# Patient Record
Sex: Female | Born: 1990 | Race: White | Hispanic: No | Smoking: Never smoker
Health system: Southern US, Community
[De-identification: ages and names within clinical notes are randomized; demographics above are authoritative.]

## PROBLEM LIST (undated history)

## (undated) ENCOUNTER — Ambulatory Visit (HOSPITAL_COMMUNITY): Admission: EM | Payer: No Typology Code available for payment source | Source: Home / Self Care

## (undated) DIAGNOSIS — N390 Urinary tract infection, site not specified: Secondary | ICD-10-CM

## (undated) DIAGNOSIS — E119 Type 2 diabetes mellitus without complications: Secondary | ICD-10-CM

## (undated) DIAGNOSIS — F419 Anxiety disorder, unspecified: Secondary | ICD-10-CM

## (undated) DIAGNOSIS — R519 Headache, unspecified: Secondary | ICD-10-CM

## (undated) DIAGNOSIS — F32A Depression, unspecified: Secondary | ICD-10-CM

## (undated) HISTORY — PX: LAPAROSCOPY: SHX197

---

## 2011-08-07 DIAGNOSIS — J302 Other seasonal allergic rhinitis: Secondary | ICD-10-CM

## 2011-08-07 HISTORY — DX: Other seasonal allergic rhinitis: J30.2

## 2020-04-13 DIAGNOSIS — S42409A Unspecified fracture of lower end of unspecified humerus, initial encounter for closed fracture: Secondary | ICD-10-CM

## 2020-04-13 HISTORY — DX: Unspecified fracture of lower end of unspecified humerus, initial encounter for closed fracture: S42.409A

## 2020-04-20 ENCOUNTER — Inpatient Hospital Stay (HOSPITAL_COMMUNITY)
Admission: EM | Admit: 2020-04-20 | Discharge: 2020-04-22 | DRG: 637 | Disposition: A | Discharge status: Home / Self Care | Payer: No Typology Code available for payment source | Attending: Family Medicine | Admitting: Family Medicine

## 2020-04-20 ENCOUNTER — Encounter (HOSPITAL_COMMUNITY): Payer: Self-pay | Admitting: *Deleted

## 2020-04-20 ENCOUNTER — Emergency Department (HOSPITAL_COMMUNITY): Payer: No Typology Code available for payment source

## 2020-04-20 ENCOUNTER — Other Ambulatory Visit: Payer: Self-pay

## 2020-04-20 DIAGNOSIS — Z91018 Allergy to other foods: Secondary | ICD-10-CM

## 2020-04-20 DIAGNOSIS — N390 Urinary tract infection, site not specified: Secondary | ICD-10-CM | POA: Diagnosis present

## 2020-04-20 DIAGNOSIS — Z794 Long term (current) use of insulin: Secondary | ICD-10-CM

## 2020-04-20 DIAGNOSIS — Z91030 Bee allergy status: Secondary | ICD-10-CM

## 2020-04-20 DIAGNOSIS — Z7984 Long term (current) use of oral hypoglycemic drugs: Secondary | ICD-10-CM

## 2020-04-20 DIAGNOSIS — Z888 Allergy status to other drugs, medicaments and biological substances status: Secondary | ICD-10-CM

## 2020-04-20 DIAGNOSIS — Z791 Long term (current) use of non-steroidal anti-inflammatories (NSAID): Secondary | ICD-10-CM

## 2020-04-20 DIAGNOSIS — U071 COVID-19: Secondary | ICD-10-CM | POA: Diagnosis present

## 2020-04-20 DIAGNOSIS — E1142 Type 2 diabetes mellitus with diabetic polyneuropathy: Secondary | ICD-10-CM | POA: Diagnosis present

## 2020-04-20 DIAGNOSIS — Z882 Allergy status to sulfonamides status: Secondary | ICD-10-CM

## 2020-04-20 DIAGNOSIS — E1110 Type 2 diabetes mellitus with ketoacidosis without coma: Secondary | ICD-10-CM | POA: Diagnosis present

## 2020-04-20 DIAGNOSIS — Z880 Allergy status to penicillin: Secondary | ICD-10-CM

## 2020-04-20 DIAGNOSIS — Z9114 Patient's other noncompliance with medication regimen: Secondary | ICD-10-CM

## 2020-04-20 DIAGNOSIS — Z79899 Other long term (current) drug therapy: Secondary | ICD-10-CM

## 2020-04-20 HISTORY — DX: Type 2 diabetes mellitus without complications: E11.9

## 2020-04-20 LAB — CBC
HCT: 44.3 % (ref 36.0–46.0)
HCT: 40.6 % (ref 36.0–46.0)
Hemoglobin: 15 g/dL (ref 12.0–15.0)
Hemoglobin: 14.2 g/dL (ref 12.0–15.0)
MCH: 28.7 pg (ref 26.0–34.0)
MCH: 29.5 pg (ref 26.0–34.0)
MCHC: 33.9 g/dL (ref 30.0–36.0)
MCHC: 35 g/dL (ref 30.0–36.0)
MCV: 84.9 fL (ref 80.0–100.0)
MCV: 84.2 fL (ref 80.0–100.0)
Platelets: 375 10*3/uL (ref 150–400)
Platelets: 345 10*3/uL (ref 150–400)
RBC: 5.22 MIL/uL — ABNORMAL HIGH (ref 3.87–5.11)
RBC: 4.82 MIL/uL (ref 3.87–5.11)
RDW: 12.7 % (ref 11.5–15.5)
RDW: 12.8 % (ref 11.5–15.5)
WBC: 22.7 10*3/uL — ABNORMAL HIGH (ref 4.0–10.5)
WBC: 21.3 10*3/uL — ABNORMAL HIGH (ref 4.0–10.5)
nRBC: 0 % (ref 0.0–0.2)
nRBC: 0 % (ref 0.0–0.2)

## 2020-04-20 LAB — BASIC METABOLIC PANEL
Anion gap: 13 (ref 5–15)
Anion gap: 13 (ref 5–15)
BUN: 8 mg/dL (ref 6–20)
BUN: 6 mg/dL (ref 6–20)
CO2: 24 mmol/L (ref 22–32)
CO2: 23 mmol/L (ref 22–32)
Calcium: 8.3 mg/dL — ABNORMAL LOW (ref 8.9–10.3)
Calcium: 8.3 mg/dL — ABNORMAL LOW (ref 8.9–10.3)
Chloride: 96 mmol/L — ABNORMAL LOW (ref 98–111)
Chloride: 100 mmol/L (ref 98–111)
Creatinine, Ser: 0.6 mg/dL (ref 0.44–1.00)
Creatinine, Ser: 0.57 mg/dL (ref 0.44–1.00)
GFR, Estimated: >60 mL/min (ref >60)
GFR, Estimated: >60 mL/min (ref >60)
Glucose, Bld: 227 mg/dL — ABNORMAL HIGH (ref 70–99)
Glucose, Bld: 198 mg/dL — ABNORMAL HIGH (ref 70–99)
Potassium: 3.4 mmol/L — ABNORMAL LOW (ref 3.5–5.1)
Potassium: 3.9 mmol/L (ref 3.5–5.1)
Sodium: 133 mmol/L — ABNORMAL LOW (ref 135–145)
Sodium: 136 mmol/L (ref 135–145)

## 2020-04-20 LAB — CBG MONITORING, ED
Glucose-Capillary: 350 mg/dL — ABNORMAL HIGH (ref 70–99)
Glucose-Capillary: 277 mg/dL — ABNORMAL HIGH (ref 70–99)
Glucose-Capillary: 218 mg/dL — ABNORMAL HIGH (ref 70–99)
Glucose-Capillary: 203 mg/dL — ABNORMAL HIGH (ref 70–99)
Glucose-Capillary: 179 mg/dL — ABNORMAL HIGH (ref 70–99)
Glucose-Capillary: 218 mg/dL — ABNORMAL HIGH (ref 70–99)
Glucose-Capillary: 214 mg/dL — ABNORMAL HIGH (ref 70–99)

## 2020-04-20 LAB — COMPREHENSIVE METABOLIC PANEL
ALT: 27 U/L (ref 0–44)
AST: 16 U/L (ref 15–41)
Albumin: 4 g/dL (ref 3.5–5.0)
Alkaline Phosphatase: 86 U/L (ref 38–126)
Anion gap: 18 — ABNORMAL HIGH (ref 5–15)
BUN: 10 mg/dL (ref 6–20)
CO2: 20 mmol/L — ABNORMAL LOW (ref 22–32)
Calcium: 8.7 mg/dL — ABNORMAL LOW (ref 8.9–10.3)
Chloride: 93 mmol/L — ABNORMAL LOW (ref 98–111)
Creatinine, Ser: 0.71 mg/dL (ref 0.44–1.00)
GFR, Estimated: >60 mL/min (ref >60)
Glucose, Bld: 407 mg/dL — ABNORMAL HIGH (ref 70–99)
Potassium: 4 mmol/L (ref 3.5–5.1)
Sodium: 131 mmol/L — ABNORMAL LOW (ref 135–145)
Total Bilirubin: 1.2 mg/dL (ref 0.3–1.2)
Total Protein: 8 g/dL (ref 6.5–8.1)

## 2020-04-20 LAB — URINALYSIS, ROUTINE W REFLEX MICROSCOPIC
Bilirubin Urine: NEGATIVE
Glucose, UA: >=500 mg/dL — AB
Ketones, ur: 80 mg/dL — AB
Leukocytes,Ua: NEGATIVE
Nitrite: NEGATIVE
Protein, ur: 100 mg/dL — AB
Specific Gravity, Urine: 1.024 (ref 1.005–1.030)
pH: 6 (ref 5.0–8.0)

## 2020-04-20 LAB — LIPASE, BLOOD: Lipase: 14 U/L (ref 11–51)

## 2020-04-20 LAB — BLOOD GAS, VENOUS
Acid-Base Excess: 0.3 mmol/L (ref 0.0–2.0)
Bicarbonate: 25.2 mmol/L (ref 20.0–28.0)
O2 Saturation: 57.7 %
Patient temperature: 98.6
pCO2, Ven: 43.7 mmHg — ABNORMAL LOW (ref 44.0–60.0)
pH, Ven: 7.379 (ref 7.250–7.430)
pO2, Ven: 32.5 mmHg (ref 32.0–45.0)

## 2020-04-20 LAB — POC SARS CORONAVIRUS 2 AG -  ED: SARS Coronavirus 2 Ag: POSITIVE — AB

## 2020-04-20 LAB — HEMOGLOBIN A1C
Hgb A1c MFr Bld: 11.7 % — ABNORMAL HIGH (ref 4.8–5.6)
Mean Plasma Glucose: 289.09 mg/dL

## 2020-04-20 LAB — I-STAT BETA HCG BLOOD, ED (MC, WL, AP ONLY): I-stat hCG, quantitative: <5 m[IU]/mL (ref <5)

## 2020-04-20 LAB — LACTIC ACID, PLASMA: Lactic Acid, Venous: 1.2 mmol/L (ref 0.5–1.9)

## 2020-04-20 LAB — BETA-HYDROXYBUTYRIC ACID
Beta-Hydroxybutyric Acid: 2.6 mmol/L — ABNORMAL HIGH (ref 0.05–0.27)
Beta-Hydroxybutyric Acid: 0.55 mmol/L — ABNORMAL HIGH (ref 0.05–0.27)

## 2020-04-20 LAB — HIV ANTIBODY (ROUTINE TESTING W REFLEX): HIV Screen 4th Generation wRfx: NONREACTIVE

## 2020-04-20 MED ORDER — POTASSIUM CHLORIDE 10 MEQ/100ML IV SOLN
10.0000 meq | INTRAVENOUS | Status: DC
  Administered 2020-04-20: 10 meq via INTRAVENOUS
  Filled 2020-04-20: qty 100

## 2020-04-20 MED ORDER — CEFTRIAXONE SODIUM 1 G IJ SOLR
1.0000 g | INTRAMUSCULAR | Status: AC
  Administered 2020-04-20 – 2020-04-22 (×3): 1 g via INTRAVENOUS
  Filled 2020-04-20 (×3): qty 10

## 2020-04-20 MED ORDER — SODIUM CHLORIDE 0.9 % IV SOLN: INTRAVENOUS | Status: DC

## 2020-04-20 MED ORDER — LACTATED RINGERS IV BOLUS: 20.0000 mL/kg | Freq: Once | INTRAVENOUS | Status: DC

## 2020-04-20 MED ORDER — INSULIN REGULAR(HUMAN) IN NACL 100-0.9 UT/100ML-% IV SOLN: INTRAVENOUS | Status: DC

## 2020-04-20 MED ORDER — SODIUM CHLORIDE 0.9 % IV SOLN
200.0000 mg | Freq: Once | INTRAVENOUS | Status: AC
  Administered 2020-04-20: 200 mg via INTRAVENOUS
  Filled 2020-04-20: qty 200

## 2020-04-20 MED ORDER — DEXTROSE 50 % IV SOLN: 0.0000 mL | INTRAVENOUS | Status: DC | PRN

## 2020-04-20 MED ORDER — LACTATED RINGERS IV SOLN: INTRAVENOUS | Status: DC

## 2020-04-20 MED ORDER — POTASSIUM CHLORIDE 10 MEQ/100ML IV SOLN: 10.0000 meq | INTRAVENOUS | Status: DC

## 2020-04-20 MED ORDER — DEXTROSE IN LACTATED RINGERS 5 % IV SOLN: INTRAVENOUS | Status: DC

## 2020-04-20 MED ORDER — SODIUM CHLORIDE 0.9 % IV SOLN
100.0000 mg | Freq: Every day | INTRAVENOUS | Status: DC
  Administered 2020-04-21 – 2020-04-22 (×2): 100 mg via INTRAVENOUS
  Filled 2020-04-20 (×3): qty 20

## 2020-04-20 MED ORDER — LACTATED RINGERS IV BOLUS
20.0000 mL/kg | Freq: Once | INTRAVENOUS | Status: AC
  Administered 2020-04-20: 1706 mL via INTRAVENOUS

## 2020-04-20 MED ORDER — INSULIN REGULAR(HUMAN) IN NACL 100-0.9 UT/100ML-% IV SOLN
INTRAVENOUS | Status: DC
  Administered 2020-04-20: 13 [IU]/h via INTRAVENOUS
  Filled 2020-04-20: qty 100

## 2020-04-20 MED ORDER — ENOXAPARIN SODIUM 40 MG/0.4ML ~~LOC~~ SOLN
40.0000 mg | SUBCUTANEOUS | Status: DC
  Administered 2020-04-20: 40 mg via SUBCUTANEOUS
  Filled 2020-04-20: qty 0.4

## 2020-04-20 MED ORDER — INSULIN DETEMIR 100 UNIT/ML ~~LOC~~ SOLN
10.0000 [IU] | Freq: Every day | SUBCUTANEOUS | Status: DC
  Administered 2020-04-20 – 2020-04-21 (×2): 10 [IU] via SUBCUTANEOUS
  Filled 2020-04-20 (×4): qty 0.1

## 2020-04-20 MED ORDER — INSULIN REGULAR HUMAN 100 UNIT/ML IJ SOLN
0.0000 [IU] | Freq: Four times a day (QID) | INTRAMUSCULAR | Status: DC
  Administered 2020-04-20: 3 [IU] via SUBCUTANEOUS
  Administered 2020-04-21 – 2020-04-22 (×3): 8 [IU] via SUBCUTANEOUS
  Administered 2020-04-22 (×2): 3 [IU] via SUBCUTANEOUS
  Filled 2020-04-20: qty 3
  Filled 2020-04-20: qty 10

## 2020-04-20 MED ORDER — POTASSIUM CHLORIDE 10 MEQ/100ML IV SOLN
10.0000 meq | INTRAVENOUS | Status: AC
  Administered 2020-04-20 (×3): 10 meq via INTRAVENOUS
  Filled 2020-04-20 (×3): qty 100

## 2020-04-20 NOTE — ED Notes (Signed)
Pt. CBG 218, RN, Junior made aware.

## 2020-04-20 NOTE — ED Notes (Signed)
Insulin drip decrease to 6.5 per endo tool guidelines

## 2020-04-20 NOTE — ED Notes (Signed)
Insulin drip decreased to 10cc/hr per endo tool guidlines

## 2020-04-20 NOTE — ED Triage Notes (Signed)
Covid + confirmed by home test. Headache, fever, chest pain emesis, unable to keep food or fluids in.

## 2020-04-20 NOTE — ED Notes (Signed)
ED Provider at bedside. 

## 2020-04-20 NOTE — ED Provider Notes (Signed)
[facility] DEPT Provider Note   CSN: [id number] Arrival date & time: 04/20/20  1008     History Chief Complaint  Patient presents with  . Chest Pain  . Emesis  . Fever    Deborah Pierce is a 30 y.o. female.  30 year old female who was diagnosed with Covid yesterday who presents with emesis and chest comfort.  States she has been short of breath.  Can keeping food down.  Patient has been currently treated for UTI as well.  Has history of type 2 diabetes and has not been taking her insulin or oral medications.  Denies any abdominal discomfort.  No urinary symptoms.  Denies any polydipsia.  Does endorse increased weakness as well as dyspnea exertion.  She is not vaccinated against Covid        Past Medical History:  Diagnosis Date  . Diabetes mellitus without complication ([place])     There are no problems to display for this patient.   Past Surgical History:  Procedure Laterality Date  . LAPAROSCOPY       OB History   No obstetric history on file.     No family history on file.  Social History   Tobacco Use  . Smoking status: Never Smoker  . Smokeless tobacco: Never Used  Substance Use Topics  . Alcohol use: Never  . Drug use: Never    Home Medications Prior to Admission medications   Not on File    Allergies    Fruit & vegetable daily [black cohosh-soyisoflav-c quad], Bee venom, Insulins, Penicillins, and Sulfa antibiotics  Review of Systems   Review of Systems  All other systems reviewed and are negative.   Physical Exam Updated Vital Signs BP 129/87 (BP Location: Left Arm)   Pulse (!) 112   Temp 99.3 F (37.4 C) (Oral)   Resp 20   Ht 1.715 m (5' 7.5")   Wt 85.3 kg   SpO2 97%   BMI 29.01 kg/m   Physical Exam Vitals and nursing note reviewed.  Constitutional:      General: She is not in acute distress.    Appearance: Normal appearance. She is well-developed and well-nourished. She is not toxic-appearing.   HENT:     Head: Normocephalic and atraumatic.  Eyes:     General: Lids are normal.     Extraocular Movements: EOM normal.     Conjunctiva/sclera: Conjunctivae normal.     Pupils: Pupils are equal, round, and reactive to light.  Neck:     Thyroid: No thyroid mass.     Trachea: No tracheal deviation.  Cardiovascular:     Rate and Rhythm: Regular rhythm. Tachycardia present.     Heart sounds: Normal heart sounds. No murmur heard. No gallop.   Pulmonary:     Effort: Pulmonary effort is normal. No respiratory distress.     Breath sounds: Normal breath sounds. No stridor. No decreased breath sounds, wheezing, rhonchi or rales.  Abdominal:     General: Bowel sounds are normal. There is no distension.     Palpations: Abdomen is soft.     Tenderness: There is no abdominal tenderness. There is no CVA tenderness or rebound.  Musculoskeletal:        General: No tenderness or edema. Normal range of motion.     Cervical back: Normal range of motion and neck supple.  Skin:    General: Skin is warm and dry.     Findings: No abrasion or rash.  Neurological:  Mental Status: She is alert and oriented to person, place, and time.     GCS: GCS eye subscore is 4. GCS verbal subscore is 5. GCS motor subscore is 6.     Cranial Nerves: No cranial nerve deficit.     Sensory: No sensory deficit.     Deep Tendon Reflexes: Strength normal.  Psychiatric:        Mood and Affect: Mood and affect normal.        Speech: Speech normal.        Behavior: Behavior normal.     ED Results / Procedures / Treatments   Labs (all labs ordered are listed, but only abnormal results are displayed) Labs Reviewed  COMPREHENSIVE METABOLIC PANEL - Abnormal; Notable for the following components:      Result Value   Sodium 131 (*)    Chloride 93 (*)    CO2 20 (*)    Glucose, Bld 407 (*)    Calcium 8.7 (*)    Anion gap 18 (*)    All other components within normal limits  CBC - Abnormal; Notable for the following  components:   WBC 22.7 (*)    RBC 5.22 (*)    All other components within normal limits  URINALYSIS, ROUTINE W REFLEX MICROSCOPIC - Abnormal; Notable for the following components:   Glucose, UA >=500 (*)    Hgb urine dipstick SMALL (*)    Ketones, ur 80 (*)    Protein, ur 100 (*)    Bacteria, UA RARE (*)    All other components within normal limits  LIPASE, BLOOD  BETA-HYDROXYBUTYRIC ACID  BETA-HYDROXYBUTYRIC ACID  BLOOD GAS, VENOUS  I-STAT BETA HCG BLOOD, ED (MC, [facility], AP ONLY)  I-STAT BETA HCG BLOOD, ED ([facility], [facility], [facility] ONLY)  CBG MONITORING, ED  POC SARS CORONAVIRUS 2 AG -  ED    EKG None  Radiology No results found.  Procedures Procedures (including critical care time)  Medications Ordered in ED Medications  lactated ringers bolus 1,706 mL (has no administration in time range)  lactated ringers infusion (has no administration in time range)  dextrose 5 % in lactated ringers infusion (has no administration in time range)  dextrose 50 % solution 0-50 mL (has no administration in time range)  potassium chloride 10 mEq in 100 mL IVPB (has no administration in time range)  cefTRIAXone (ROCEPHIN) 1 g in sodium chloride 0.9 % 100 mL IVPB (has no administration in time range)    ED Course  I have reviewed the triage vital signs and the nursing notes.  Pertinent labs & imaging results that were available during my care of the patient were reviewed by me and considered in my medical decision making (see chart for details).    MDM Rules/Calculators/A&P                          Patient's urinalysis is consistent with infection.  She has known history of UTI.  She has significant leukocytosis.  Started on Rocephin.  We will add blood cultures and lactate.  Chest x-ray without acute findings.  Patient has mild DKA and after consultation with pharmacy patient started on insulin drip.  Given IV fluids.  Will admit to the hospital service  CRITICAL CARE Performed by: Leota Jacobsen Total critical care time: 50 minutes Critical care time was exclusive of separately billable procedures and treating other patients. Critical care was necessary to treat or prevent imminent  or life-threatening deterioration. Critical care was time spent personally by me on the following activities: development of treatment plan with patient and/or surrogate as well as nursing, discussions with consultants, evaluation of patient's response to treatment, examination of patient, obtaining history from patient or surrogate, ordering and performing treatments and interventions, ordering and review of laboratory studies, ordering and review of radiographic studies, pulse oximetry and re-evaluation of patient's condition.  Final Clinical Impression(s) / ED Diagnoses Final diagnoses:  None    Rx / DC Orders ED Discharge Orders    None       Lacretia Leigh, MD 04/20/20 1457

## 2020-04-20 NOTE — H&P (Addendum)
TRH H&P    Patient Demographics:    Deborah Pierce, is a 30 y.o. female  MRN: [id number]  DOB - [birth year]  Admit Date - 04/20/2020  Referring MD/NP/PA: Lacretia Leigh   Outpatient Primary MD for the patient is Aquilla Hacker, MD  Patient coming from: Home  Chief complaint-vomiting, fever   HPI:    Deborah Pierce  is a 30 y.o. female, with history of diabetes mellitus type 2, peripheral neuropathy who came to hospital with complaints of vomiting, fever, chest discomfort.  Patient states that she started having shortness of breath yesterday.  Also complains of dysuria.  Patient had ongoing symptoms of generalized malaise and fatigue for past few days, yesterday she did at home testing for COVID-19 which came back positive.  She was seen by her PCP by televisit, and he recommended patient to take antibiotics for UTI.  She denies coughing up any phlegm.  Denies chest pain.  Complains of abdominal pain. She does not take insulin at home.  She is not on any medications for diabetes at home. Patient is not vaccinated against COVID-19. In the ED chest x-ray was unremarkable, lab work showed patient was in mild DKA with anion gap of 18. She complains of dysuria Complains of abdominal pain     Review of systems:    In addition to the HPI above,   All other systems reviewed and are negative.    Past History of the following :    Past Medical History:  Diagnosis Date  . Diabetes mellitus without complication [facility])       Past Surgical History:  Procedure Laterality Date  . LAPAROSCOPY        Social History:      Social History   Tobacco Use  . Smoking status: Never Smoker  . Smokeless tobacco: Never Used  Substance Use Topics  . Alcohol use: Never       Family History :   Family history of breast cancer on mother side Family history of lung cancer on father side   Home Medications:    Prior to Admission medications   Medication Sig Start Date End Date Taking? Authorizing Provider  acetaminophen (TYLENOL) 500 MG tablet Take 500 mg by mouth every 6 (six) hours as needed for moderate pain.   Yes [provider]  albuterol (VENTOLIN HFA) 108 (90 Base) MCG/ACT inhaler Inhale 1 puff into the lungs every 6 (six) hours as needed for wheezing or shortness of breath. 03/13/16  Yes [provider]  FLUoxetine (PROZAC) 20 MG capsule Take 20 mg by mouth daily.   Yes [provider]  gabapentin (NEURONTIN) 300 MG capsule Take 600 mg by mouth at bedtime. 03/24/20  Yes [provider]  hydrOXYzine (ATARAX/VISTARIL) 25 MG tablet Take 25 mg by mouth daily as needed for anxiety or itching.   Yes [provider]  ibuprofen (ADVIL) 200 MG tablet Take 800 mg by mouth every 6 (six) hours as needed for mild pain.   Yes [provider]  meloxicam (MOBIC) 15 MG  tablet Take 15 mg by mouth daily. 03/23/20  Yes [provider]     Allergies:     Allergies  Allergen Reactions  . Fruit & Vegetable Daily [Black Cohosh-Soyisoflav-C Quad] Anaphylaxis    Apples, oraganges, kiwi, peaches  . Penicillins Anaphylaxis    swelling  . Insulins Nausea Only and Rash    Insulin glargine, degludec, aspart    . Bee Venom Rash    unknown  . Sulfa Antibiotics Rash     Physical Exam:   Vitals  Blood pressure 102/85, pulse (!) 105, temperature 99.4 F (37.4 C), temperature source Oral, resp. rate 18, height 5' 7.5" (1.715 m), weight 85.3 kg, SpO2 95 %.  1.  General: Appears in mild distress  2. Psychiatric: Alert, oriented x3, intact insight and judgment  3. Neurologic: Cranial nerves II through XII grossly intact, no focal deficit noted  4. HEENMT:  Atraumatic normocephalic, extraocular muscles are intact  5. Respiratory : Clear to auscultation bilaterally, no wheezing or crackles auscultated  6. Cardiovascular : S1-S2, regular,  no murmur auscultated  7. Gastrointestinal:  Abdomen is soft, nontender, no organomegaly  8. Skin:  No rashes noted    Data Review:    CBC Recent Labs  Lab 04/20/20 1145  WBC 22.7*  HGB 15.0  HCT 44.3  PLT 375  MCV 84.9  MCH 28.7  MCHC 33.9  RDW 12.7   ------------------------------------------------------------------------------------------------------------------  Results for orders placed or performed during the hospital encounter of 04/20/20 (from the past 48 hour(s))  Urinalysis, Routine w reflex microscopic Urine, Clean Catch     Status: Abnormal   Collection Time: 04/20/20 10:31 AM  Result Value Ref Range   Color, Urine YELLOW YELLOW   APPearance CLEAR CLEAR   Specific Gravity, Urine 1.024 1.005 - 1.030   pH 6.0 5.0 - 8.0   Glucose, UA >=500 (A) NEGATIVE mg/dL   Hgb urine dipstick SMALL (A) NEGATIVE   Bilirubin Urine NEGATIVE NEGATIVE   Ketones, ur 80 (A) NEGATIVE mg/dL   Protein, ur 100 (A) NEGATIVE mg/dL   Nitrite NEGATIVE NEGATIVE   Leukocytes,Ua NEGATIVE NEGATIVE   RBC / HPF 0-5 0 - 5 RBC/hpf   WBC, UA 21-50 0 - 5 WBC/hpf   Bacteria, UA RARE (A) NONE SEEN   Mucus PRESENT     Comment: Performed at [facility], [place] [address], [place], [place] [address]  Lipase, blood     Status: None   Collection Time: 04/20/20 11:45 AM  Result Value Ref Range   Lipase 14 11 - 51 U/L    Comment: Performed at [facility], [place] [address] [place], [place] [address]  Comprehensive metabolic panel     Status: Abnormal   Collection Time: 04/20/20 11:45 AM  Result Value Ref Range   Sodium 131 (L) 135 - 145 mmol/L   Potassium 4.0 3.5 - 5.1 mmol/L   Chloride 93 (L) 98 - 111 mmol/L   CO2 20 (L) 22 - 32 mmol/L   Glucose, Bld 407 (H) 70 - 99 mg/dL    Comment: Glucose reference range applies only to samples taken after fasting for at least 8 hours.   BUN 10 6 - 20 mg/dL   Creatinine, Ser 0.71 0.44 - 1.00 mg/dL   Calcium 8.7 (L) 8.9 -  10.3 mg/dL   Total Protein 8.0 6.5 - 8.1 g/dL   Albumin 4.0 3.5 - 5.0 g/dL   AST 16 15 - 41 U/L   ALT 27  0 - 44 U/L   Alkaline Phosphatase 86 38 - 126 U/L   Total Bilirubin 1.2 0.3 - 1.2 mg/dL   GFR, Estimated >60 >60 mL/min    Comment: (NOTE) Calculated using the CKD-EPI Creatinine Equation (2021)    Anion gap 18 (H) 5 - 15    Comment: Performed at [facility], [place] [address], [place], [place] [address]  CBC     Status: Abnormal   Collection Time: 04/20/20 11:45 AM  Result Value Ref Range   WBC 22.7 (H) 4.0 - 10.5 K/uL   RBC 5.22 (H) 3.87 - 5.11 MIL/uL   Hemoglobin 15.0 12.0 - 15.0 g/dL   HCT 44.3 36.0 - 46.0 %   MCV 84.9 80.0 - 100.0 fL   MCH 28.7 26.0 - 34.0 pg   MCHC 33.9 30.0 - 36.0 g/dL   RDW 12.7 11.5 - 15.5 %   Platelets 375 150 - 400 K/uL   nRBC 0.0 0.0 - 0.2 %    Comment: Performed at [facility], [place] [address] [place], [place] [address]  Beta-hydroxybutyric acid     Status: Abnormal   Collection Time: 04/20/20 11:45 AM  Result Value Ref Range   Beta-Hydroxybutyric Acid 2.60 (H) 0.05 - 0.27 mmol/L    Comment: Performed at [facility], [place] [address], [place], [place] [address]  I-Stat beta hCG blood, ED     Status: None   Collection Time: 04/20/20 11:52 AM  Result Value Ref Range   I-stat hCG, quantitative <5.0 <5 mIU/mL   Comment 3            Comment:   GEST. AGE      CONC.  (mIU/mL)   <=1 WEEK        5 - 50     2 WEEKS       50 - 500     3 WEEKS       100 - 10,000     4 WEEKS     1,000 - 30,000        FEMALE AND NON-PREGNANT FEMALE:     LESS THAN 5 mIU/mL   CBG monitoring, ED     Status: Abnormal   Collection Time: 04/20/20  2:59 PM  Result Value Ref Range   Glucose-Capillary 350 (H) 70 - 99 mg/dL    Comment: Glucose reference range applies only to samples taken after fasting for at least 8 hours.   Comment 1 Notify RN    Comment 2 Document in Chart   Blood gas, venous     Status: Abnormal    Collection Time: 04/20/20  3:35 PM  Result Value Ref Range   pH, Ven 7.379 7.250 - 7.430   pCO2, Ven 43.7 (L) 44.0 - 60.0 mmHg   pO2, Ven 32.5 32.0 - 45.0 mmHg   Bicarbonate 25.2 20.0 - 28.0 mmol/L   Acid-Base Excess 0.3 0.0 - 2.0 mmol/L   O2 Saturation 57.7 %   Patient temperature 98.6     Comment: Performed at [facility], [place] [address], [place], [place] [address]  Lactic acid, plasma     Status: None   Collection Time: 04/20/20  3:35 PM  Result Value Ref Range   Lactic Acid, Venous 1.2 0.5 - 1.9 mmol/L    Comment: Performed at [facility], [place] [address], [place],  [address]  CBG monitoring, ED     Status: Abnormal   Collection Time: 04/20/20  4:01 PM  Result Value Ref Range   Glucose-Capillary 277 (H) 70 - 99 mg/dL    Comment: Glucose reference range applies only to samples taken after fasting for at least 8 hours.   Comment 1 Notify RN    Comment 2 Document in Chart     Chemistries  Recent Labs  Lab 04/20/20 1145  NA 131*  K 4.0  CL 93*  CO2 20*  GLUCOSE 407*  BUN 10  CREATININE 0.71  CALCIUM 8.7*  AST 16  ALT 27  ALKPHOS 86  BILITOT 1.2   ------------------------------------------------------------------------------------------------------------------  ------------------------------------------------------------------------------------------------------------------ GFR: Estimated Creatinine Clearance: 117.6 mL/min (by C-G formula based on SCr of 0.71 mg/dL). Liver Function Tests: Recent Labs  Lab 04/20/20 1145  AST 16  ALT 27  ALKPHOS 86  BILITOT 1.2  PROT 8.0  ALBUMIN 4.0   Recent Labs  Lab 04/20/20 1145  LIPASE 14   CBG: Recent Labs  Lab 04/20/20 1459 04/20/20 1601  GLUCAP 350* 277*    --------------------------------------------------------------------------------------------------------------- Urine analysis:    Component Value Date/Time   COLORURINE YELLOW 04/20/2020 1031    APPEARANCEUR CLEAR 04/20/2020 1031   LABSPEC 1.024 04/20/2020 1031   PHURINE 6.0 04/20/2020 1031   GLUCOSEU >=500 (A) 04/20/2020 1031   HGBUR SMALL (A) 04/20/2020 1031   BILIRUBINUR NEGATIVE 04/20/2020 1031   KETONESUR 80 (A) 04/20/2020 1031   PROTEINUR 100 (A) 04/20/2020 1031   NITRITE NEGATIVE 04/20/2020 1031   LEUKOCYTESUR NEGATIVE 04/20/2020 1031      Imaging Results:    DG Chest Port 1 View  Result Date: 04/20/2020 CLINICAL DATA:  COVID-19 positive.  Cough. EXAM: PORTABLE CHEST 1 VIEW COMPARISON:  None. FINDINGS: The heart size and mediastinal contours are within normal limits. Both lungs are clear. The visualized skeletal structures are unremarkable. IMPRESSION: No active disease. Electronically Signed   By: Dorise Bullion III M.D   On: 04/20/2020 14:20    My personal review of EKG: Rhythm NSR, no ST-T changes   Assessment & Plan:    Active Problems:   DKA (diabetic ketoacidosis) ([place])   1. Diabetic ketoacidosis-patient presented with vomiting, fever, lab work showed positive beta hydroxybutyrate, positive anion gap of 18.  Will start patient on DKA protocol, check BMP every 4 hours per protocol.  IV insulin.  We will switch IV fluids to D5 half-normal saline once blood sugar is less than 250.  Will replace potassium as needed. 2. Positive COVID-19-patient did at home test of COVID-19 which was positive, chest x-ray is unremarkable.  She is not requiring oxygen.  Repeat COVID-19 test is positive in the ED.  We will start her on remdesivir per pharmacy consultation. 3. UTI-patient was diagnosed with UTI, complaint of dysuria.  Will obtain urine culture, continue empiric ceftriaxone.  Follow urine culture results.    DVT Prophylaxis-   Lovenox   AM Labs Ordered, also please review Full Orders  Family Communication: Admission, patients condition and plan of care including tests being ordered have been discussed with the patient and  who indicate understanding and agree with  the plan and Code Status.  Code Status: Full code  Admission status: Observation  Time spent in minutes : 60 minutes   Garo Heidelberg S Ilir Mahrt M.D

## 2020-04-21 DIAGNOSIS — N390 Urinary tract infection, site not specified: Secondary | ICD-10-CM | POA: Diagnosis present

## 2020-04-21 DIAGNOSIS — Z882 Allergy status to sulfonamides status: Secondary | ICD-10-CM | POA: Diagnosis not present

## 2020-04-21 DIAGNOSIS — Z880 Allergy status to penicillin: Secondary | ICD-10-CM | POA: Diagnosis not present

## 2020-04-21 DIAGNOSIS — Z888 Allergy status to other drugs, medicaments and biological substances status: Secondary | ICD-10-CM | POA: Diagnosis not present

## 2020-04-21 DIAGNOSIS — E11649 Type 2 diabetes mellitus with hypoglycemia without coma: Secondary | ICD-10-CM | POA: Diagnosis not present

## 2020-04-21 DIAGNOSIS — E1142 Type 2 diabetes mellitus with diabetic polyneuropathy: Secondary | ICD-10-CM | POA: Diagnosis present

## 2020-04-21 DIAGNOSIS — Z91030 Bee allergy status: Secondary | ICD-10-CM | POA: Diagnosis not present

## 2020-04-21 DIAGNOSIS — Z91018 Allergy to other foods: Secondary | ICD-10-CM | POA: Diagnosis not present

## 2020-04-21 DIAGNOSIS — Z9114 Patient's other noncompliance with medication regimen: Secondary | ICD-10-CM | POA: Diagnosis not present

## 2020-04-21 DIAGNOSIS — Z794 Long term (current) use of insulin: Secondary | ICD-10-CM | POA: Diagnosis not present

## 2020-04-21 DIAGNOSIS — Z79899 Other long term (current) drug therapy: Secondary | ICD-10-CM | POA: Diagnosis not present

## 2020-04-21 DIAGNOSIS — Z791 Long term (current) use of non-steroidal anti-inflammatories (NSAID): Secondary | ICD-10-CM | POA: Diagnosis not present

## 2020-04-21 DIAGNOSIS — U071 COVID-19: Secondary | ICD-10-CM | POA: Diagnosis present

## 2020-04-21 DIAGNOSIS — E1110 Type 2 diabetes mellitus with ketoacidosis without coma: Secondary | ICD-10-CM | POA: Diagnosis present

## 2020-04-21 DIAGNOSIS — Z7984 Long term (current) use of oral hypoglycemic drugs: Secondary | ICD-10-CM | POA: Diagnosis not present

## 2020-04-21 LAB — BASIC METABOLIC PANEL
Anion gap: 13 (ref 5–15)
BUN: 8 mg/dL (ref 6–20)
CO2: 23 mmol/L (ref 22–32)
Calcium: 8.1 mg/dL — ABNORMAL LOW (ref 8.9–10.3)
Chloride: 100 mmol/L (ref 98–111)
Creatinine, Ser: 0.61 mg/dL (ref 0.44–1.00)
GFR, Estimated: >60 mL/min (ref >60)
Glucose, Bld: 332 mg/dL — ABNORMAL HIGH (ref 70–99)
Potassium: 3.8 mmol/L (ref 3.5–5.1)
Sodium: 136 mmol/L (ref 135–145)

## 2020-04-21 LAB — GLUCOSE, CAPILLARY: Glucose-Capillary: 264 mg/dL — ABNORMAL HIGH (ref 70–99)

## 2020-04-21 LAB — CBC
HCT: 38.7 % (ref 36.0–46.0)
Hemoglobin: 12.8 g/dL (ref 12.0–15.0)
MCH: 28.8 pg (ref 26.0–34.0)
MCHC: 33.1 g/dL (ref 30.0–36.0)
MCV: 87.2 fL (ref 80.0–100.0)
Platelets: 312 10*3/uL (ref 150–400)
RBC: 4.44 MIL/uL (ref 3.87–5.11)
RDW: 13 % (ref 11.5–15.5)
WBC: 14.1 10*3/uL — ABNORMAL HIGH (ref 4.0–10.5)
nRBC: 0 % (ref 0.0–0.2)

## 2020-04-21 LAB — CBG MONITORING, ED
Glucose-Capillary: 183 mg/dL — ABNORMAL HIGH (ref 70–99)
Glucose-Capillary: 251 mg/dL — ABNORMAL HIGH (ref 70–99)
Glucose-Capillary: 254 mg/dL — ABNORMAL HIGH (ref 70–99)
Glucose-Capillary: 291 mg/dL — ABNORMAL HIGH (ref 70–99)

## 2020-04-21 LAB — BETA-HYDROXYBUTYRIC ACID
Beta-Hydroxybutyric Acid: 0.57 mmol/L — ABNORMAL HIGH (ref 0.05–0.27)
Beta-Hydroxybutyric Acid: 0.77 mmol/L — ABNORMAL HIGH (ref 0.05–0.27)

## 2020-04-21 LAB — SARS CORONAVIRUS 2 (TAT 6-24 HRS): SARS Coronavirus 2: POSITIVE — AB

## 2020-04-21 MED ORDER — ONDANSETRON HCL 4 MG/2ML IJ SOLN
4.0000 mg | Freq: Three times a day (TID) | INTRAMUSCULAR | Status: DC | PRN
  Administered 2020-04-21 (×2): 4 mg via INTRAVENOUS
  Filled 2020-04-21 (×2): qty 2

## 2020-04-21 MED ORDER — ACETAMINOPHEN 325 MG PO TABS
650.0000 mg | ORAL_TABLET | Freq: Four times a day (QID) | ORAL | Status: DC | PRN
  Administered 2020-04-21 – 2020-04-22 (×6): 650 mg via ORAL
  Filled 2020-04-21 (×6): qty 2

## 2020-04-21 NOTE — Progress Notes (Signed)
[facility]  PROGRESS NOTE  Deborah Pierce TKZ:[id number] DOB: 1990/08/25 DOA: 04/20/2020 PCP: Aquilla Hacker, MD   Brief HPI:   30 year old female with medical history of diabetes mellitus type 2, peripheral neuropathy came to hospital with vomiting, fever, chest discomfort.  She was found to be positive for COVID-19.  She was also found to be in DKA started on DKA protocol with IV insulin.  She was started on IV remdesivir.  Chest x-ray was unremarkable.    Subjective   Patient seen and examined, she is off of IV insulin.  Complaint of vomiting this morning.  Started on carb modified diet.   Assessment/Plan:     1. Diabetic ketoacidosis-patient presented with DKA, was started on DKA protocol with IV insulin.  Anion gap has closed.  IV insulin has been discontinued.  Bicarb was 23 this morning.  Patient started on Levemir and sliding scale insulin with NovoLog. 2. Diabetes mellitus type 2-patient will be discharged home on Levemir 10 units subcu daily along with metformin 500 mg p.o. twice daily. 3. UTI-patient complained of dysuria, started on IV ceftriaxone.  Urine culture is currently pending.  We will follow urine culture results. 4. Positive COVID-19-patient was positive for COVID-19, she did not receive vaccine.  Started on remdesivir per pharmacy.  Chest x-ray is unremarkable.  Patient is not requiring oxygen.  O2 sats 95% on room air.     COVID-19 Labs  No results for input(s): DDIMER, FERRITIN, LDH, CRP in the last 72 hours.  Lab Results  Component Value Date   SARSCOV2NAA POSITIVE (A) 04/20/2020     Scheduled medications:   . insulin detemir  10 Units Subcutaneous QHS  . insulin regular  0-15 Units Subcutaneous Q6H         CBG: Recent Labs  Lab 04/20/20 2249 04/20/20 2354 04/21/20 0554 04/21/20 0758 04/21/20 1147  GLUCAP 214* 183* 251* 291* 254*    SpO2: 95 %    CBC: Recent Labs  Lab 04/20/20 1145 04/20/20 1653 04/21/20 1223  WBC 22.7*  21.3* 14.1*  HGB 15.0 14.2 12.8  HCT 44.3 40.6 38.7  MCV 84.9 84.2 87.2  PLT 375 345 573    Basic Metabolic Panel: Recent Labs  Lab 04/20/20 1145 04/20/20 1653 04/20/20 2050 04/21/20 0530  NA 131* 133* 136 136  K 4.0 3.4* 3.9 3.8  CL 93* 96* 100 100  CO2 20* 24 23 23   GLUCOSE 407* 227* 198* 332*  BUN 10 8 6 8   CREATININE 0.71 0.60 0.57 0.61  CALCIUM 8.7* 8.3* 8.3* 8.1*     Liver Function Tests: Recent Labs  Lab 04/20/20 1145  AST 16  ALT 27  ALKPHOS 86  BILITOT 1.2  PROT 8.0  ALBUMIN 4.0     Antibiotics: Anti-infectives (From admission, onward)   Start     Dose/Rate Route Frequency Ordered Stop   04/21/20 1000  remdesivir 100 mg in sodium chloride 0.9 % 100 mL IVPB        100 mg 200 mL/hr over 30 Minutes Intravenous Daily 04/20/20 1712 04/25/20 0959   04/20/20 1800  remdesivir 200 mg in sodium chloride 0.9% 250 mL IVPB        200 mg 580 mL/hr over 30 Minutes Intravenous Once 04/20/20 1712 04/20/20 1849   04/20/20 1400  cefTRIAXone (ROCEPHIN) 1 g in sodium chloride 0.9 % 100 mL IVPB        1 g 200 mL/hr over 30 Minutes Intravenous Every 24 hours 04/20/20 1356  DVT prophylaxis: Lovenox  Code Status: Full code  Family Communication: No family at bedside   Consultants:    Procedures:      Objective   Vitals:   04/21/20 1030 04/21/20 1130 04/21/20 1200 04/21/20 1420  BP: (!) 148/91 139/90 (!) 142/93 139/85  Pulse: 95 89 87 82  Resp: 18  18 18   Temp:      TempSrc:      SpO2: 95%  95% 95%  Weight:      Height:        Intake/Output Summary (Last 24 hours) at 04/21/2020 1508 Last data filed at 04/21/2020 1345 Gross per 24 hour  Intake 173.69 ml  Output 1000 ml  Net -826.31 ml    01/07 1901 - 01/09 0700 In: 273.7 [I.V.:73.7] Out: -   Filed Weights   04/20/20 1028  Weight: 85.3 kg    Physical Examination:    General-appears in no acute distress  Heart-S1-S2, regular, no murmur auscultated  Lungs-clear to  auscultation bilaterally, no wheezing or crackles auscultated  Abdomen-soft, nontender, no organomegaly  Extremities-no edema in the lower extremities  Neuro-alert, oriented x3, no focal deficit noted   Status is: Inpatient  Dispo: The patient is from: Home              Anticipated d/c is to: Home              Anticipated d/c date is: 04/22/2020              Patient currently not stable for discharge  Barrier to discharge-awaiting urine culture results            Data Reviewed:   Recent Results (from the past 240 hour(s))  SARS CORONAVIRUS 2 (TAT 6-24 HRS) Nasopharyngeal Nasopharyngeal Swab     Status: Abnormal   Collection Time: 04/20/20  8:54 PM   Specimen: Nasopharyngeal Swab  Result Value Ref Range Status   SARS Coronavirus 2 POSITIVE (A) NEGATIVE Final    Comment: (NOTE) SARS-CoV-2 target nucleic acids are DETECTED.  The SARS-CoV-2 RNA is generally detectable in upper and lower respiratory specimens during the acute phase of infection. Positive results are indicative of the presence of SARS-CoV-2 RNA. Clinical correlation with patient history and other diagnostic information is  necessary to determine patient infection status. Positive results do not rule out bacterial infection or co-infection with other viruses.  The expected result is Negative.  Fact Sheet for Patients: SugarRoll.be  Fact Sheet for Healthcare Providers: https://www.woods-mathews.com/  This test is not yet approved or cleared by the [place] FDA and  has been authorized for detection and/or diagnosis of SARS-CoV-2 by FDA under an Emergency Use Authorization (EUA). This EUA will remain  in effect (meaning this test can be used) for the duration of the COVID-19 declaration under Section 564(b)(1) of the Act, 21 U. S.C. section 360bbb-3(b)(1), unless the authorization is terminated or revoked sooner.   Performed at [facility],  [place] [address] [place], [place] [address]     Recent Labs  Lab 04/20/20 1145  LIPASE 14    Studies:  DG Chest Port 1 View  Result Date: 04/20/2020 CLINICAL DATA:  COVID-19 positive.  Cough. EXAM: PORTABLE CHEST 1 VIEW COMPARISON:  None. FINDINGS: The heart size and mediastinal contours are within normal limits. Both lungs are clear. The visualized skeletal structures are unremarkable. IMPRESSION: No active disease. Electronically Signed   By: Dorise Bullion III M.D   On: 04/20/2020 14:20  Meredeth Ide   [facility] If 7PM-7AM, please contact night-coverage at [email or web address], Office  [phone]   04/21/2020, 3:08 PM  LOS: 0 days

## 2020-04-22 DIAGNOSIS — E11649 Type 2 diabetes mellitus with hypoglycemia without coma: Secondary | ICD-10-CM | POA: Diagnosis not present

## 2020-04-22 DIAGNOSIS — Z794 Long term (current) use of insulin: Secondary | ICD-10-CM

## 2020-04-22 DIAGNOSIS — E1110 Type 2 diabetes mellitus with ketoacidosis without coma: Secondary | ICD-10-CM | POA: Diagnosis not present

## 2020-04-22 LAB — GLUCOSE, CAPILLARY
Glucose-Capillary: 193 mg/dL — ABNORMAL HIGH (ref 70–99)
Glucose-Capillary: 196 mg/dL — ABNORMAL HIGH (ref 70–99)
Glucose-Capillary: 267 mg/dL — ABNORMAL HIGH (ref 70–99)

## 2020-04-22 LAB — URINE CULTURE

## 2020-04-22 LAB — BASIC METABOLIC PANEL
Anion gap: 13 (ref 5–15)
BUN: 9 mg/dL (ref 6–20)
CO2: 25 mmol/L (ref 22–32)
Calcium: 8.5 mg/dL — ABNORMAL LOW (ref 8.9–10.3)
Chloride: 102 mmol/L (ref 98–111)
Creatinine, Ser: 0.56 mg/dL (ref 0.44–1.00)
GFR, Estimated: >60 mL/min (ref >60)
Glucose, Bld: 304 mg/dL — ABNORMAL HIGH (ref 70–99)
Potassium: 3.5 mmol/L (ref 3.5–5.1)
Sodium: 140 mmol/L (ref 135–145)

## 2020-04-22 MED ORDER — INSULIN ASPART 100 UNIT/ML FLEXPEN: PEN_INJECTOR | SUBCUTANEOUS | 11 refills | Status: DC

## 2020-04-22 MED ORDER — BLOOD GLUCOSE MONITOR KIT
PACK | 0 refills | Status: DC
Start: 2020-04-22 — End: 2022-05-31

## 2020-04-22 MED ORDER — INSULIN DETEMIR 100 UNIT/ML FLEXPEN: 15.0000 [IU] | PEN_INJECTOR | Freq: Every day | SUBCUTANEOUS | 2 refills | Status: DC

## 2020-04-22 MED ORDER — METFORMIN HCL 500 MG PO TABS: 500.0000 mg | ORAL_TABLET | Freq: Two times a day (BID) | ORAL | 11 refills | Status: DC

## 2020-04-22 MED ORDER — "INSULIN SYRINGE 31G X 5/16"" 0.5 ML MISC": 0 refills | Status: DC

## 2020-04-22 NOTE — Discharge Instructions (Signed)
Home isolation till 04/29/2020.   COVID-19 Quarantine vs. Isolation QUARANTINE keeps someone who was in close contact with someone who has COVID-19 away from others. Quarantine if you have been in close contact with someone who has COVID-19, unless you have been fully vaccinated. If you are fully vaccinated  You do NOT need to quarantine unless they have symptoms  Get tested 3-5 days after your exposure, even if you don't have symptoms  Wear a mask indoors in public for 14 days following exposure or until your test result is negative If you are not fully vaccinated  Stay home for 14 days after your last contact with a person who has COVID-19  Watch for fever (100.9F), cough, shortness of breath, or other symptoms of COVID-19  If possible, stay away from people you live with, especially people who are at higher risk for getting very sick from COVID-19  Contact your local public health department for options in your area to possibly shorten your quarantine ISOLATION keeps someone who is sick or tested positive for COVID-19 without symptoms away from others, even in their own home. People who are in isolation should stay home and stay in a specific "sick room" or area and use a separate bathroom (if available). If you are sick and think or know you have COVID-19 Stay home until after  At least 10 days since symptoms first appeared and  At least 24 hours with no fever without the use of fever-reducing medications and  Symptoms have improved If you tested positive for COVID-19 but do not have symptoms  Stay home until after 10 days have passed since your positive viral test  If you develop symptoms after testing positive, follow the steps above for those who are sick [email or web address] 01/08/2020 This information is not intended to replace advice given to you by your health care provider. Make sure you discuss any questions you have with your health care provider. Document Revised:  02/12/2020 Document Reviewed: 02/12/2020 Elsevier Patient Education  2021 [employer].

## 2020-04-22 NOTE — Discharge Summary (Addendum)
Physician Discharge Summary  Deborah Pierce WNI:[id number] DOB: 1991/03/10 DOA: 04/20/2020  PCP: Aquilla Hacker, MD  Admit date: 04/20/2020 Discharge date: 04/22/2020  Time spent: 50 minutes  Recommendations for Outpatient Follow-up:  1. Follow-up PCP in 2 weeks 2. Continue home isolation till 04/29/2020  Discharge Diagnoses:  Active Problems:   DKA (diabetic ketoacidosis) ([place]) Uncontrolled diabetes mellitus type 2    Discharge Condition: Stable  Diet recommendation: Stable  Filed Weights   04/20/20 1028  Weight: 85.3 kg    History of present illness:  30 year old female with medical history of diabetes mellitus type 2, peripheral neuropathy came to hospital with vomiting, fever, chest discomfort.  She was found to be positive for COVID-19.  She was also found to be in DKA started on DKA protocol with IV insulin.  She was started on IV remdesivir.  Chest x-ray was unremarkable.   Hospital Course:   1. Diabetic ketoacidosis-patient presented with DKA, was started on DKA protocol with IV insulin.  Anion gap has closed.  IV insulin has been discontinued.  Bicarb was 23 this morning.  Patient started on Levemir and sliding scale insulin with NovoLog.   2. Diabetes mellitus type 2-patient will be discharged home on Levemir 15 units subcu daily along with metformin 500 mg p.o. twice daily.  We will start NovoLog moderate sliding scale. 3.  ? UTI-patient complained of dysuria, started on IV ceftriaxone.  Urine culture grew multiple species.  Patient received 3 days of antibiotics in the hospital.  Will not discharge on antibiotics at this time.  4. Positive COVID-19-patient was positive for COVID-19, she did not receive vaccine.  Started on remdesivir per pharmacy.  Chest x-ray is unremarkable.  Patient is not requiring oxygen.  O2 sats 95% on room air.  Patient will be discharged home without any steroids.  She has received remdesivir x3 doses in the  hospital.   Procedures:    Consultations:    Discharge Exam: Vitals:   04/22/20 0619 04/22/20 1252  BP: 123/83 135/88  Pulse: 77 76  Resp: 15 16  Temp: 98.2 F (36.8 C) 98.5 F (36.9 C)  SpO2: 96% 95%    General: Appears in no acute distress Cardiovascular: S1-S2, regular Respiratory: Clear to auscultation bilaterally  Discharge Instructions   Discharge Instructions    Diet - low sodium heart healthy   Complete by: As directed    Increase activity slowly   Complete by: As directed      Allergies as of 04/22/2020      Reactions   Fruit & Vegetable Daily [black Cohosh-soyisoflav-c Quad] Anaphylaxis   Apples, oraganges, kiwi, peaches   Penicillins Anaphylaxis   swelling   Insulins Nausea Only, Rash   Insulin glargine, degludec, aspart   Bee Venom Rash   unknown   Sulfa Antibiotics Rash      Medication List    STOP taking these medications   ibuprofen 200 MG tablet Commonly known as: ADVIL   meloxicam 15 MG tablet Commonly known as: MOBIC     TAKE these medications   acetaminophen 500 MG tablet Commonly known as: TYLENOL Take 500 mg by mouth every 6 (six) hours as needed for moderate pain.   albuterol 108 (90 Base) MCG/ACT inhaler Commonly known as: VENTOLIN HFA Inhale 1 puff into the lungs every 6 (six) hours as needed for wheezing or shortness of breath.   blood glucose meter kit and supplies Kit Dispense based on patient and insurance preference. Use up to four times daily  as directed. (FOR ICD-9 250.00, 250.01).   FLUoxetine 20 MG capsule Commonly known as: PROZAC Take 20 mg by mouth daily.   gabapentin 300 MG capsule Commonly known as: NEURONTIN Take 600 mg by mouth at bedtime.   hydrOXYzine 25 MG tablet Commonly known as: ATARAX/VISTARIL Take 25 mg by mouth daily as needed for anxiety or itching.   insulin aspart 100 UNIT/ML FlexPen Commonly known as: NOVOLOG Sliding scale insulin Less than 70 initiate hypoglycemia protocol  70-120  0 units 120-150 2 units 151-200 3 units 201-250 5units 251-300 8 units 301-350 11 units 351-400 15 units Greater than 400 call MD   insulin detemir 100 UNIT/ML FlexPen Commonly known as: LEVEMIR Inject 15 Units into the skin daily.   INSULIN SYRINGE .5CC/31GX5/16" 31G X 5/16" 0.5 ML Misc Use as directed   metFORMIN 500 MG tablet Commonly known as: Glucophage Take 1 tablet (500 mg total) by mouth 2 (two) times daily with a meal.      Allergies  Allergen Reactions  . Fruit & Vegetable Daily [Black Cohosh-Soyisoflav-C Quad] Anaphylaxis    Apples, oraganges, kiwi, peaches  . Penicillins Anaphylaxis    swelling  . Insulins Nausea Only and Rash    Insulin glargine, degludec, aspart    . Bee Venom Rash    unknown  . Sulfa Antibiotics Rash      The results of significant diagnostics from this hospitalization (including imaging, microbiology, ancillary and laboratory) are listed below for reference.    Significant Diagnostic Studies: DG Chest Port 1 View  Result Date: 04/20/2020 CLINICAL DATA:  COVID-19 positive.  Cough. EXAM: PORTABLE CHEST 1 VIEW COMPARISON:  None. FINDINGS: The heart size and mediastinal contours are within normal limits. Both lungs are clear. The visualized skeletal structures are unremarkable. IMPRESSION: No active disease. Electronically Signed   By: Gerome Sam III M.D   On: 04/20/2020 14:20    Microbiology: Recent Results (from the past 240 hour(s))  Culture, Urine     Status: Abnormal   Collection Time: 04/20/20  4:53 PM   Specimen: Urine, Random  Result Value Ref Range Status   Specimen Description   Final    URINE, RANDOM Performed at [facility], [address] [place], [place] [address]    Special Requests   Final    NONE Performed at [facility], [address] [place], [place] [address]    Culture MULTIPLE SPECIES PRESENT, SUGGEST RECOLLECTION (A)  Final   Report Status 04/22/2020 FINAL   Final  SARS CORONAVIRUS 2 (TAT 6-24 HRS) Nasopharyngeal Nasopharyngeal Swab     Status: Abnormal   Collection Time: 04/20/20  8:54 PM   Specimen: Nasopharyngeal Swab  Result Value Ref Range Status   SARS Coronavirus 2 POSITIVE (A) NEGATIVE Final    Comment: (NOTE) SARS-CoV-2 target nucleic acids are DETECTED.  The SARS-CoV-2 RNA is generally detectable in upper and lower respiratory specimens during the acute phase of infection. Positive results are indicative of the presence of SARS-CoV-2 RNA. Clinical correlation with patient history and other diagnostic information is  necessary to determine patient infection status. Positive results do not rule out bacterial infection or co-infection with other viruses.  The expected result is Negative.  Fact Sheet for Patients: HairSlick.no  Fact Sheet for Healthcare Providers: quierodirigir.com  This test is not yet approved or cleared by the [place] FDA and  has been authorized for detection and/or diagnosis of SARS-CoV-2 by FDA under an Emergency Use Authorization (EUA). This  EUA will remain  in effect (meaning this test can be used) for the duration of the COVID-19 declaration under Section 564(b)(1) of the Act, 21 U. S.C. section 360bbb-3(b)(1), unless the authorization is terminated or revoked sooner.   Performed at [facility], [place] [address] [place], [place] [address]      Labs: Basic Metabolic Panel: Recent Labs  Lab 04/20/20 1145 04/20/20 1653 04/20/20 2050 04/21/20 0530 04/22/20 1057  NA 131* 133* 136 136 140  K 4.0 3.4* 3.9 3.8 3.5  CL 93* 96* 100 100 102  CO2 20* $Remov'24 23 23 25  'VRxsel$ GLUCOSE 407* 227* 198* 332* 304*  BUN $Re'10 8 6 8 9  'ObM$ CREATININE 0.71 0.60 0.57 0.61 0.56  CALCIUM 8.7* 8.3* 8.3* 8.1* 8.5*   Liver Function Tests: Recent Labs  Lab 04/20/20 1145  AST 16  ALT 27  ALKPHOS 86  BILITOT 1.2  PROT 8.0  ALBUMIN 4.0   Recent Labs  Lab  04/20/20 1145  LIPASE 14   No results for input(s): AMMONIA in the last 168 hours. CBC: Recent Labs  Lab 04/20/20 1145 04/20/20 1653 04/21/20 1223  WBC 22.7* 21.3* 14.1*  HGB 15.0 14.2 12.8  HCT 44.3 40.6 38.7  MCV 84.9 84.2 87.2  PLT 375 345 312    CBG: Recent Labs  Lab 04/21/20 1147 04/21/20 1806 04/22/20 0021 04/22/20 0621 04/22/20 1219  GLUCAP 254* 264* 196* 193* 267*       Signed:  Oswald Hillock MD.  [facility] 04/22/2020, 2:18 PM

## 2020-04-22 NOTE — Progress Notes (Signed)
Inpatient Diabetes Program Recommendations  AACE/ADA: New Consensus Statement on Inpatient Glycemic Control (2015)  Target Ranges:  Prepandial:   less than 140 mg/dL      Peak postprandial:   less than 180 mg/dL (1-2 hours)      Critically ill patients:  140 - 180 mg/dL   Lab Results  Component Value Date   GLUCAP 267 (H) 04/22/2020   HGBA1C 11.7 (H) 04/20/2020    Review of Glycemic Control  Diabetes history: DM2 Outpatient Diabetes medications: None at present. Has been on Levemir and Novolog in the past Current orders for Inpatient glycemic control: Levemir 10 units QHS, Novolin R 0-15 units Q6H  HgbA1C - 11.7%  Inpatient Diabetes Program Recommendations:     For discharge:  Levemir pen15-20 units QHS Novolog pen 8-10 units tidac  Will need insulin pen needles (#[id number]) Pt has meter at home.  Long conversation regarding her diabetes control, HgbA1C of 11.7% and importance of lifestyle modification with diet, weight loss and physical activity. Discussed hypoglycemia s/s and treatment. Pt has had diabetes education in the past. Will f/u with her PCP for diabetes management.   Discussed above with MD, RN.  Thank you. Lorenda Peck, RD, LDN, CDE Inpatient Diabetes Coordinator [phone]

## 2020-04-22 NOTE — Plan of Care (Signed)

## 2020-06-17 ENCOUNTER — Encounter: Payer: Self-pay | Admitting: Internal Medicine

## 2020-10-10 ENCOUNTER — Ambulatory Visit (HOSPITAL_COMMUNITY)
Admission: EM | Admit: 2020-10-10 | Discharge: 2020-10-10 | Disposition: A | Discharge status: Home / Self Care | Payer: No Typology Code available for payment source

## 2020-10-10 ENCOUNTER — Other Ambulatory Visit: Payer: Self-pay

## 2020-10-10 ENCOUNTER — Ambulatory Visit (INDEPENDENT_AMBULATORY_CARE_PROVIDER_SITE_OTHER): Payer: No Typology Code available for payment source

## 2020-10-10 ENCOUNTER — Encounter (HOSPITAL_COMMUNITY): Payer: Self-pay

## 2020-10-10 DIAGNOSIS — W19XXXA Unspecified fall, initial encounter: Secondary | ICD-10-CM

## 2020-10-10 DIAGNOSIS — M25521 Pain in right elbow: Secondary | ICD-10-CM | POA: Diagnosis not present

## 2020-10-10 DIAGNOSIS — M25421 Effusion, right elbow: Secondary | ICD-10-CM | POA: Diagnosis not present

## 2020-10-10 DIAGNOSIS — S52124A Nondisplaced fracture of head of right radius, initial encounter for closed fracture: Secondary | ICD-10-CM

## 2020-10-10 DIAGNOSIS — Z794 Long term (current) use of insulin: Secondary | ICD-10-CM

## 2020-10-10 DIAGNOSIS — M25561 Pain in right knee: Secondary | ICD-10-CM

## 2020-10-10 DIAGNOSIS — M25562 Pain in left knee: Secondary | ICD-10-CM

## 2020-10-10 DIAGNOSIS — E118 Type 2 diabetes mellitus with unspecified complications: Secondary | ICD-10-CM

## 2020-10-10 MED ORDER — NAPROXEN 500 MG PO TABS: 500.0000 mg | ORAL_TABLET | Freq: Two times a day (BID) | ORAL | 0 refills | Status: DC

## 2020-10-10 MED ORDER — BACITRACIN ZINC 500 UNIT/GM EX OINT: TOPICAL_OINTMENT | Freq: Two times a day (BID) | CUTANEOUS | Status: DC

## 2020-10-10 MED ORDER — KETOROLAC TROMETHAMINE 60 MG/2ML IM SOLN
INTRAMUSCULAR | Status: AC
  Filled 2020-10-10: qty 2

## 2020-10-10 MED ORDER — HYDROCODONE-ACETAMINOPHEN 5-325 MG PO TABS: 1.0000 | ORAL_TABLET | Freq: Four times a day (QID) | ORAL | 0 refills | Status: DC | PRN

## 2020-10-10 MED ORDER — KETOROLAC TROMETHAMINE 60 MG/2ML IM SOLN
60.0000 mg | Freq: Once | INTRAMUSCULAR | Status: AC
Start: 2020-10-10 — End: 2020-10-10
  Administered 2020-10-10: 20:00:00 60 mg via INTRAMUSCULAR

## 2020-10-10 NOTE — ED Triage Notes (Addendum)
Pt at grocery store, turned to get cart and fell to ground. Pt states she tried to catch self with right arm and now she is not able to bend arm, she is able to move at shoulder, wrist, and fingers. Brachial pulse +2, radial pulse +2, arm warm to touch, fingers and FA swollen to right side, fingers to right slightly cyanotic. Patient denies numbness and tingling to rt arm. Abrasions to bilateral Lower extremities. PA made aware of patients condition.  Patient has not taken any interventions.

## 2020-10-10 NOTE — Discharge Instructions (Addendum)
Please call Dr. Tama Headings office for follow-up on your right elbow fracture.  In the meantime do not take off the splint. Please schedule naproxen twice daily with food for your severe pain.  If you still have pain despite taking naproxen regularly, this is breakthrough pain.  You can use hydrocodone, a narcotic pain medicine, once every 4-6 hours for this.  Once your pain is better controlled, switch back to just naproxen.

## 2020-10-10 NOTE — ED Triage Notes (Addendum)
Bilateral lower extremities wrapped with coban (to cover abrasion) and bacitracin applied.

## 2020-10-10 NOTE — ED Notes (Signed)
Called ortho tech 

## 2020-10-10 NOTE — ED Provider Notes (Addendum)
[place]   MRN: [id number] DOB: [birth year]  Subjective:   Deborah Pierce is a 30 y.o. female presenting for suffering a fall while at a grocery store today.  As she turned to get the car, she accidentally fell to the ground observing the initial impact with her left knee and then her right elbow.  She has since had worsening right elbow pain with decreased range of motion.  She also has milder bilateral knee pain, right hand pain.  Denies head injury, loss of consciousness, confusion, weakness, numbness or tingling.  She is a diabetic on insulin.  Has not taken any medications for pain.  No history of CKD.  No current facility-administered medications for this encounter.  Current Outpatient Medications:    metFORMIN (GLUCOPHAGE) 500 MG tablet, Take by mouth., Disp: , Rfl:    acetaminophen (TYLENOL) 500 MG tablet, Take 500 mg by mouth every 6 (six) hours as needed for moderate pain., Disp: , Rfl:    albuterol (VENTOLIN HFA) 108 (90 Base) MCG/ACT inhaler, Inhale 1 puff into the lungs every 6 (six) hours as needed for wheezing or shortness of breath., Disp: , Rfl:    blood glucose meter kit and supplies KIT, Dispense based on patient and insurance preference. Use up to four times daily as directed. (FOR ICD-9 250.00, 250.01)., Disp: 1 each, Rfl: 0   Continuous Blood Gluc Receiver (DEXCOM G6 RECEIVER) DEVI, ONE EACH BY DOES NOT APPLY ROUTE ONCE FOR 1 DOSE., Disp: , Rfl:    FLUoxetine (PROZAC) 20 MG capsule, Take 20 mg by mouth daily., Disp: , Rfl:    gabapentin (NEURONTIN) 300 MG capsule, Take 600 mg by mouth at bedtime., Disp: , Rfl:    hydrOXYzine (ATARAX/VISTARIL) 25 MG tablet, Take 25 mg by mouth daily as needed for anxiety or itching., Disp: , Rfl:    insulin aspart (NOVOLOG) 100 UNIT/ML FlexPen, Sliding scale insulin Less than 70 initiate hypoglycemia protocol 70-120  0 units 120-150 2 units 151-200 3 units 201-250 5units 251-300 8 units 301-350 11 units 351-400 15 units  Greater than 400 call MD, Disp: 15 mL, Rfl: 11   insulin detemir (LEVEMIR) 100 UNIT/ML FlexPen, Inject 15 Units into the skin daily., Disp: 15 mL, Rfl: 2   Insulin Syringe-Needle U-100 (INSULIN SYRINGE .5CC/31GX5/16") 31G X 5/16" 0.5 ML MISC, Use as directed, Disp: 100 each, Rfl: 0   meloxicam (MOBIC) 15 MG tablet, Take 15 mg by mouth daily., Disp: , Rfl:    metFORMIN (GLUCOPHAGE) 500 MG tablet, Take 1 tablet (500 mg total) by mouth 2 (two) times daily with a meal., Disp: 60 tablet, Rfl: 11   promethazine-dextromethorphan (PROMETHAZINE-DM) 6.25-15 MG/5ML syrup, Take by mouth., Disp: , Rfl:    Allergies  Allergen Reactions   Fruit & Vegetable Daily [Black Cohosh-Soyisoflav-C Quad] Anaphylaxis    Apples, oraganges, kiwi, peaches   Penicillins Anaphylaxis    swelling   Insulins Nausea Only and Rash    Insulin glargine, degludec, aspart     Bee Venom Rash    unknown   Sulfa Antibiotics Rash    Past Medical History:  Diagnosis Date   Diabetes mellitus without complication ([place])      Past Surgical History:  Procedure Laterality Date   LAPAROSCOPY      No family history on file.  Social History   Tobacco Use   Smoking status: Never   Smokeless tobacco: Never  Substance Use Topics   Alcohol use: Never   Drug use: Never  ROS   Objective:   Vitals: BP (!) 141/82 (BP Location: Left Arm)   Pulse 92   Temp 98.4 F (36.9 C) (Oral)   Resp 16   LMP 09/14/2020   SpO2 99%   Physical Exam Constitutional:      General: She is not in acute distress.    Appearance: Normal appearance. She is well-developed. She is obese. She is not ill-appearing, toxic-appearing or diaphoretic.  HENT:     Head: Normocephalic and atraumatic.     Right Ear: External ear normal.     Left Ear: External ear normal.     Nose: Nose normal.     Mouth/Throat:     Mouth: Mucous membranes are moist.  Eyes:     General: No scleral icterus.       Right eye: No discharge.        Left eye: No  discharge.     Extraocular Movements: Extraocular movements intact.     Conjunctiva/sclera: Conjunctivae normal.     Pupils: Pupils are equal, round, and reactive to light.  Cardiovascular:     Rate and Rhythm: Normal rate.  Pulmonary:     Effort: Pulmonary effort is normal.  Musculoskeletal:     Comments: Decreased range of motion with exquisite tenderness of the right elbow.  Trace swelling of the right hand.  No tenderness for the wrist, forearm, right hand, right shoulder.  Full range of motion except for the right elbow.  Full range of motion of both lower extremities.  Two anterior abrasions over either knee.  Strength 5/5 for lower extremities.  Skin:    General: Skin is warm and dry.  Neurological:     General: No focal deficit present.     Mental Status: She is alert and oriented to person, place, and time.     Motor: No weakness.     Coordination: Coordination normal.     Gait: Gait normal.     Deep Tendon Reflexes: Reflexes normal.  Psychiatric:        Mood and Affect: Mood normal.        Behavior: Behavior normal.        Thought Content: Thought content normal.        Judgment: Judgment normal.    DG Elbow Complete Right  Result Date: 10/10/2020 CLINICAL DATA:  Fall, right elbow pain EXAM: RIGHT ELBOW - COMPLETE 3+ VIEW COMPARISON:  None. FINDINGS: There is a fracture through the right radial head. Associated joint effusion. No subluxation or dislocation. IMPRESSION: Right radial head fracture. Electronically Signed   By: Rolm Baptise M.D.   On: 10/10/2020 19:51     Assessment and Plan :   PDMP not reviewed this encounter.  1. Closed nondisplaced fracture of head of right radius, initial encounter   2. Right elbow pain   3. Acute bilateral knee pain   4. Accidental fall, initial encounter   5. Diabetes mellitus treated with insulin ([place])   6. Elbow effusion, right     Patient is to be placed on posterior long arm splint. Start naproxen for pain control,  hydrocodone for breakthrough pain. Follow up with Dr. Doreatha Martin as soon as possible. Wound care performed. Counseled patient on potential for adverse effects with medications prescribed/recommended today, ER and return-to-clinic precautions discussed, patient verbalized understanding.   Jaynee Eagles, PA-C 10/10/20 2001

## 2020-10-10 NOTE — Progress Notes (Signed)
Orthopedic Tech Progress Note Patient Details:  Deborah Pierce 1990-12-05 [id number]  Ortho Devices Type of Ortho Device: Post (long arm) splint, Arm sling Ortho Device/Splint Location: RUE Ortho Device/Splint Interventions: Ordered, Application, Adjustment   Post Interventions Patient Tolerated: Well Instructions Provided: Care of device, Poper ambulation with device  Deborah Pierce 10/10/2020, 8:49 PM

## 2021-08-04 ENCOUNTER — Ambulatory Visit: Payer: No Typology Code available for payment source | Admitting: Family

## 2021-08-04 ENCOUNTER — Ambulatory Visit: Payer: No Typology Code available for payment source | Admitting: Adult Health

## 2021-08-12 ENCOUNTER — Encounter: Payer: Self-pay | Admitting: Family

## 2021-08-12 ENCOUNTER — Ambulatory Visit (INDEPENDENT_AMBULATORY_CARE_PROVIDER_SITE_OTHER): Payer: No Typology Code available for payment source | Admitting: Family

## 2021-08-12 VITALS — BP 116/66 | HR 81 | Temp 99.2°F | Resp 16 | Ht 67.5 in | Wt 221.3 lb

## 2021-08-12 DIAGNOSIS — E1142 Type 2 diabetes mellitus with diabetic polyneuropathy: Secondary | ICD-10-CM | POA: Diagnosis not present

## 2021-08-12 DIAGNOSIS — Z6834 Body mass index (BMI) 34.0-34.9, adult: Secondary | ICD-10-CM

## 2021-08-12 DIAGNOSIS — Z794 Long term (current) use of insulin: Secondary | ICD-10-CM

## 2021-08-12 DIAGNOSIS — R5383 Other fatigue: Secondary | ICD-10-CM

## 2021-08-12 DIAGNOSIS — F418 Other specified anxiety disorders: Secondary | ICD-10-CM

## 2021-08-12 DIAGNOSIS — Z13220 Encounter for screening for lipoid disorders: Secondary | ICD-10-CM | POA: Insufficient documentation

## 2021-08-12 DIAGNOSIS — E119 Type 2 diabetes mellitus without complications: Secondary | ICD-10-CM | POA: Insufficient documentation

## 2021-08-12 DIAGNOSIS — Z1283 Encounter for screening for malignant neoplasm of skin: Secondary | ICD-10-CM | POA: Insufficient documentation

## 2021-08-12 DIAGNOSIS — E6609 Other obesity due to excess calories: Secondary | ICD-10-CM

## 2021-08-12 HISTORY — DX: Other fatigue: R53.83

## 2021-08-12 LAB — COMPREHENSIVE METABOLIC PANEL
ALT: 21 U/L (ref 0–35)
AST: 13 U/L (ref 0–37)
Albumin: 4.4 g/dL (ref 3.5–5.2)
Alkaline Phosphatase: 70 U/L (ref 39–117)
BUN: 13 mg/dL (ref 6–23)
CO2: 30 mEq/L (ref 19–32)
Calcium: 9 mg/dL (ref 8.4–10.5)
Chloride: 99 mEq/L (ref 96–112)
Creatinine, Ser: 0.81 mg/dL (ref 0.40–1.20)
GFR: 97.44 mL/min (ref >60.00)
Glucose, Bld: 159 mg/dL — ABNORMAL HIGH (ref 70–99)
Potassium: 4.4 mEq/L (ref 3.5–5.1)
Sodium: 137 mEq/L (ref 135–145)
Total Bilirubin: 0.7 mg/dL (ref 0.2–1.2)
Total Protein: 7.1 g/dL (ref 6.0–8.3)

## 2021-08-12 LAB — LIPID PANEL
Cholesterol: 187 mg/dL (ref 0–200)
HDL: 41.4 mg/dL (ref >39.00)
NonHDL: 145.8
Total CHOL/HDL Ratio: 5
Triglycerides: 228 mg/dL — ABNORMAL HIGH (ref 0.0–149.0)
VLDL: 45.6 mg/dL — ABNORMAL HIGH (ref 0.0–40.0)

## 2021-08-12 LAB — CBC WITH DIFFERENTIAL/PLATELET
Basophils Absolute: 0.1 10*3/uL (ref 0.0–0.1)
Basophils Relative: 0.7 % (ref 0.0–3.0)
Eosinophils Absolute: 0.4 10*3/uL (ref 0.0–0.7)
Eosinophils Relative: 3.9 % (ref 0.0–5.0)
HCT: 41.9 % (ref 36.0–46.0)
Hemoglobin: 14.1 g/dL (ref 12.0–15.0)
Lymphocytes Relative: 27.7 % (ref 12.0–46.0)
Lymphs Abs: 3 10*3/uL (ref 0.7–4.0)
MCHC: 33.8 g/dL (ref 30.0–36.0)
MCV: 85 fl (ref 78.0–100.0)
Monocytes Absolute: 0.6 10*3/uL (ref 0.1–1.0)
Monocytes Relative: 6 % (ref 3.0–12.0)
Neutro Abs: 6.6 10*3/uL (ref 1.4–7.7)
Neutrophils Relative %: 61.7 % (ref 43.0–77.0)
Platelets: 436 10*3/uL — ABNORMAL HIGH (ref 150.0–400.0)
RBC: 4.92 Mil/uL (ref 3.87–5.11)
RDW: 14.1 % (ref 11.5–15.5)
WBC: 10.8 10*3/uL — ABNORMAL HIGH (ref 4.0–10.5)

## 2021-08-12 LAB — TSH: TSH: 0.83 u[IU]/mL (ref 0.35–5.50)

## 2021-08-12 LAB — B12 AND FOLATE PANEL
Folate: >23.5 ng/mL (ref >5.9)
Vitamin B-12: 651 pg/mL (ref 211–911)

## 2021-08-12 LAB — LDL CHOLESTEROL, DIRECT: Direct LDL: 131 mg/dL

## 2021-08-12 LAB — HEMOGLOBIN A1C: Hgb A1c MFr Bld: 9 % — ABNORMAL HIGH (ref 4.6–6.5)

## 2021-08-12 MED ORDER — GABAPENTIN 300 MG PO CAPS: 600.0000 mg | ORAL_CAPSULE | Freq: Every day | ORAL | 1 refills | Status: DC

## 2021-08-12 MED ORDER — FLUOXETINE HCL 40 MG PO CAPS: 40.0000 mg | ORAL_CAPSULE | Freq: Every day | ORAL | 1 refills | Status: DC

## 2021-08-12 MED ORDER — METFORMIN HCL 500 MG PO TABS
500.0000 mg | ORAL_TABLET | Freq: Two times a day (BID) | ORAL | 1 refills | Status: DC
Start: 2021-08-12 — End: 2022-01-22

## 2021-08-12 NOTE — Assessment & Plan Note (Signed)
Work on diet and exercise.  ?talked about decreasing fast food. ?

## 2021-08-12 NOTE — Assessment & Plan Note (Signed)
Referred placed for dermatology ?Mom with h/o melanoma ?Wear sunscreen/spf ?

## 2021-08-12 NOTE — Assessment & Plan Note (Signed)
Workup for fatigue and weight gain to include b12, folate, cbc, cmp and tsh ?

## 2021-08-12 NOTE — Progress Notes (Signed)
? ?New Patient Office Visit ? ?Subjective:  ?Patient ID: Yessika Otte, female    DOB: [birth year]  Age: 31 y.o. MRN: [id number] ? ?CC:  ?Chief Complaint  ?Patient presents with  ? Establish Care  ? ? ?HPI ?Shalita Ninneman is here to establish care as a new patient. Moved here two years ago, and finally squared away with insurance.  ? ?Prior provider was: Aquilla Hacker, MD ?Pt is without acute concerns.  ? ?Has appt pending 5/17 with GYN Darron Doom ? ?Has noticed some weight gain, hard to lose weight. Was 185, two years ago, now 221 pounds. ?Does eat [employer] daily at work, otherwise doing keto with her boyfriend.  ?Wt Readings from Last 3 Encounters:  ?08/12/21 221 lb 5 oz (100.4 kg)  ?04/20/20 188 lb (85.3 kg)  ? ?Pap smear: over three years ago. Has appt with Gyn this month.  ? ?chronic concerns: ? ?Depression with anxiety: two months increase with prozac to 40 mg once daily and feels improvement. Is concerned with weight gain. Takes also hydroxyxine 25 mg prn anxiety, uses once or twice a week. ? ?  08/12/2021  ? 11:33 AM  ?GAD 7 : Generalized Anxiety Score  ?Nervous, Anxious, on Edge 2  ?Control/stop worrying 2  ?Worry too much - different things 2  ?Trouble relaxing 2  ?Restless 2  ?Easily annoyed or irritable 2  ?Afraid - awful might happen 2  ?Total GAD 7 Score 14  ?Anxiety Difficulty Somewhat difficult  ? ? ? ?  08/12/2021  ? 11:33 AM  ?PHQ9 SCORE ONLY  ?PHQ-9 Total Score 8  ? ? 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 Diabetes related complications- insulin resistance requiring larger insulin dosing. Followed by endocrinology Dr. Osvaldo Shipper, Taking metformin 1000 mg bid , tresiba 70 units qhs, novolog based on food intake averaging 30 units with meals.  ?Last a1c per endo note 8.0%, once yearly eye exam- overdue for this year. Going to make an appointment. Last urine microalbumin negative 11/2020.  ?  ?Increased dose of metformin made her feel ill to her stomach.  ?Victoza was ok and worked well  however was at highest dose and still needing treatment so started on insulin ? ?Peripheral neuropathy bil le : taking gabapentin 600 mg at night. Helps 'somedays' ? ?Past Medical History:  ?Diagnosis Date  ? Diabetes mellitus without complication ([place])   ? ? ?Past Surgical History:  ?Procedure Laterality Date  ? LAPAROSCOPY    ? looking for endometriosis  ? ? ?Family History  ?Problem Relation Age of Onset  ? Diabetes type II Mother   ? Melanoma Mother   ? Diabetes Sister   ? Diabetes Maternal Grandmother   ? Lung cancer Maternal Grandfather   ? Brain cancer Maternal Grandfather   ? ? ?Social History  ? ?Socioeconomic History  ? Marital status: Single  ?  Spouse name: Not on file  ? Number of children: 0  ? Years of education: Not on file  ? Highest education level: High school graduate  ?Occupational History  ? 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 Smoking status: Never  ? Smokeless tobacco: Never  ?Vaping Use  ? Vaping Use: Never used  ?Substance and Sexual Activity  ? Alcohol use: Not Currently  ? Drug use: Yes  ?  Types: Marijuana  ?  Comment: daily with anxiety  ? Sexual activity: Yes  ?  Partners: Male  ?  Birth control/protection: None  ?  Comment: trying to conceive  ?Other Topics Concern  ? Not on file  ?Social History Narrative  ? Not on file  ? ?Social Determinants of Health  ? ?Financial Resource Strain: Not on file  ?Food Insecurity: Not on file  ?Transportation Needs: Not on file  ?Physical Activity: Not on file  ?Stress: Not on file  ?Social Connections: Not on file  ?Intimate Partner Violence: Not on file  ? ? ?Outpatient Medications Prior to Visit  ?Medication Sig Dispense Refill  ? albuterol (VENTOLIN HFA) 108 (90 Base) MCG/ACT inhaler Inhale 1 puff into the lungs every 6 (six) hours as needed for wheezing or shortness of breath.    ? blood glucose meter kit and supplies KIT Dispense based on patient and insurance preference. Use up to four times daily as directed. (FOR ICD-9 250.00,  250.01). 1 each 0  ? Cetirizine HCl (ZYRTEC ALLERGY PO) Take by mouth.    ? Continuous Blood Gluc Receiver (DEXCOM G6 RECEIVER) DEVI ONE EACH BY DOES NOT APPLY ROUTE ONCE FOR 1 DOSE.    ? hydrOXYzine (ATARAX/VISTARIL) 25 MG tablet Take 25 mg by mouth daily as needed for anxiety or itching.    ? insulin aspart (NOVOLOG) 100 UNIT/ML FlexPen Sliding scale insulin Less than 70 initiate hypoglycemia protocol 70-120  0 units 120-150 2 units 151-200 3 units 201-250 5units 251-300 8 units 301-350 11 units 351-400 15 units Greater than 400 call MD 15 mL 11  ? insulin degludec (TRESIBA FLEXTOUCH) 200 UNIT/ML FlexTouch Pen     ? Insulin Syringe-Needle U-100 (INSULIN SYRINGE .5CC/31GX5/16") 31G X 5/16" 0.5 ML MISC Use as directed 100 each 0  ? prenatal vitamin w/FE, FA (PRENATAL 1 + 1) 27-1 MG TABS tablet Take 1 tablet by mouth daily at 12 noon.    ? FLUoxetine (PROZAC) 40 MG capsule     ? gabapentin (NEURONTIN) 300 MG capsule Take 600 mg by mouth at bedtime.    ? metFORMIN (GLUCOPHAGE) 500 MG tablet Take by mouth.    ? acetaminophen (TYLENOL) 500 MG tablet Take 500 mg by mouth every 6 (six) hours as needed for moderate pain.    ? FLUoxetine (PROZAC) 20 MG capsule Take 20 mg by mouth daily.    ? HYDROcodone-acetaminophen (NORCO/VICODIN) 5-325 MG tablet Take 1 tablet by mouth every 6 (six) hours as needed for severe pain. 15 tablet 0  ? insulin detemir (LEVEMIR) 100 UNIT/ML FlexPen Inject 15 Units into the skin daily. 15 mL 2  ? meloxicam (MOBIC) 15 MG tablet Take 15 mg by mouth daily.    ? metFORMIN (GLUCOPHAGE) 500 MG tablet Take 1 tablet (500 mg total) by mouth 2 (two) times daily with a meal. 60 tablet 11  ? metFORMIN (GLUCOPHAGE) 500 MG tablet Take by mouth.    ? naproxen (NAPROSYN) 500 MG tablet Take 1 tablet (500 mg total) by mouth 2 (two) times daily with a meal. 30 tablet 0  ? promethazine-dextromethorphan (PROMETHAZINE-DM) 6.25-15 MG/5ML syrup Take by mouth.    ? ?No facility-administered medications prior to visit.   ? ? ?Allergies  ?Allergen Reactions  ? Fruit & Vegetable Daily [Black Cohosh-Soyisoflav-C Quad] Anaphylaxis  ?  Apples, oraganges, kiwi, peaches  ? Penicillins Anaphylaxis  ?  swelling  ? Insulins Nausea Only and Rash  ?  Insulin glargine, degludec, aspart ? ?  ? Bee Venom Rash  ?  unknown  ? Sulfa Antibiotics Rash  ? ? ?ROS ?Review of Systems  ?Constitutional:  Positive for unexpected weight change (  weight gain, hard to lose weight). Negative for chills and fatigue.  ?Respiratory:  Negative for cough and shortness of breath.   ?Cardiovascular:  Negative for chest pain and leg swelling.  ?Gastrointestinal:  Negative for diarrhea and nausea.  ?Endocrine: Negative for cold intolerance, heat intolerance, polydipsia, polyphagia and polyuria.  ?Genitourinary:  Negative for difficulty urinating, dysuria and frequency.  ?Neurological:   ?     Nerve pain, burning numbness bil lower feet, chronic ?  ?Psychiatric/Behavioral:  Negative for agitation, behavioral problems, self-injury, sleep disturbance and suicidal ideas. The patient is nervous/anxious.   ?All other systems reviewed and are negative. ? ? ? ?  ?Objective:  ?  ?Physical Exam ?Vitals reviewed.  ?Constitutional:   ?   General: She is not in acute distress. ?   Appearance: Normal appearance. She is obese. She is not ill-appearing or toxic-appearing.  ?HENT:  ?   Right Ear: Tympanic membrane normal.  ?   Left Ear: Tympanic membrane normal.  ?   Mouth/Throat:  ?   Mouth: Mucous membranes are moist.  ?   Pharynx: No pharyngeal swelling.  ?   Tonsils: No tonsillar exudate.  ?Eyes:  ?   Extraocular Movements: Extraocular movements intact.  ?   Conjunctiva/sclera: Conjunctivae normal.  ?   Pupils: Pupils are equal, round, and reactive to light.  ?Neck:  ?   Thyroid: No thyroid mass.  ?Cardiovascular:  ?   Rate and Rhythm: Normal rate and regular rhythm.  ?Pulmonary:  ?   Effort: Pulmonary effort is normal.  ?   Breath sounds: Normal breath sounds.  ?Abdominal:  ?    General: Abdomen is flat. Bowel sounds are normal.  ?   Palpations: Abdomen is soft.  ?Musculoskeletal:     ?   General: Normal range of motion.  ?Lymphadenopathy:  ?   Cervical:  ?   Right cervical: No superf

## 2021-08-12 NOTE — Assessment & Plan Note (Signed)
Refilled gabapentin 300 mg, take two tablets po qhs ?

## 2021-08-12 NOTE — Patient Instructions (Addendum)
A referral was placed today for endocrinology as well as opthalmology (for diabetic retinopathy yearly assessment) ?Please let us know if you have not heard back within 2 weeks about the referral. ? ?Welcome to our clinic, I am happy to have you as my new patient. I am excited to continue on this healthcare journey with you. ? ?Stop by the lab prior to leaving today. I will notify you of your results once received.  ? ?Remember to make appointment for yearly diabetic eye exam.  ? ?Please keep in mind ?Any my chart messages you send have p to a three business day turnaround for a response.  ?Phone calls may have up to a one day business turnaround for a  response.  ? ?If you need a medication refill I recommend you request it through the pharmacy as this is easiest for [place] rather than sending a message and or phone call.  ? ?Due to recent changes in healthcare laws, you may see results of your imaging and/or laboratory studies on MyChart before I have had a chance to review them.  I understand that in some cases there may be results that are confusing or concerning to you. Please understand that not all results are received at the same time and often I may need to interpret multiple results in order to provide you with the best plan of care or course of treatment. Therefore, I ask that you please give me 2 business days to thoroughly review all your results before contacting my office for clarification. Should we see a critical lab result, you will be contacted sooner.  ? ?It was a pleasure seeing you today! Please do not hesitate to reach out with any questions and or concerns. ? ?Regards,  ? ?Evian Salguero ?FNP-C ? ? ?

## 2021-08-12 NOTE — Assessment & Plan Note (Signed)
Lipid panel ordered pending results.   

## 2021-08-12 NOTE — Assessment & Plan Note (Signed)
Continue metformin 500 mg po bid ?Continue tresiba 70 units qhs ?Continue novolog per sliding scale ?Continue with dexcom ?Ordered hga1c today pending results. Work on diabetic diet and exercise as tolerated. Yearly foot exam, and annual eye exam.  ? ?

## 2021-08-12 NOTE — Assessment & Plan Note (Signed)
Refill prozac 40 mg once daily ?Reviewed gad7  ?Reviewed phq9 ?Work on anxiety reducing techniques ?Prn hydroxyxine 25 mg ?

## 2021-08-13 NOTE — Progress Notes (Signed)
Work on low cholesterol diet ?White blood cell mildly elevated as well as platelets. Has this ever been discussed before? Does pt smoke at all? ?A1c much too high for diabetes, which was expected. Is there average amount typical on tresiba dose , 70 units nightly?

## 2021-08-19 NOTE — Progress Notes (Signed)
Reviewed last endo note.  ?Pt is supposed to be on higher dose tresiba however pt decided to take on sliding scale. She is currently on average taking 36 units. Advised pt to titrate upwards, by 2 units a night as necessary for goal <150. Goal from endo more about 66 units tresiba.

## 2021-08-27 ENCOUNTER — Encounter: Payer: No Typology Code available for payment source | Admitting: Family Medicine

## 2021-08-27 ENCOUNTER — Other Ambulatory Visit (HOSPITAL_COMMUNITY)
Admission: RE | Admit: 2021-08-27 | Discharge: 2021-08-27 | Disposition: A | Discharge status: Home / Self Care | Payer: No Typology Code available for payment source | Source: Ambulatory Visit | Attending: Family Medicine | Admitting: Family Medicine

## 2021-08-27 ENCOUNTER — Encounter: Payer: Self-pay | Admitting: Family Medicine

## 2021-08-27 ENCOUNTER — Ambulatory Visit (INDEPENDENT_AMBULATORY_CARE_PROVIDER_SITE_OTHER): Payer: No Typology Code available for payment source | Admitting: Family Medicine

## 2021-08-27 VITALS — BP 134/84 | HR 85 | Ht 67.0 in | Wt 224.0 lb

## 2021-08-27 DIAGNOSIS — Z3169 Encounter for other general counseling and advice on procreation: Secondary | ICD-10-CM | POA: Insufficient documentation

## 2021-08-27 DIAGNOSIS — Z124 Encounter for screening for malignant neoplasm of cervix: Secondary | ICD-10-CM

## 2021-08-27 DIAGNOSIS — Z01419 Encounter for gynecological examination (general) (routine) without abnormal findings: Secondary | ICD-10-CM | POA: Diagnosis not present

## 2021-08-27 DIAGNOSIS — Z794 Long term (current) use of insulin: Secondary | ICD-10-CM

## 2021-08-27 DIAGNOSIS — E119 Type 2 diabetes mellitus without complications: Secondary | ICD-10-CM | POA: Diagnosis not present

## 2021-08-27 DIAGNOSIS — J45909 Unspecified asthma, uncomplicated: Secondary | ICD-10-CM | POA: Insufficient documentation

## 2021-08-27 NOTE — Progress Notes (Signed)
Subjective:  ?  ? Deborah Pierce is a 31 y.o. female and is here for a comprehensive physical exam. The patient reports problems - Has been trying to have a baby for past 1 year . Has h/o SAB x 1. Has regular monthly cycles. Denies h/o PID or STDs. ? ? ?The following portions of the patient's history were reviewed and updated as appropriate: allergies, current medications, past family history, past medical history, past social history, past surgical history, and problem list. ? ?Review of Systems ?Pertinent items noted in HPI and remainder of comprehensive ROS otherwise negative.  ? ?Objective:  ? ? BP 134/84   Pulse 85   Ht '5\' 7"'$  (1.702 m)   Wt 224 lb (101.6 kg)   LMP 08/25/2021 (Exact Date)   BMI 35.08 kg/m?  ?General appearance: alert, cooperative, and appears stated age ?Head: Normocephalic, without obvious abnormality, atraumatic ?Neck: no adenopathy, supple, symmetrical, trachea midline, and thyroid not enlarged, symmetric, no tenderness/mass/nodules ?Lungs: clear to auscultation bilaterally ?Breasts: normal appearance, no masses or tenderness ?Heart: regular rate and rhythm, S1, S2 normal, no murmur, click, rub or gallop ?Abdomen: soft, non-tender; bowel sounds normal; no masses,  no organomegaly ?Pelvic: cervix normal in appearance, external genitalia normal, no adnexal masses or tenderness, no cervical motion tenderness, uterus normal size, shape, and consistency, and vagina normal without discharge ?Extremities: extremities normal, atraumatic, no cyanosis or edema ?Pulses: 2+ and symmetric ?Skin: Skin color, texture, turgor normal. No rashes or lesions ?Lymph nodes: Cervical, supraclavicular, and axillary nodes normal. ?Neurologic: Grossly normal  ?  ?Assessment:  ? ? Healthy female exam.    ?  ?Plan:  ? ?Problem List Items Addressed This Visit   ? ?  ? Unprioritized  ? Controlled type 2 diabetes mellitus without complication, with long-term current use of insulin ([place])  ?  99203 - reiterated  importance of glycemic control for healthy pregnancy outcomes. Class C. Last A1C is 9.0. ? ?  ?  ? Infertility counseling  ?  [phone] - discussed infertility causes. Has no H/o PID, had dx scope with no evidence of endometriosis. Has regular monthly cycles so likely ovulating.  ?Check HSG and semen analysis. ? ?  ?  ? Relevant Orders  ? DG Hysterogram (HSG)  ? ?Other Visit Diagnoses   ? ? Screening for malignant neoplasm of cervix    -  Primary  ? 99385  ? Relevant Orders  ? Cytology - PAP  ? Encounter for gynecological examination without abnormal finding      ? [id number]  ? ?  ? ?Return in 1 year (on 08/28/2022). ? ?  ?See After Visit Summary for Counseling Recommendations  ? ?

## 2021-08-27 NOTE — Assessment & Plan Note (Signed)
99203 - reiterated importance of glycemic control for healthy pregnancy outcomes. Class C. Last A1C is 9.0. ?

## 2021-08-27 NOTE — Assessment & Plan Note (Signed)
99203 - discussed infertility causes. Has no H/o PID, had dx scope with no evidence of endometriosis. Has regular monthly cycles so likely ovulating.  ?Check HSG and semen analysis. ?

## 2021-08-27 NOTE — Patient Instructions (Signed)
Preventive Care 21-31 Years Old, Female ?Preventive care refers to lifestyle choices and visits with your health care provider that can promote health and wellness. Preventive care visits are also called wellness exams. ?What can I expect for my preventive care visit? ?Counseling ?During your preventive care visit, your health care provider may ask about your: ?Medical history, including: ?Past medical problems. ?Family medical history. ?Pregnancy history. ?Current health, including: ?Menstrual cycle. ?Method of birth control. ?Emotional well-being. ?Home life and relationship well-being. ?Sexual activity and sexual health. ?Lifestyle, including: ?Alcohol, nicotine or tobacco, and drug use. ?Access to firearms. ?Diet, exercise, and sleep habits. ?Work and work environment. ?Sunscreen use. ?Safety issues such as seatbelt and bike helmet use. ?Physical exam ?Your health care provider may check your: ?Height and weight. These may be used to calculate your BMI (body mass index). BMI is a measurement that tells if you are at a healthy weight. ?Waist circumference. This measures the distance around your waistline. This measurement also tells if you are at a healthy weight and may help predict your risk of certain diseases, such as type 2 diabetes and high blood pressure. ?Heart rate and blood pressure. ?Body temperature. ?Skin for abnormal spots. ?What immunizations do I need? ? ?Vaccines are usually given at various ages, according to a schedule. Your health care provider will recommend vaccines for you based on your age, medical history, and lifestyle or other factors, such as travel or where you work. ?What tests do I need? ?Screening ?Your health care provider may recommend screening tests for certain conditions. This may include: ?Pelvic exam and Pap test. ?Lipid and cholesterol levels. ?Diabetes screening. This is done by checking your blood sugar (glucose) after you have not eaten for a while (fasting). ?Hepatitis  B test. ?Hepatitis C test. ?HIV (human immunodeficiency virus) test. ?STI (sexually transmitted infection) testing, if you are at risk. ?BRCA-related cancer screening. This may be done if you have a family history of breast, ovarian, tubal, or peritoneal cancers. ?Talk with your health care provider about your test results, treatment options, and if necessary, the need for more tests. ?Follow these instructions at home: ?Eating and drinking ? ?Eat a healthy diet that includes fresh fruits and vegetables, whole grains, lean protein, and low-fat dairy products. ?Take vitamin and mineral supplements as recommended by your health care provider. ?Do not drink alcohol if: ?Your health care provider tells you not to drink. ?You are pregnant, may be pregnant, or are planning to become pregnant. ?If you drink alcohol: ?Limit how much you have to 0-1 drink a day. ?Know how much alcohol is in your drink. In the [place] one drink equals one 12 oz bottle of beer (355 mL), one 5 oz glass of wine (148 mL), or one 1? oz glass of hard liquor (44 mL). ?Lifestyle ?Brush your teeth every morning and night with fluoride toothpaste. Floss one time each day. ?Exercise for at least 30 minutes 5 or more days each week. ?Do not use any products that contain nicotine or tobacco. These products include cigarettes, chewing tobacco, and vaping devices, such as e-cigarettes. If you need help quitting, ask your health care provider. ?Do not use drugs. ?If you are sexually active, practice safe sex. Use a condom or other form of protection to prevent STIs. ?If you do not wish to become pregnant, use a form of birth control. If you plan to become pregnant, see your health care provider for a prepregnancy visit. ?Find healthy ways to manage stress, such as: ?Meditation,   yoga, or listening to music. ?Journaling. ?Talking to a trusted person. ?Spending time with friends and family. ?Minimize exposure to UV radiation to reduce your risk of skin  cancer. ?Safety ?Always wear your seat belt while driving or riding in a vehicle. ?Do not drive: ?If you have been drinking alcohol. Do not ride with someone who has been drinking. ?If you have been using any mind-altering substances or drugs. ?While texting. ?When you are tired or distracted. ?Wear a helmet and other protective equipment during sports activities. ?If you have firearms in your house, make sure you follow all gun safety procedures. ?Seek help if you have been physically or sexually abused. ?What's next? ?Go to your health care provider once a year for an annual wellness visit. ?Ask your health care provider how often you should have your eyes and teeth checked. ?Stay up to date on all vaccines. ?This information is not intended to replace advice given to you by your health care provider. Make sure you discuss any questions you have with your health care provider. ?Document Revised: 09/25/2020 Document Reviewed: 09/25/2020 ?Elsevier Patient Education ? [place]. ? ?

## 2021-08-28 ENCOUNTER — Encounter: Payer: Self-pay | Admitting: *Deleted

## 2021-08-29 LAB — CYTOLOGY - PAP
Comment: NEGATIVE
Diagnosis: NEGATIVE
High risk HPV: NEGATIVE

## 2021-09-15 ENCOUNTER — Ambulatory Visit (INDEPENDENT_AMBULATORY_CARE_PROVIDER_SITE_OTHER): Payer: No Typology Code available for payment source

## 2021-09-15 ENCOUNTER — Ambulatory Visit (HOSPITAL_COMMUNITY)
Admission: EM | Admit: 2021-09-15 | Discharge: 2021-09-15 | Disposition: A | Discharge status: Home / Self Care | Payer: No Typology Code available for payment source | Attending: Sports Medicine | Admitting: Sports Medicine

## 2021-09-15 ENCOUNTER — Other Ambulatory Visit: Payer: Self-pay

## 2021-09-15 ENCOUNTER — Encounter (HOSPITAL_COMMUNITY): Payer: Self-pay | Admitting: Emergency Medicine

## 2021-09-15 DIAGNOSIS — M7989 Other specified soft tissue disorders: Secondary | ICD-10-CM | POA: Diagnosis not present

## 2021-09-15 DIAGNOSIS — M79645 Pain in left finger(s): Secondary | ICD-10-CM | POA: Diagnosis not present

## 2021-09-15 DIAGNOSIS — M659 Synovitis and tenosynovitis, unspecified: Secondary | ICD-10-CM

## 2021-09-15 DIAGNOSIS — S6992XA Unspecified injury of left wrist, hand and finger(s), initial encounter: Secondary | ICD-10-CM | POA: Diagnosis not present

## 2021-09-15 MED ORDER — DOXYCYCLINE HYCLATE 100 MG PO CAPS: 100.0000 mg | ORAL_CAPSULE | Freq: Two times a day (BID) | ORAL | 0 refills | Status: AC

## 2021-09-15 MED ORDER — INDOMETHACIN 50 MG PO CAPS: 50.0000 mg | ORAL_CAPSULE | Freq: Three times a day (TID) | ORAL | 0 refills | Status: AC

## 2021-09-15 NOTE — Discharge Instructions (Signed)
-   Take Doxycycline 1 tablet twice a day x 7 days - Take indomethacin 1 tablet (with food) every 8 hours as needed - Recommend watching the swelling and redness, if this does not improve over the next 2 days or worsens and extends up into the hand, you need to present here or the ED right away. -If symptoms are not improving by Thursday, would recommend calling and setting up an appointment with one of the orthopedic physicians listed above

## 2021-09-15 NOTE — ED Provider Notes (Signed)
[facility]    CSN: [id number] Arrival date & time: 09/15/21  0813      History   Chief Complaint Chief Complaint  Patient presents with   Finger Injury    HPI Deborah Pierce is a 31 y.o. female here for left third finger pain  HPI  Patient presents with swelling and pain near the MCP of the left third long finger. Denies any known injury or trauma. She states earlier in the day she was playing in the pool with kids but denies injuring it that she knows of. She has pain and swelling over the MCP of the third left finger.  She denies any cuts or break in the skin. She denies any history of similar occurrence or any swelling of joints in the past. She denies any fever or chills.  No other finger or joint pain and swelling. She is able to bend and extend the finger, although it is painful at the MCP knuckle.  Did take Aleve once without much relief.   Has Type II DM - diagnosed at age 56 Takes metformin, Novolog, Tresiba  Past Medical History:  Diagnosis Date   Diabetes mellitus without complication [facility])     Patient Active Problem List   Diagnosis Date Noted   Infertility counseling 08/27/2021   Controlled type 2 diabetes mellitus without complication, with long-term current use of insulin ([place]) 08/12/2021   Diabetic peripheral neuropathy ([place]) 08/12/2021   Class 1 obesity due to excess calories with serious comorbidity and body mass index (BMI) of 34.0 to 34.9 in adult 08/12/2021   Other fatigue 08/12/2021   Depression with anxiety 08/12/2021   Seasonal allergies 08/07/2011    Past Surgical History:  Procedure Laterality Date   LAPAROSCOPY     looking for endometriosis    OB History     Gravida  1   Para  0   Term  0   Preterm      AB  1   Living  0      SAB  1   IAB      Ectopic      Multiple      Live Births  0        Obstetric Comments  March 2022 spontaneous abortion/miscarriage          Home Medications    Prior  to Admission medications   Medication Sig Start Date End Date Taking? Authorizing Provider  doxycycline (VIBRAMYCIN) 100 MG capsule Take 1 capsule (100 mg total) by mouth 2 (two) times daily for 7 days. 09/15/21 09/22/21 Yes Elba Barman, DO  indomethacin (INDOCIN) 50 MG capsule Take 1 capsule (50 mg total) by mouth 3 (three) times daily with meals for 7 days. 09/15/21 09/22/21 Yes Elba Barman, DO  albuterol (VENTOLIN HFA) 108 (90 Base) MCG/ACT inhaler Inhale 1 puff into the lungs every 6 (six) hours as needed for wheezing or shortness of breath. 03/13/16   [provider]  blood glucose meter kit and supplies KIT Dispense based on patient and insurance preference. Use up to four times daily as directed. (FOR ICD-9 250.00, 250.01). 04/22/20   Oswald Hillock, MD  Cetirizine HCl (ZYRTEC ALLERGY PO) Take by mouth.    [provider]  Continuous Blood Gluc Receiver (DEXCOM G6 RECEIVER) [place] BY DOES NOT APPLY ROUTE ONCE FOR 1 DOSE. 09/20/20   [provider]  Continuous Blood Gluc Sensor (DEXCOM G6 SENSOR) MISC SMARTSIG:1 Each Topical Every 10 Days  08/24/21   [provider]  FLUoxetine (PROZAC) 40 MG capsule Take 1 capsule (40 mg total) by mouth daily. 08/12/21 02/08/22  Eugenia Pancoast, FNP  gabapentin (NEURONTIN) 300 MG capsule Take 2 capsules (600 mg total) by mouth at bedtime. 08/12/21 02/08/22  Eugenia Pancoast, FNP  hydrOXYzine (ATARAX/VISTARIL) 25 MG tablet Take 25 mg by mouth daily as needed for anxiety or itching.    [provider]  insulin aspart (NOVOLOG) 100 UNIT/ML FlexPen Sliding scale insulin Less than 70 initiate hypoglycemia protocol 70-120  0 units 120-150 2 units 151-200 3 units 201-250 5units 251-300 8 units 301-350 11 units 351-400 15 units Greater than 400 call MD 04/22/20   Oswald Hillock, MD  insulin degludec (TRESIBA FLEXTOUCH) 200 UNIT/ML FlexTouch Pen  09/16/20   [provider]  Insulin Syringe-Needle U-100 (INSULIN SYRINGE  .5CC/31GX5/16") 31G X 5/16" 0.5 ML MISC Use as directed 04/22/20   Oswald Hillock, MD  metFORMIN (GLUCOPHAGE) 500 MG tablet Take 1 tablet (500 mg total) by mouth 2 (two) times daily with a meal. 08/12/21 02/08/22  Eugenia Pancoast, FNP  prenatal vitamin w/FE, FA (PRENATAL 1 + 1) 27-1 MG TABS tablet Take 1 tablet by mouth daily at 12 noon.    [provider]    Family History Family History  Problem Relation Age of Onset   Diabetes Mother    Diabetes type II Mother    Melanoma Mother    Diabetes Sister    Diabetes Maternal Grandmother    Lung cancer Maternal Grandfather    Brain cancer Maternal Grandfather     Social History Social History   Tobacco Use   Smoking status: Never   Smokeless tobacco: Never  Vaping Use   Vaping Use: Never used  Substance Use Topics   Alcohol use: Not Currently   Drug use: Yes    Types: Marijuana    Comment: daily with anxiety     Allergies   Fruit & vegetable daily [black cohosh-soyisoflav-c quad], Penicillins, Insulins, Bee venom, and Sulfa antibiotics   Review of Systems Review of Systems  Constitutional:  Negative for chills and fever.  Respiratory:  Negative for shortness of breath.   Cardiovascular:  Negative for chest pain.  Musculoskeletal:  Positive for arthralgias (left MCP/finger pain) and joint swelling.  Skin:  Negative for rash and wound.    Physical Exam Triage Vital Signs ED Triage Vitals  Enc Vitals Group     BP 09/15/21 0855 130/86     Pulse Rate 09/15/21 0855 96     Resp 09/15/21 0855 18     Temp 09/15/21 0855 98.7 F (37.1 C)     Temp Source 09/15/21 0855 Oral     SpO2 09/15/21 0855 96 %     Weight --      Height --      Head Circumference --      Peak Flow --      Pain Score 09/15/21 0853 7     Pain Loc --      Pain Edu? --      Excl. in [place]? --    No data found.  Updated Vital Signs BP 130/86 (BP Location: Right Arm)   Pulse 96   Temp 98.7 F (37.1 C) (Oral)   Resp 18   LMP 08/25/2021 (Exact  Date)   SpO2 96%    Physical Exam Gen: Well-appearing, in no acute distress; non-toxic CV: Regular Rate. Well-perfused. Warm.  Resp: Breathing unlabored on room  air; no wheezing. Psych: Fluid speech in conversation; appropriate affect; normal thought process Neuro: Sensation intact throughout. No gross coordination deficits.  MSK:   - Left long finger: Inspection demonstrates some soft tissue swelling around the MCP of the finger.  There is some warmth noted to this area.  Very mild redness near the knuckle, however no spreading erythema.  There is limited range of motion at the MCP however able to actively extend and flex the finger.  Extension to about 5 degrees lag, flexion approximately 80 degrees.  There is some mild TTP palpating up the palmar aspect of the finger into the hand.  Neurovascular intact distally.  UC Treatments / Results  Labs (all labs ordered are listed, but only abnormal results are displayed) Labs Reviewed - No data to display  EKG   Radiology DG Finger Middle Left  Result Date: 09/15/2021 CLINICAL DATA:  Left middle finger injury EXAM: LEFT MIDDLE FINGER 2+V COMPARISON:  None Available. FINDINGS: Soft tissue swelling is present. There is no acute fracture. Joint spaces are preserved. No intrinsic osseous lesion. IMPRESSION: No significant osseous abnormality. Electronically Signed   By: Macy Mis M.D.   On: 09/15/2021 09:12    Procedures Procedures (including critical care time)  Medications Ordered in UC Medications - No data to display  Initial Impression / Assessment and Plan / UC Course  I have reviewed the triage vital signs and the nursing notes.  Pertinent labs & imaging results that were available during my care of the patient were reviewed by me and considered in my medical decision making (see chart for details).     Patient presents with left middle finger, more specifically the MCP joint swelling and pain since yesterday.  No known  injury.  Has some limitation in flexion and extension secondary to pain and swelling.  X-ray was negative for any acute fracture or bony abnormality.  Mild concern for possible early tenosynovitis.  We will treat with doxycycline 100 mg twice a day x7 days as well as anti-inflammatories with indomethacin.  Strict return precautions were provided if there is any spreading erythema, or worsening of pain and swelling up proximally into the hand or finger.  Patient is aware if this happens she is to present here to the emergency room right away.  We will try the antibiotic and indomethacin for the next few days, if not getting better in 2-3 days information for hand orthopedic surgeon provided for patient to call and set up appointment.  Final Clinical Impressions(s) / UC Diagnoses   Final diagnoses:  Swelling of left middle finger  Tenosynovitis  Injury of finger of left hand, initial encounter     Discharge Instructions      - Take Doxycycline 1 tablet twice a day x 7 days - Take indomethacin 1 tablet (with food) every 8 hours as needed - Recommend watching the swelling and redness, if this does not improve over the next 2 days or worsens and extends up into the hand, you need to present here or the ED right away. -If symptoms are not improving by Thursday, would recommend calling and setting up an appointment with one of the orthopedic physicians listed above     ED Prescriptions     Medication Sig Dispense Auth. Provider   indomethacin (INDOCIN) 50 MG capsule Take 1 capsule (50 mg total) by mouth 3 (three) times daily with meals for 7 days. 21 capsule Elba Barman, DO   doxycycline (VIBRAMYCIN) 100 MG capsule Take  1 capsule (100 mg total) by mouth 2 (two) times daily for 7 days. 14 capsule Elba Barman, DO      PDMP not reviewed this encounter.   Elba Barman, DO 09/15/21 1001

## 2021-09-15 NOTE — ED Triage Notes (Signed)
Left middle finger injury.  Patient was playing in the pool, but not sure what exactly happened to finger.  Left middle finger is swollen, painful, unable to bend knuckle.  Patient has taken aleve.  Also, slight bruising at base of finger

## 2021-11-25 ENCOUNTER — Ambulatory Visit (INDEPENDENT_AMBULATORY_CARE_PROVIDER_SITE_OTHER): Payer: No Typology Code available for payment source

## 2021-11-25 ENCOUNTER — Ambulatory Visit (INDEPENDENT_AMBULATORY_CARE_PROVIDER_SITE_OTHER): Payer: No Typology Code available for payment source | Admitting: *Deleted

## 2021-11-25 VITALS — BP 131/82 | HR 83 | Wt 217.0 lb

## 2021-11-25 DIAGNOSIS — O3680X0 Pregnancy with inconclusive fetal viability, not applicable or unspecified: Secondary | ICD-10-CM | POA: Diagnosis not present

## 2021-11-25 DIAGNOSIS — O0990 Supervision of high risk pregnancy, unspecified, unspecified trimester: Secondary | ICD-10-CM | POA: Insufficient documentation

## 2021-11-25 NOTE — Progress Notes (Signed)
Patient was assessed and managed by nursing staff during this encounter. I have reviewed the chart and agree with the documentation and plan. I have also made any necessary editorial changes.  RN asked to have patient starting checking AM fasting, 2 hour post prandial CBGs, get her set up with DM education and to clarify what is her current DM regimen.   Aletha Halim, MD 11/25/2021 11:00 PM

## 2021-11-25 NOTE — Progress Notes (Cosign Needed)
New OB Intake  I explained I am completing New OB Intake today. We discussed her EDD of 07/07/2022 that is based on todays scan. LMP of 09/22/21. Pt is G2/P0. I reviewed her allergies, medications, Medical/Surgical/OB history, and appropriate screenings.   Patient Active Problem List   Diagnosis Date Noted   Supervision of high risk pregnancy, antepartum 11/25/2021   Controlled type 2 diabetes mellitus without complication, with long-term current use of insulin ([place]) 08/12/2021   Diabetic peripheral neuropathy ([place]) 08/12/2021   Class 1 obesity due to excess calories with serious comorbidity and body mass index (BMI) of 34.0 to 34.9 in adult 08/12/2021   Depression with anxiety 08/12/2021    Concerns addressed today  Delivery Plans:  Plans to deliver at [facility].   MyChart/Babyscripts MyChart access verified. I explained pt will have some visits in office and some virtually. Babyscripts app discussed and ordered.   Blood Pressure Cuff  BP cuff to be given at New OB visit. Discussed to be used for virtual visits and or if needed BP checks weekly.    Anatomy US Explained first scheduled [place] will be around 19 weeks.   Labs Discussed Johnsie Cancel genetic screening with patient.  Routine prenatal labs needed.   Placed OB Box on problem list and updated   Patient informed that the ultrasound is considered a limited obstetric ultrasound and is not intended to be a complete ultrasound exam.  Patient also informed that the ultrasound is not being completed with the intent of assessing for fetal or placental anomalies or any pelvic abnormalities. Explained that the purpose of today's ultrasound is to assess for dating and fetal heart rate.  Patient acknowledges the purpose of the exam and the limitations of the study.      First visit review I reviewed new OB appt with pt. I explained she will have ob bloodwork with possible genetic screening. Explained pt will be seen by Dr Kennon Rounds at first visit.     Crosby Oyster, RN 11/25/2021  9:58 AM

## 2021-12-17 ENCOUNTER — Other Ambulatory Visit (HOSPITAL_COMMUNITY)
Admission: RE | Admit: 2021-12-17 | Discharge: 2021-12-17 | Disposition: A | Discharge status: Home / Self Care | Payer: No Typology Code available for payment source | Source: Ambulatory Visit | Attending: Family Medicine | Admitting: Family Medicine

## 2021-12-17 ENCOUNTER — Ambulatory Visit (INDEPENDENT_AMBULATORY_CARE_PROVIDER_SITE_OTHER): Payer: No Typology Code available for payment source | Admitting: Family Medicine

## 2021-12-17 VITALS — BP 124/82 | HR 96 | Wt 214.0 lb

## 2021-12-17 DIAGNOSIS — O219 Vomiting of pregnancy, unspecified: Secondary | ICD-10-CM

## 2021-12-17 DIAGNOSIS — Z794 Long term (current) use of insulin: Secondary | ICD-10-CM

## 2021-12-17 DIAGNOSIS — O24119 Pre-existing diabetes mellitus, type 2, in pregnancy, unspecified trimester: Secondary | ICD-10-CM | POA: Diagnosis not present

## 2021-12-17 DIAGNOSIS — O0990 Supervision of high risk pregnancy, unspecified, unspecified trimester: Secondary | ICD-10-CM | POA: Insufficient documentation

## 2021-12-17 DIAGNOSIS — E119 Type 2 diabetes mellitus without complications: Secondary | ICD-10-CM | POA: Diagnosis not present

## 2021-12-17 DIAGNOSIS — E559 Vitamin D deficiency, unspecified: Secondary | ICD-10-CM | POA: Insufficient documentation

## 2021-12-17 DIAGNOSIS — Z23 Encounter for immunization: Secondary | ICD-10-CM | POA: Diagnosis not present

## 2021-12-17 DIAGNOSIS — E1142 Type 2 diabetes mellitus with diabetic polyneuropathy: Secondary | ICD-10-CM

## 2021-12-17 DIAGNOSIS — F418 Other specified anxiety disorders: Secondary | ICD-10-CM

## 2021-12-17 DIAGNOSIS — E782 Mixed hyperlipidemia: Secondary | ICD-10-CM | POA: Insufficient documentation

## 2021-12-17 LAB — OB RESULTS CONSOLE RPR: RPR: NONREACTIVE

## 2021-12-17 LAB — OB RESULTS CONSOLE HIV ANTIBODY (ROUTINE TESTING): HIV: NONREACTIVE

## 2021-12-17 LAB — HEPATITIS C ANTIBODY: HCV Ab: NEGATIVE

## 2021-12-17 LAB — OB RESULTS CONSOLE RUBELLA ANTIBODY, IGM: Rubella: IMMUNE

## 2021-12-17 LAB — OB RESULTS CONSOLE HEPATITIS B SURFACE ANTIGEN: Hepatitis B Surface Ag: NEGATIVE

## 2021-12-17 MED ORDER — ONDANSETRON 4 MG PO TBDP: 4.0000 mg | ORAL_TABLET | Freq: Three times a day (TID) | ORAL | 2 refills | Status: DC | PRN

## 2021-12-17 MED ORDER — ASPIRIN 81 MG PO CHEW: 81.0000 mg | CHEWABLE_TABLET | Freq: Every day | ORAL | 2 refills | Status: DC

## 2021-12-17 NOTE — Progress Notes (Signed)
Subjective:   Deborah Pierce is a 31 y.o. G2P0010 at [redacted]w[redacted]d by early ultrasound being seen today for her first obstetrical visit.  Her obstetrical history is significant for obesity and T2DM, last A1c is 7. Pregnancy history fully reviewed. Had CPE 08/2021.  Patient reports nausea and vomiting.  HISTORY: OB History  Gravida Para Term Preterm AB Living  2 0 0 0 1 0  SAB IAB Ectopic Multiple Live Births  1 0 0 0 0    # Outcome Date GA Lbr Len/2nd Weight Sex Delivery Anes PTL Lv  2 Current           1 SAB 06/2020            Obstetric Comments  March 2022 spontaneous abortion/miscarriage   Last pap smear was  08/27/2021 and was normal Past Medical History:  Diagnosis Date   Diabetes mellitus without complication ([place])    Other fatigue 08/12/2021   Seasonal allergies 08/07/2011   Past Surgical History:  Procedure Laterality Date   LAPAROSCOPY     looking for endometriosis   Family History  Problem Relation Age of Onset   Diabetes Mother    Diabetes type II Mother    Melanoma Mother    Diabetes Sister    Diabetes Maternal Grandmother    Lung cancer Maternal Grandfather    Brain cancer Maternal Grandfather    Social History   Tobacco Use   Smoking status: Never   Smokeless tobacco: Never  Vaping Use   Vaping Use: Never used  Substance Use Topics   Alcohol use: Not Currently   Drug use: Not Currently    Comment: daily with anxiety   Allergies  Allergen Reactions   Fruit & Vegetable Daily [Black Cohosh-Soyisoflav-C Quad] Anaphylaxis    Apples, oraganges, kiwi, peaches   Penicillins Anaphylaxis    swelling   Insulins Nausea Only and Rash    Insulin glargine, degludec, aspart     Tree Extract Itching   Bee Venom Rash    unknown   Insulin Degludec-Liraglutide Rash   Insulin Glargine-Lixisenatide Rash   Sulfa Antibiotics Rash   Current Outpatient Medications on File Prior to Visit  Medication Sig Dispense Refill   blood glucose meter kit and supplies KIT  Dispense based on patient and insurance preference. Use up to four times daily as directed. (FOR ICD-9 250.00, 250.01). 1 each 0   Cetirizine HCl (ZYRTEC ALLERGY PO) Take by mouth.     Continuous Blood Gluc Receiver (DEXCOM G6 RECEIVER) DEVI ONE EACH BY DOES NOT APPLY ROUTE ONCE FOR 1 DOSE.     Continuous Blood Gluc Sensor (DEXCOM G6 SENSOR) MISC SMARTSIG:1 Each Topical Every 10 Days     FLUoxetine (PROZAC) 40 MG capsule Take 1 capsule (40 mg total) by mouth daily. 90 capsule 1   hydrOXYzine (ATARAX/VISTARIL) 25 MG tablet Take 25 mg by mouth daily as needed for anxiety or itching.     insulin aspart (NOVOLOG) 100 UNIT/ML FlexPen Sliding scale insulin Less than 70 initiate hypoglycemia protocol 70-120  0 units 120-150 2 units 151-200 3 units 201-250 5units 251-300 8 units 301-350 11 units 351-400 15 units Greater than 400 call MD 15 mL 11   insulin degludec (TRESIBA FLEXTOUCH) 200 UNIT/ML FlexTouch Pen Inject 40 Units into the skin at bedtime.     Insulin Syringe-Needle U-100 (INSULIN SYRINGE .5CC/31GX5/16") 31G X 5/16" 0.5 ML MISC Use as directed 100 each 0   metFORMIN (GLUCOPHAGE) 500 MG tablet Take  1 tablet (500 mg total) by mouth 2 (two) times daily with a meal. 180 tablet 1   prenatal vitamin w/FE, FA (PRENATAL 1 + 1) 27-1 MG TABS tablet Take 1 tablet by mouth daily at 12 noon.     No current facility-administered medications on file prior to visit.     Exam   Vitals:   12/17/21 1505  BP: 124/82  Pulse: 96  Weight: 214 lb (97.1 kg)   Fetal Heart Rate (bpm): 162  System: General: well-developed, well-nourished female in no acute distress   Skin: normal coloration and turgor, no rashes   Neurologic: oriented, normal, negative, normal mood   Extremities: normal strength, tone, and muscle mass, ROM of all joints is normal   HEENT Extraocular movement intact and sclera clear, anicteric   Mouth/Teeth mucous membranes moist, pharynx normal without lesions and dental hygiene good   Neck  supple and no masses   Cardiovascular: regular rate and rhythm   Respiratory:  no respiratory distress, normal breath sounds   Abdomen: soft, non-tender; bowel sounds normal; no masses,  no organomegaly     Assessment:   Pregnancy: G2P0010 Patient Active Problem List   Diagnosis Date Noted   Mixed hyperlipidemia 12/17/2021   Vitamin D deficiency 12/17/2021   Type 2 diabetes mellitus affecting pregnancy, antepartum 12/17/2021   Supervision of high risk pregnancy, antepartum 11/25/2021   Controlled type 2 diabetes mellitus without complication, with long-term current use of insulin ([place]) 08/12/2021   Diabetic peripheral neuropathy ([place]) 08/12/2021   Class 1 obesity due to excess calories with serious comorbidity and body mass index (BMI) of 34.0 to 34.9 in adult 08/12/2021   Depression with anxiety 08/12/2021     Plan:  1. Supervision of high risk pregnancy, antepartum New OB labs with genetic screening today - Panorama Prenatal Test Full Panel - HORIZON CUSTOM - CBC/D/Plt+RPR+Rh+ABO+RubIgG... - Culture, OB Urine - Urine cytology ancillary only - Enroll Patient in PreNatal Babyscripts  2. Controlled type 2 diabetes mellitus without complication, with long-term current use of insulin (HCC) Consider increase in [place] to 25 u bid and add short acting 5u w/ every meal. Interested in Omnipod--referral made  3. Diabetic peripheral neuropathy (HCC) No Gabapentin during pregnancy  4. Type 2 diabetes mellitus affecting pregnancy, antepartum Baseline labs Continue Metformin and insulin reports fastings are less than 130 and postmeals with SSI coverage Needs to see Optho Will need Fetal ECHO later. - TSH - Hemoglobin A1c - Comprehensive metabolic panel - Protein / creatinine ratio, urine - aspirin 81 MG chewable tablet; Chew 1 tablet (81 mg total) by mouth daily.  Dispense: 90 tablet; Refill: 2  5. Need for influenza vaccination - Flu Vaccine QUAD 59mo+IM (Fluarix, Fluzone &  Alfiuria Quad PF)  6. Vitamin D deficiency Continue Vitamin D  7. Depression with anxiety On Paxil  8. Nausea and vomiting during pregnancy prior to [redacted] weeks gestation Multi-modal therapy--keep something on stomach, ginger, sea bands and zofran prn since she cannot be sleepy. - ondansetron (ZOFRAN-ODT) 4 MG disintegrating tablet; Take 1 tablet (4 mg total) by mouth every 8 (eight) hours as needed for nausea.  Dispense: 42 tablet; Refill: 2   Initial labs drawn. Continue prenatal vitamins. Genetic Screening discussed, NIPS: ordered. Ultrasound discussed; fetal anatomic survey: ordered. Problem list reviewed and updated. The nature of [place] with multiple MDs and other Advanced Practice Providers was explained to patient; also emphasized that residents, students are part of our team. Routine  obstetric precautions reviewed. Return for needs insulin pump--plesae refer to Diabetes and education for this ASAP.

## 2021-12-17 NOTE — Progress Notes (Signed)
NOB   NOB Intake completed on 11/25/21.  Last pap: 08/27/21 WNL  Flu Vaccine Offered: will discuss w/ provider.   Genetic Screening: Desires and wants to know Gender.   CC: Nausea

## 2021-12-18 LAB — COMPREHENSIVE METABOLIC PANEL
ALT: 14 IU/L (ref 0–32)
AST: 9 IU/L (ref 0–40)
Albumin/Globulin Ratio: 1.8 (ref 1.2–2.2)
Albumin: 4.5 g/dL (ref 4.0–5.0)
Alkaline Phosphatase: 76 IU/L (ref 44–121)
BUN/Creatinine Ratio: 14 (ref 9–23)
BUN: 7 mg/dL (ref 6–20)
Bilirubin Total: 0.3 mg/dL (ref 0.0–1.2)
CO2: 20 mmol/L (ref 20–29)
Calcium: 9.7 mg/dL (ref 8.7–10.2)
Chloride: 98 mmol/L (ref 96–106)
Creatinine, Ser: 0.49 mg/dL — ABNORMAL LOW (ref 0.57–1.00)
Globulin, Total: 2.5 g/dL (ref 1.5–4.5)
Glucose: 95 mg/dL (ref 70–99)
Potassium: 4.1 mmol/L (ref 3.5–5.2)
Sodium: 134 mmol/L (ref 134–144)
Total Protein: 7 g/dL (ref 6.0–8.5)
eGFR: 130 mL/min/{1.73_m2} (ref >59)

## 2021-12-18 LAB — CBC/D/PLT+RPR+RH+ABO+RUBIGG...
Antibody Screen: NEGATIVE
Basophils Absolute: 0.1 10*3/uL (ref 0.0–0.2)
Basos: 0 %
EOS (ABSOLUTE): 0.3 10*3/uL (ref 0.0–0.4)
Eos: 2 %
HCV Ab: NONREACTIVE
HIV Screen 4th Generation wRfx: NONREACTIVE
Hematocrit: 42.8 % (ref 34.0–46.6)
Hemoglobin: 14.6 g/dL (ref 11.1–15.9)
Hepatitis B Surface Ag: NEGATIVE
Immature Grans (Abs): 0 10*3/uL (ref 0.0–0.1)
Immature Granulocytes: 0 %
Lymphocytes Absolute: 2.5 10*3/uL (ref 0.7–3.1)
Lymphs: 15 %
MCH: 29.4 pg (ref 26.6–33.0)
MCHC: 34.1 g/dL (ref 31.5–35.7)
MCV: 86 fL (ref 79–97)
Monocytes Absolute: 0.9 10*3/uL (ref 0.1–0.9)
Monocytes: 6 %
Neutrophils Absolute: 12.5 10*3/uL — ABNORMAL HIGH (ref 1.4–7.0)
Neutrophils: 77 %
Platelets: 491 10*3/uL — ABNORMAL HIGH (ref 150–450)
RBC: 4.96 x10E6/uL (ref 3.77–5.28)
RDW: 13.6 % (ref 11.7–15.4)
RPR Ser Ql: NONREACTIVE
Rh Factor: POSITIVE
Rubella Antibodies, IGG: 1.23 index (ref >0.99)
WBC: 16.2 10*3/uL — ABNORMAL HIGH (ref 3.4–10.8)

## 2021-12-18 LAB — TSH: TSH: 1.73 u[IU]/mL (ref 0.450–4.500)

## 2021-12-18 LAB — PROTEIN / CREATININE RATIO, URINE
Creatinine, Urine: 79.3 mg/dL
Protein, Ur: 6.9 mg/dL
Protein/Creat Ratio: 87 mg/g creat (ref 0–200)

## 2021-12-18 LAB — HEMOGLOBIN A1C
Est. average glucose Bld gHb Est-mCnc: 154 mg/dL
Hgb A1c MFr Bld: 7 % — ABNORMAL HIGH (ref 4.8–5.6)

## 2021-12-18 LAB — HCV INTERPRETATION

## 2021-12-19 LAB — URINE CULTURE, OB REFLEX

## 2021-12-19 LAB — CULTURE, OB URINE

## 2021-12-22 LAB — URINE CYTOLOGY ANCILLARY ONLY
Chlamydia: NEGATIVE
Comment: NEGATIVE
Comment: NORMAL
Neisseria Gonorrhea: NEGATIVE

## 2021-12-25 LAB — PANORAMA PRENATAL TEST FULL PANEL:PANORAMA TEST PLUS 5 ADDITIONAL MICRODELETIONS: FETAL FRACTION: 5.5

## 2021-12-25 LAB — HORIZON CUSTOM: REPORT SUMMARY: NEGATIVE

## 2021-12-29 ENCOUNTER — Inpatient Hospital Stay (HOSPITAL_COMMUNITY)
Admission: AD | Admit: 2021-12-29 | Discharge: 2021-12-29 | Disposition: A | Discharge status: Home / Self Care | Payer: No Typology Code available for payment source | Attending: Family Medicine | Admitting: Family Medicine

## 2021-12-29 ENCOUNTER — Encounter (HOSPITAL_COMMUNITY): Payer: Self-pay | Admitting: Obstetrics & Gynecology

## 2021-12-29 ENCOUNTER — Inpatient Hospital Stay (HOSPITAL_COMMUNITY): Payer: No Typology Code available for payment source

## 2021-12-29 DIAGNOSIS — O0990 Supervision of high risk pregnancy, unspecified, unspecified trimester: Secondary | ICD-10-CM

## 2021-12-29 DIAGNOSIS — O0991 Supervision of high risk pregnancy, unspecified, first trimester: Secondary | ICD-10-CM | POA: Insufficient documentation

## 2021-12-29 DIAGNOSIS — Z3A12 12 weeks gestation of pregnancy: Secondary | ICD-10-CM

## 2021-12-29 DIAGNOSIS — U071 COVID-19: Secondary | ICD-10-CM

## 2021-12-29 DIAGNOSIS — O24419 Gestational diabetes mellitus in pregnancy, unspecified control: Secondary | ICD-10-CM | POA: Diagnosis not present

## 2021-12-29 DIAGNOSIS — O24119 Pre-existing diabetes mellitus, type 2, in pregnancy, unspecified trimester: Secondary | ICD-10-CM

## 2021-12-29 LAB — COMPREHENSIVE METABOLIC PANEL
ALT: 18 U/L (ref 0–44)
AST: 15 U/L (ref 15–41)
Albumin: 3.4 g/dL — ABNORMAL LOW (ref 3.5–5.0)
Alkaline Phosphatase: 53 U/L (ref 38–126)
Anion gap: 13 (ref 5–15)
BUN: 8 mg/dL (ref 6–20)
CO2: 23 mmol/L (ref 22–32)
Calcium: 9.4 mg/dL (ref 8.9–10.3)
Chloride: 102 mmol/L (ref 98–111)
Creatinine, Ser: 0.54 mg/dL (ref 0.44–1.00)
GFR, Estimated: >60 mL/min (ref >60)
Glucose, Bld: 115 mg/dL — ABNORMAL HIGH (ref 70–99)
Potassium: 3.9 mmol/L (ref 3.5–5.1)
Sodium: 138 mmol/L (ref 135–145)
Total Bilirubin: 0.6 mg/dL (ref 0.3–1.2)
Total Protein: 6.6 g/dL (ref 6.5–8.1)

## 2021-12-29 LAB — URINALYSIS, MICROSCOPIC (REFLEX): RBC / HPF: NONE SEEN RBC/hpf (ref 0–5)

## 2021-12-29 LAB — URINALYSIS, ROUTINE W REFLEX MICROSCOPIC
Bilirubin Urine: NEGATIVE
Glucose, UA: NEGATIVE mg/dL
Hgb urine dipstick: NEGATIVE
Ketones, ur: NEGATIVE mg/dL
Nitrite: NEGATIVE
Protein, ur: NEGATIVE mg/dL
Specific Gravity, Urine: 1.01 (ref 1.005–1.030)
pH: 7 (ref 5.0–8.0)

## 2021-12-29 LAB — RESP PANEL BY RT-PCR (FLU A&B, COVID) ARPGX2
Influenza A by PCR: NEGATIVE
Influenza B by PCR: NEGATIVE
SARS Coronavirus 2 by RT PCR: POSITIVE — AB

## 2021-12-29 LAB — CBC
HCT: 37.8 % (ref 36.0–46.0)
Hemoglobin: 13.2 g/dL (ref 12.0–15.0)
MCH: 29.1 pg (ref 26.0–34.0)
MCHC: 34.9 g/dL (ref 30.0–36.0)
MCV: 83.3 fL (ref 80.0–100.0)
Platelets: 440 10*3/uL — ABNORMAL HIGH (ref 150–400)
RBC: 4.54 MIL/uL (ref 3.87–5.11)
RDW: 13.6 % (ref 11.5–15.5)
WBC: 10.8 10*3/uL — ABNORMAL HIGH (ref 4.0–10.5)
nRBC: 0 % (ref 0.0–0.2)

## 2021-12-29 MED ORDER — ALBUTEROL SULFATE HFA 108 (90 BASE) MCG/ACT IN AERS: 2.0000 | INHALATION_SPRAY | RESPIRATORY_TRACT | 0 refills | Status: DC

## 2021-12-29 MED ORDER — NIRMATRELVIR/RITONAVIR (PAXLOVID)TABLET: 3.0000 | ORAL_TABLET | Freq: Two times a day (BID) | ORAL | 0 refills | Status: AC

## 2021-12-29 MED ORDER — ALBUTEROL SULFATE HFA 108 (90 BASE) MCG/ACT IN AERS
2.0000 | INHALATION_SPRAY | RESPIRATORY_TRACT | Status: DC
Start: 2021-12-29 — End: 2021-12-29
  Administered 2021-12-29: 2 via RESPIRATORY_TRACT
  Filled 2021-12-29: qty 6.7

## 2021-12-29 MED ORDER — NIRMATRELVIR/RITONAVIR (PAXLOVID)TABLET
3.0000 | ORAL_TABLET | Freq: Two times a day (BID) | ORAL | Status: DC
  Administered 2021-12-29: 3 via ORAL
  Filled 2021-12-29: qty 30

## 2021-12-29 NOTE — MAU Note (Signed)
.  Deborah Pierce is a 31 y.o. at 87w6dhere in MAU reporting: chest tightness and congestion and headache since Friday. Took home covid test and it was positive yesterday. Taking tylenol and dylsum for symptoms. Had fever of 100.3 on Friday . None since but has been taking tylenol.  LMP:  Onset of complaint: Friday Pain score: 3 Vitals:   12/29/21 1001  BP: 133/70  Pulse: 86  Resp: 18  Temp: 98.2 F (36.8 C)     FHT:151 Lab orders placed from triage:  covid, u/a

## 2021-12-29 NOTE — MAU Provider Note (Signed)
History     CSN: [id number]  Arrival date and time: 12/29/21 2841    Chief Complaint  Patient presents with   Headache   HPI This is a 31 year old G2, P0 at 12 weeks and 6 days who presents with chest tightness, congestion, fever for the past 3 to 4 days.  Her symptoms have been increasing mildly.  She denies shortness of breath, nausea, vomiting.  She took a COVID test yesterday which was positive.  Additionally, her boyfriend has been mildly sick but not to the same degree.  No other sick contacts.  She has not been vaccinated against COVID  OB History     Gravida  2   Para  0   Term  0   Preterm      AB  1   Living  0      SAB  1   IAB      Ectopic      Multiple      Live Births  0        Obstetric Comments  March 2022 spontaneous abortion/miscarriage         Past Medical History:  Diagnosis Date   Diabetes mellitus without complication ([place])    Other fatigue 08/12/2021   Seasonal allergies 08/07/2011    Past Surgical History:  Procedure Laterality Date   LAPAROSCOPY     looking for endometriosis    Family History  Problem Relation Age of Onset   Diabetes Mother    Diabetes type II Mother    Melanoma Mother    Diabetes Sister    Diabetes Maternal Grandmother    Lung cancer Maternal Grandfather    Brain cancer Maternal Grandfather     Social History   Tobacco Use   Smoking status: Never   Smokeless tobacco: Never  Vaping Use   Vaping Use: Never used  Substance Use Topics   Alcohol use: Not Currently   Drug use: Not Currently    Comment: daily with anxiety    Allergies:  Allergies  Allergen Reactions   Fruit & Vegetable Daily [Black Cohosh-Soyisoflav-C Quad] Anaphylaxis    Apples, oraganges, kiwi, peaches   Penicillins Anaphylaxis    swelling   Insulins Nausea Only and Rash    Insulin glargine, degludec, aspart     Tree Extract Itching   Bee Venom Rash    unknown   Insulin Degludec-Liraglutide Rash   Insulin  Glargine-Lixisenatide Rash   Sulfa Antibiotics Rash    Medications Prior to Admission  Medication Sig Dispense Refill Last Dose   aspirin 81 MG chewable tablet Chew 1 tablet (81 mg total) by mouth daily. 90 tablet 2 Past Week   Cetirizine HCl (ZYRTEC ALLERGY PO) Take by mouth.   12/29/2021   cholecalciferol (VITAMIN D3) 25 MCG (1000 UNIT) tablet Take 5,000 Units by mouth daily.   12/28/2021   FLUoxetine (PROZAC) 40 MG capsule Take 1 capsule (40 mg total) by mouth daily. 90 capsule 1 12/29/2021   insulin aspart (NOVOLOG) 100 UNIT/ML FlexPen Sliding scale insulin Less than 70 initiate hypoglycemia protocol 70-120  0 units 120-150 2 units 151-200 3 units 201-250 5units 251-300 8 units 301-350 11 units 351-400 15 units Greater than 400 call MD 15 mL 11 12/29/2021   metFORMIN (GLUCOPHAGE) 500 MG tablet Take 1 tablet (500 mg total) by mouth 2 (two) times daily with a meal. 180 tablet 1 12/29/2021   prenatal vitamin w/FE, FA (PRENATAL 1 + 1) 27-1 MG TABS tablet Take  1 tablet by mouth daily at 12 noon.   12/29/2021   blood glucose meter kit and supplies KIT Dispense based on patient and insurance preference. Use up to four times daily as directed. (FOR ICD-9 250.00, 250.01). 1 each 0    Continuous Blood Gluc Receiver (DEXCOM G6 RECEIVER) DEVI ONE EACH BY DOES NOT APPLY ROUTE ONCE FOR 1 DOSE.      Continuous Blood Gluc Sensor (DEXCOM G6 SENSOR) MISC SMARTSIG:1 Each Topical Every 10 Days      hydrOXYzine (ATARAX/VISTARIL) 25 MG tablet Take 25 mg by mouth daily as needed for anxiety or itching.      insulin degludec (TRESIBA FLEXTOUCH) 200 UNIT/ML FlexTouch Pen Inject 40 Units into the skin at bedtime.      Insulin Syringe-Needle U-100 (INSULIN SYRINGE .5CC/31GX5/16") 31G X 5/16" 0.5 ML MISC Use as directed 100 each 0    ondansetron (ZOFRAN-ODT) 4 MG disintegrating tablet Take 1 tablet (4 mg total) by mouth every 8 (eight) hours as needed for nausea. 42 tablet 2 More than a month    Review of Systems Physical  Exam   Blood pressure 133/70, pulse 86, temperature 98.2 F (36.8 C), resp. rate 18, height _0  (1.702 m), weight 98 kg, last menstrual period 09/22/2021, SpO2 97 %.  Physical Exam Vitals reviewed.  Constitutional:      Appearance: She is well-developed.  HENT:     Head: Normocephalic and atraumatic.  Cardiovascular:     Rate and Rhythm: Normal rate and regular rhythm.  Pulmonary:     Effort: Pulmonary effort is normal.     Breath sounds: Normal breath sounds.  Abdominal:     General: Bowel sounds are normal.     Palpations: Abdomen is soft.  Skin:    General: Skin is warm and dry.  Neurological:     Mental Status: She is alert.  Psychiatric:        Mood and Affect: Mood normal.        Speech: Speech normal.        Behavior: Behavior normal.    Results for orders placed or performed during the hospital encounter of 12/29/21 (from the past 24 hour(s))  Resp Panel by RT-PCR (Flu A&B, Covid) Anterior Nasal Swab     Status: Abnormal   Collection Time: 12/29/21 10:09 AM   Specimen: Anterior Nasal Swab  Result Value Ref Range   SARS Coronavirus 2 by RT PCR POSITIVE (A) NEGATIVE   Influenza A by PCR NEGATIVE NEGATIVE   Influenza B by PCR NEGATIVE NEGATIVE  Comprehensive metabolic panel     Status: Abnormal   Collection Time: 12/29/21 10:54 AM  Result Value Ref Range   Sodium 138 135 - 145 mmol/L   Potassium 3.9 3.5 - 5.1 mmol/L   Chloride 102 98 - 111 mmol/L   CO2 23 22 - 32 mmol/L   Glucose, Bld 115 (H) 70 - 99 mg/dL   BUN 8 6 - 20 mg/dL   Creatinine, Ser 0.54 0.44 - 1.00 mg/dL   Calcium 9.4 8.9 - 10.3 mg/dL   Total Protein 6.6 6.5 - 8.1 g/dL   Albumin 3.4 (L) 3.5 - 5.0 g/dL   AST 15 15 - 41 U/L   ALT 18 0 - 44 U/L   Alkaline Phosphatase 53 38 - 126 U/L   Total Bilirubin 0.6 0.3 - 1.2 mg/dL   GFR, Estimated >60 >60 mL/min   Anion gap 13 5 - 15  CBC     Status:  Abnormal   Collection Time: 12/29/21 10:54 AM  Result Value Ref Range   WBC 10.8 (H) 4.0 - 10.5 K/uL    RBC 4.54 3.87 - 5.11 MIL/uL   Hemoglobin 13.2 12.0 - 15.0 g/dL   HCT 37.8 36.0 - 46.0 %   MCV 83.3 80.0 - 100.0 fL   MCH 29.1 26.0 - 34.0 pg   MCHC 34.9 30.0 - 36.0 g/dL   RDW 13.6 11.5 - 15.5 %   Platelets 440 (H) 150 - 400 K/uL   nRBC 0.0 0.0 - 0.2 %    MAU Course  Procedures  MDM  Assessment and Plan   1. Supervision of high risk pregnancy, antepartum   2. Type 2 diabetes mellitus affecting pregnancy, antepartum   3. [redacted] weeks gestation of pregnancy   4. COVID-19     Chest tightness improved with albuterol D/c to home  Paxlovid Albuterol Continue with mucinex, tylenol, fluids. Return with worsening symptoms.  Truett Mainland 12/29/2021, 10:49 AM

## 2022-01-04 IMAGING — DX DG ELBOW COMPLETE 3+V*R*
4 series · 4 of 4 positions shown · non-contrast
Comparison: None.

CLINICAL DATA: Fall, right elbow pain

EXAM:
RIGHT ELBOW - COMPLETE 3+ VIEW

[elbow ap]
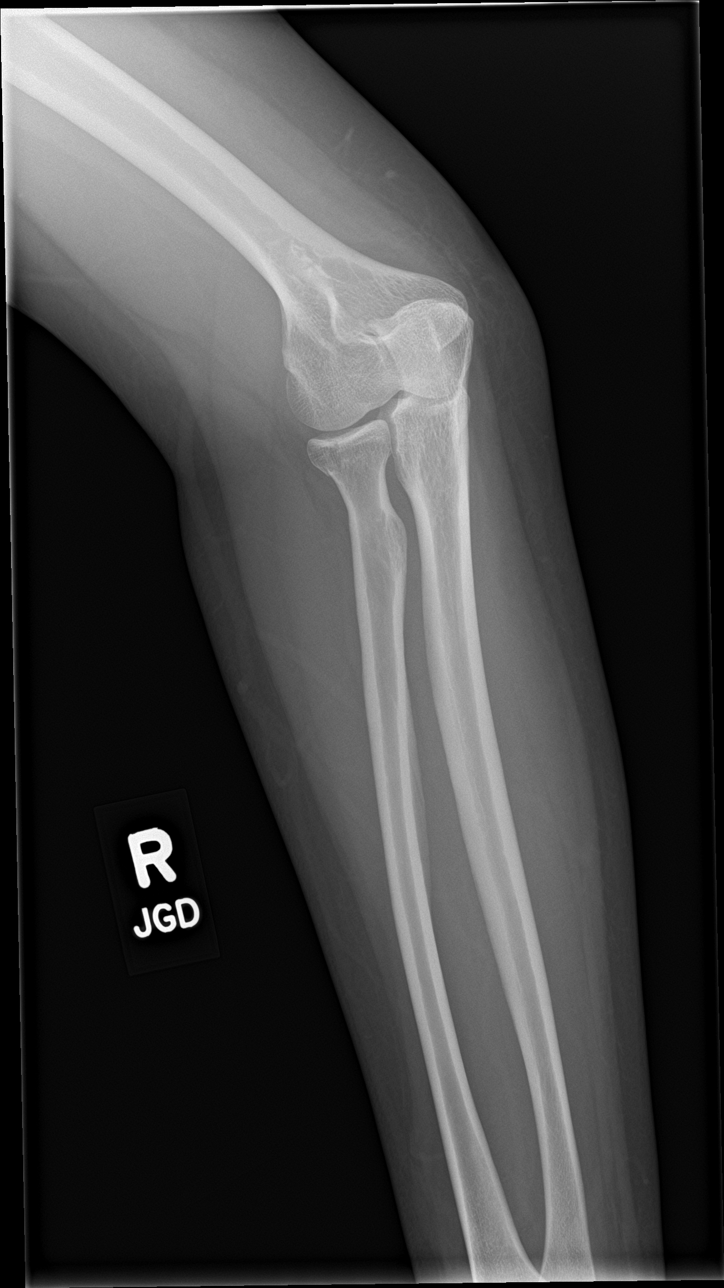

[elbow obl (1 of 2)]
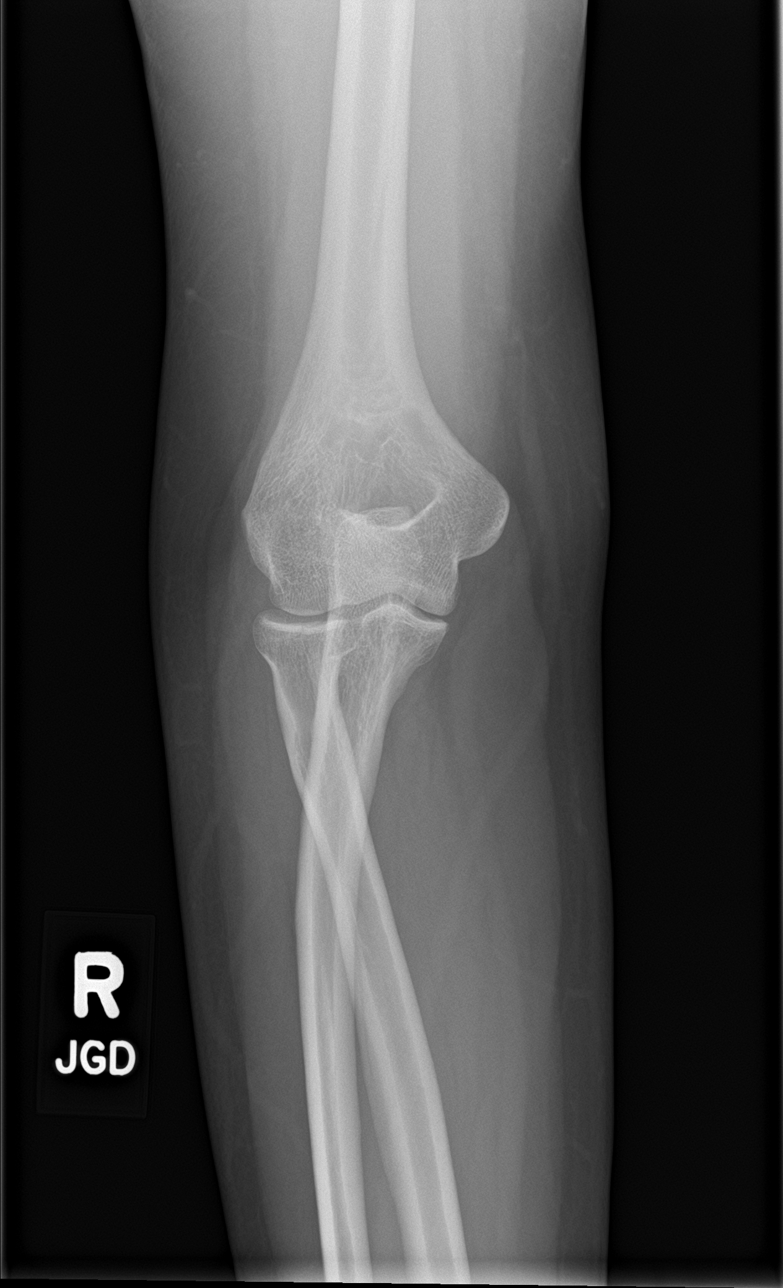

[elbow obl (2 of 2)]
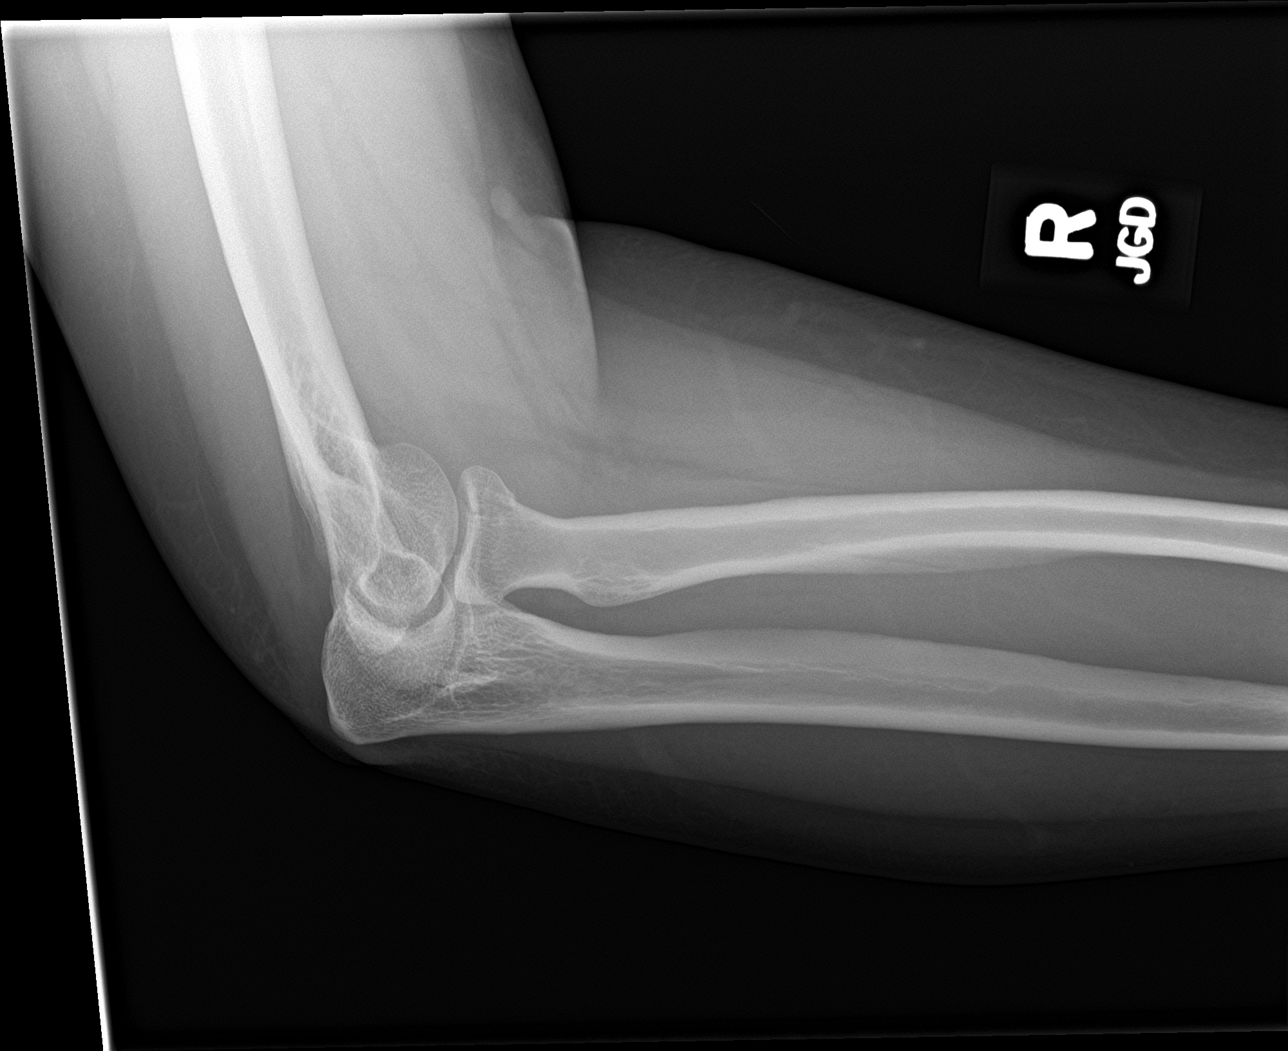

[elbow lat]
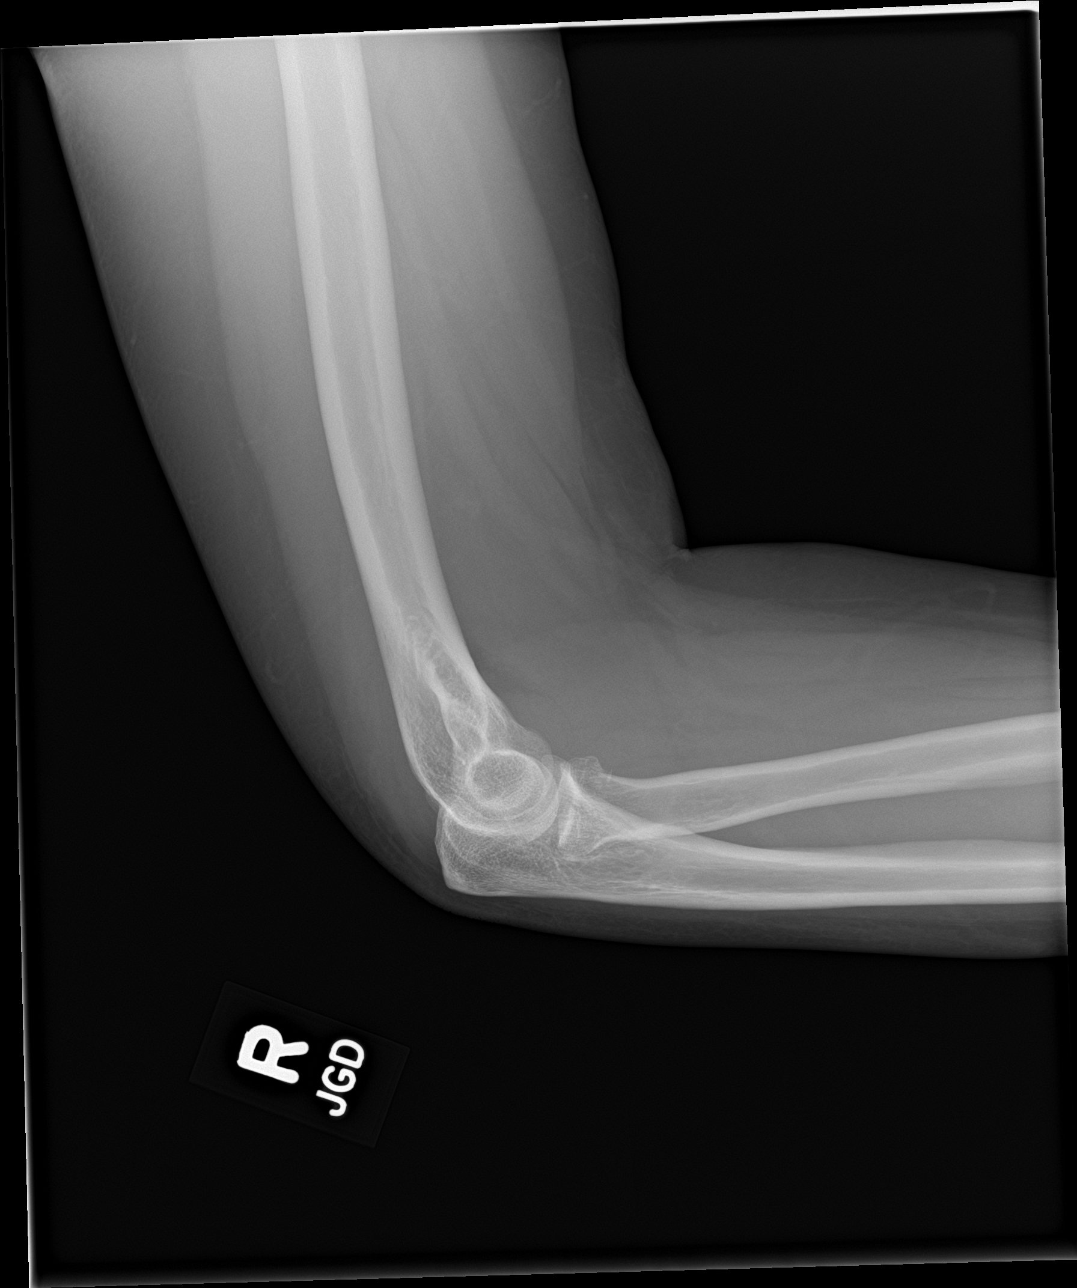

[4 of 4 positions shown; findings below may reference images not displayed]

FINDINGS: There is a fracture through the right radial head. Associated joint
effusion. No subluxation or dislocation.
IMPRESSION: Right radial head fracture.

## 2022-01-07 ENCOUNTER — Other Ambulatory Visit: Payer: Self-pay | Admitting: *Deleted

## 2022-01-07 ENCOUNTER — Encounter: Payer: Self-pay | Admitting: Family Medicine

## 2022-01-07 DIAGNOSIS — O0990 Supervision of high risk pregnancy, unspecified, unspecified trimester: Secondary | ICD-10-CM

## 2022-01-22 ENCOUNTER — Encounter: Payer: Self-pay | Admitting: Family Medicine

## 2022-01-22 ENCOUNTER — Encounter: Payer: Self-pay | Admitting: Obstetrics & Gynecology

## 2022-01-22 ENCOUNTER — Ambulatory Visit (INDEPENDENT_AMBULATORY_CARE_PROVIDER_SITE_OTHER): Payer: No Typology Code available for payment source | Admitting: Obstetrics & Gynecology

## 2022-01-22 VITALS — BP 126/82 | HR 83 | Wt 219.0 lb

## 2022-01-22 DIAGNOSIS — O24112 Pre-existing diabetes mellitus, type 2, in pregnancy, second trimester: Secondary | ICD-10-CM

## 2022-01-22 DIAGNOSIS — E119 Type 2 diabetes mellitus without complications: Secondary | ICD-10-CM

## 2022-01-22 DIAGNOSIS — O0990 Supervision of high risk pregnancy, unspecified, unspecified trimester: Secondary | ICD-10-CM

## 2022-01-22 DIAGNOSIS — Z3A16 16 weeks gestation of pregnancy: Secondary | ICD-10-CM

## 2022-01-22 DIAGNOSIS — O0992 Supervision of high risk pregnancy, unspecified, second trimester: Secondary | ICD-10-CM

## 2022-01-22 DIAGNOSIS — Z794 Long term (current) use of insulin: Secondary | ICD-10-CM

## 2022-01-22 DIAGNOSIS — O24119 Pre-existing diabetes mellitus, type 2, in pregnancy, unspecified trimester: Secondary | ICD-10-CM

## 2022-01-22 MED ORDER — METFORMIN HCL 1000 MG PO TABS: 1000.0000 mg | ORAL_TABLET | Freq: Two times a day (BID) | ORAL | 1 refills | Status: DC

## 2022-01-22 NOTE — Progress Notes (Signed)
   PRENATAL VISIT NOTE  Subjective:  Deborah Pierce is a 31 y.o. G2P0010 at 63w2dbeing seen today for ongoing prenatal care.  She is currently monitored for the following issues for this high-risk pregnancy and has Controlled type 2 diabetes mellitus without complication, with long-term current use of insulin ([place]; Diabetic peripheral neuropathy ([place]; Class 1 obesity due to excess calories with serious comorbidity and body mass index (BMI) of 34.0 to 34.9 in adult; Depression with anxiety; Supervision of high risk pregnancy, antepartum; Mixed hyperlipidemia; Vitamin D deficiency; and Type 2 diabetes mellitus affecting pregnancy, antepartum on their problem list.  Patient reports no complaints.  Contractions: Not present. Vag. Bleeding: None.  Movement: Absent. Denies leaking of fluid.   The following portions of the patient's history were reviewed and updated as appropriate: allergies, current medications, past family history, past medical history, past social history, past surgical history and problem list.   Objective:   Vitals:   01/22/22 0842  BP: 126/82  Pulse: 83  Weight: 219 lb (99.3 kg)    Fetal Status: Fetal Heart Rate (bpm): 147   Movement: Absent     General:  Alert, oriented and cooperative. Patient is in no acute distress.  Skin: Skin is warm and dry. No rash noted.   Cardiovascular: Normal heart rate noted  Respiratory: Normal respiratory effort, no problems with respiration noted  Abdomen: Soft, gravid, appropriate for gestational age.  Pain/Pressure: Absent     Pelvic: Cervical exam deferred        Extremities: Normal range of motion.  Edema: None  Mental Status: Normal mood and affect. Normal behavior. Normal judgment and thought content.   Assessment and Plan:  Pregnancy: G2P0010 at 119w2d. Type 2 diabetes mellitus affecting pregnancy, antepartum 2. Controlled type 2 diabetes mellitus without complication, with long-term current use of insulin (HCC) 3. [redacted] weeks  gestation of pregnancy 4. Supervision of high risk pregnancy, antepartum Reviewed continuous BG logs, fasting in 100-115s, some elevated PPs/during the day. Increased Metformin to 1000 mg bid, will continue Tresiba 45 u qhs, Novolog SS. Awaiting initiation of Omnipod.  A1C to be checked today.  Sent referral for eye exam and fetal ECHO.  AFP to be done today, had LR NIPS. Anatomy scan already scheduled. - AFP, Serum, Open Spina Bifida - Hemoglobin A1c - [place] Echocardiography; Future - Ambulatory referral to Ophthalmology - metFORMIN (GLUCOPHAGE) 1000 MG tablet; Take 1 tablet (1,000 mg total) by mouth 2 (two) times daily with a meal.  Dispense: 180 tablet; Refill: 1  No other complaints or concerns.  Routine obstetric precautions reviewed.  Please refer to After Visit Summary for other counseling recommendations.   Return in about 4 weeks (around 02/19/2022) for OFFICE OB VISIT (MD only).  Future Appointments  Date Time Provider De[place]10/31/2023  2:30 PM [facility]10/31/2023  2:45 PM [facility] US4 [facility]11/12/2021  8:35 AM PiAletha HalimMD [facility]  02/20/2022  9:45 AM KoRalene BatheMD [facility] None    UgVerita SchneidersMD

## 2022-01-23 LAB — AFP, SERUM, OPEN SPINA BIFIDA
AFP MoM: 1.21
AFP Value: 27.1 ng/mL
Gest. Age on Collection Date: 16.3 weeks
Maternal Age At EDD: 31.2 yr
OSBR Risk 1 IN: 1881
Test Results:: NEGATIVE
Weight: 219 [lb_av]

## 2022-01-23 LAB — HEMOGLOBIN A1C
Est. average glucose Bld gHb Est-mCnc: 146 mg/dL
Hgb A1c MFr Bld: 6.7 % — ABNORMAL HIGH (ref 4.8–5.6)

## 2022-02-04 ENCOUNTER — Telehealth: Payer: Self-pay | Admitting: Nurse Practitioner

## 2022-02-04 NOTE — Telephone Encounter (Signed)
Patient asked [place] to cancel her new pt appt and close referral because she got scheduled with her omni pod trainer

## 2022-02-09 ENCOUNTER — Ambulatory Visit: Payer: No Typology Code available for payment source | Admitting: Nurse Practitioner

## 2022-02-10 ENCOUNTER — Ambulatory Visit: Payer: No Typology Code available for payment source | Admitting: *Deleted

## 2022-02-10 ENCOUNTER — Encounter: Payer: Self-pay | Admitting: *Deleted

## 2022-02-10 ENCOUNTER — Ambulatory Visit: Payer: No Typology Code available for payment source | Attending: Family Medicine

## 2022-02-10 VITALS — BP 120/61 | HR 80

## 2022-02-10 DIAGNOSIS — O99212 Obesity complicating pregnancy, second trimester: Secondary | ICD-10-CM | POA: Diagnosis not present

## 2022-02-10 DIAGNOSIS — O0990 Supervision of high risk pregnancy, unspecified, unspecified trimester: Secondary | ICD-10-CM | POA: Insufficient documentation

## 2022-02-10 DIAGNOSIS — E669 Obesity, unspecified: Secondary | ICD-10-CM | POA: Diagnosis not present

## 2022-02-10 DIAGNOSIS — O2412 Pre-existing diabetes mellitus, type 2, in childbirth: Secondary | ICD-10-CM

## 2022-02-10 DIAGNOSIS — Z3A19 19 weeks gestation of pregnancy: Secondary | ICD-10-CM | POA: Diagnosis not present

## 2022-02-10 DIAGNOSIS — O24119 Pre-existing diabetes mellitus, type 2, in pregnancy, unspecified trimester: Secondary | ICD-10-CM | POA: Insufficient documentation

## 2022-02-11 ENCOUNTER — Other Ambulatory Visit: Payer: Self-pay | Admitting: *Deleted

## 2022-02-11 DIAGNOSIS — O99212 Obesity complicating pregnancy, second trimester: Secondary | ICD-10-CM

## 2022-02-11 DIAGNOSIS — O24112 Pre-existing diabetes mellitus, type 2, in pregnancy, second trimester: Secondary | ICD-10-CM

## 2022-02-12 ENCOUNTER — Ambulatory Visit: Payer: No Typology Code available for payment source | Admitting: Dermatology

## 2022-02-19 ENCOUNTER — Ambulatory Visit (INDEPENDENT_AMBULATORY_CARE_PROVIDER_SITE_OTHER): Payer: No Typology Code available for payment source | Admitting: Obstetrics and Gynecology

## 2022-02-19 VITALS — BP 133/76 | HR 99 | Wt 223.0 lb

## 2022-02-19 DIAGNOSIS — O0990 Supervision of high risk pregnancy, unspecified, unspecified trimester: Secondary | ICD-10-CM

## 2022-02-19 DIAGNOSIS — Z6834 Body mass index (BMI) 34.0-34.9, adult: Secondary | ICD-10-CM

## 2022-02-19 DIAGNOSIS — Z794 Long term (current) use of insulin: Secondary | ICD-10-CM

## 2022-02-19 DIAGNOSIS — E119 Type 2 diabetes mellitus without complications: Secondary | ICD-10-CM

## 2022-02-19 DIAGNOSIS — Z3A20 20 weeks gestation of pregnancy: Secondary | ICD-10-CM

## 2022-02-19 DIAGNOSIS — O99213 Obesity complicating pregnancy, third trimester: Secondary | ICD-10-CM | POA: Insufficient documentation

## 2022-02-19 DIAGNOSIS — O0992 Supervision of high risk pregnancy, unspecified, second trimester: Secondary | ICD-10-CM

## 2022-02-19 DIAGNOSIS — O99212 Obesity complicating pregnancy, second trimester: Secondary | ICD-10-CM

## 2022-02-19 DIAGNOSIS — E6609 Other obesity due to excess calories: Secondary | ICD-10-CM

## 2022-02-19 NOTE — Progress Notes (Signed)
    PRENATAL VISIT NOTE  Subjective:  Deborah Pierce is a 31 y.o. G2P0010 at 21w2dbeing seen today for ongoing prenatal care.  She is currently monitored for the following issues for this high-risk pregnancy and has Controlled type 2 diabetes mellitus without complication, with long-term current use of insulin ([place]; Diabetic peripheral neuropathy ([place]; Class 1 obesity due to excess calories with serious comorbidity and body mass index (BMI) of 34.0 to 34.9 in adult; Depression with anxiety; Supervision of high risk pregnancy, antepartum; Mixed hyperlipidemia; Vitamin D deficiency; Type 2 diabetes mellitus affecting pregnancy, antepartum; and Obesity affecting pregnancy in second trimester on their problem list.  Patient reports no complaints.  Contractions: Not present. Vag. Bleeding: None.  Movement: Present. Denies leaking of fluid.   The following portions of the patient's history were reviewed and updated as appropriate: allergies, current medications, past family history, past medical history, past social history, past surgical history and problem list.   Objective:   Vitals:   02/19/22 0832  BP: 133/76  Pulse: 99  Weight: 223 lb (101.2 kg)    Fetal Status: Fetal Heart Rate (bpm): 150   Movement: Present     General:  Alert, oriented and cooperative. Patient is in no acute distress.  Skin: Skin is warm and dry. No rash noted.   Cardiovascular: Normal heart rate noted  Respiratory: Normal respiratory effort, no problems with respiration noted  Abdomen: Soft, gravid, appropriate for gestational age.  Pain/Pressure: Absent     Pelvic: Cervical exam deferred        Extremities: Normal range of motion.  Edema: None  Mental Status: Normal mood and affect. Normal behavior. Normal judgment and thought content.   Assessment and Plan:  Pregnancy: G2P0010 at 210w2d. Supervision of high risk pregnancy, antepartum S/p normal anatomy u/s: 10/31 65%, ac 77%, afi wnl. Confirms on low dose  ASA  2. DM2 Got omnipold about two weeks ago. Uses about 20-25u qday with it and also still on the tresiba (45u qhs) and metformin 1000 bid. AM fastings 80s-100s and 2 hour post prandials 120s-130s so slightly above goals.  Has [facility] appt next week and plan is to try and get her on the omnipod only. F/u screening fetal echo no 11/30  3. BMI 30s Pt back at pre-pregnancy weight   4. [redacted] weeks gestation of pregnancy  Preterm labor symptoms and general obstetric precautions including but not limited to vaginal bleeding, contractions, leaking of fluid and fetal movement were reviewed in detail with the patient. Please refer to After Visit Summary for other counseling recommendations.   No follow-ups on file.  Future Appointments  Date Time Provider De[place]11/01/2022  9:45 AM KoRalene BatheMD [facility] None  03/17/2022  7:45 AM [facility] NURSE [facility]12/08/2021  8:00 AM [facility] US1 [facility]12/10/2021  8:55 AM Anyanwu, UgSallyanne HaversMD [facility]    ChAletha HalimMD

## 2022-02-19 NOTE — Patient Instructions (Signed)
Call us for any blood pressures of 140 or higher for the top number and/or 90 or higher for the bottom number

## 2022-02-19 NOTE — Progress Notes (Signed)
ROB [redacted]w[redacted]d No Concerns

## 2022-02-19 NOTE — Progress Notes (Signed)
ROB

## 2022-02-20 ENCOUNTER — Ambulatory Visit (INDEPENDENT_AMBULATORY_CARE_PROVIDER_SITE_OTHER): Payer: No Typology Code available for payment source | Admitting: Dermatology

## 2022-02-20 DIAGNOSIS — L918 Other hypertrophic disorders of the skin: Secondary | ICD-10-CM

## 2022-02-20 DIAGNOSIS — D239 Other benign neoplasm of skin, unspecified: Secondary | ICD-10-CM

## 2022-02-20 DIAGNOSIS — D225 Melanocytic nevi of trunk: Secondary | ICD-10-CM

## 2022-02-20 DIAGNOSIS — Z1283 Encounter for screening for malignant neoplasm of skin: Secondary | ICD-10-CM | POA: Diagnosis not present

## 2022-02-20 DIAGNOSIS — L814 Other melanin hyperpigmentation: Secondary | ICD-10-CM

## 2022-02-20 DIAGNOSIS — Z808 Family history of malignant neoplasm of other organs or systems: Secondary | ICD-10-CM

## 2022-02-20 DIAGNOSIS — D2272 Melanocytic nevi of left lower limb, including hip: Secondary | ICD-10-CM

## 2022-02-20 DIAGNOSIS — D229 Melanocytic nevi, unspecified: Secondary | ICD-10-CM

## 2022-02-20 DIAGNOSIS — L578 Other skin changes due to chronic exposure to nonionizing radiation: Secondary | ICD-10-CM | POA: Diagnosis not present

## 2022-02-20 DIAGNOSIS — D485 Neoplasm of uncertain behavior of skin: Secondary | ICD-10-CM

## 2022-02-20 DIAGNOSIS — L821 Other seborrheic keratosis: Secondary | ICD-10-CM

## 2022-02-20 HISTORY — DX: Other benign neoplasm of skin, unspecified: D23.9

## 2022-02-20 NOTE — Patient Instructions (Addendum)
Wound Care Instructions  Cleanse wound gently with soap and water once a day then pat dry with clean gauze. Apply a thin coat of Petrolatum (petroleum jelly, "Vaseline") over the wound (unless you have an allergy to this). We recommend that you use a new, sterile tube of Vaseline. Do not pick or remove scabs. Do not remove the yellow or white "healing tissue" from the base of the wound.  Cover the wound with fresh, clean, nonstick gauze and secure with paper tape. You may use Band-Aids in place of gauze and tape if the wound is small enough, but would recommend trimming much of the tape off as there is often too much. Sometimes Band-Aids can irritate the skin.  You should call the office for your biopsy report after 1 week if you have not already been contacted.  If you experience any problems, such as abnormal amounts of bleeding, swelling, significant bruising, significant pain, or evidence of infection, please call the office immediately.  FOR ADULT SURGERY PATIENTS: If you need something for pain relief you may take 1 extra strength Tylenol (acetaminophen) AND 2 Ibuprofen (200mg each) together every 4 hours as needed for pain. (do not take these if you are allergic to them or if you have a reason you should not take them.) Typically, you may only need pain medication for 1 to 3 days.   Melanoma ABCDEs  Melanoma is the most dangerous type of skin cancer, and is the leading cause of death from skin disease.  You are more likely to develop melanoma if you: Have light-colored skin, light-colored eyes, or red or blond hair Spend a lot of time in the sun Tan regularly, either outdoors or in a tanning bed Have had blistering sunburns, especially during childhood Have a close family member who has had a melanoma Have atypical moles or large birthmarks  Early detection of melanoma is key since treatment is typically straightforward and cure rates are extremely high if we catch it early.   The  first sign of melanoma is often a change in a mole or a new dark spot.  The ABCDE system is a way of remembering the signs of melanoma.  A for asymmetry:  The two halves do not match. B for border:  The edges of the growth are irregular. C for color:  A mixture of colors are present instead of an even brown color. D for diameter:  Melanomas are usually (but not always) greater than 6mm - the size of a pencil eraser. E for evolution:  The spot keeps changing in size, shape, and color.  Please check your skin once per month between visits. You can use a small mirror in front and a large mirror behind you to keep an eye on the back side or your body.   If you see any new or changing lesions before your next follow-up, please call to schedule a visit.  Please continue daily skin protection including broad spectrum sunscreen SPF 30+ to sun-exposed areas, reapplying every 2 hours as needed when you're outdoors.    Due to recent changes in healthcare laws, you may see results of your pathology and/or laboratory studies on MyChart before the doctors have had a chance to review them. We understand that in some cases there may be results that are confusing or concerning to you. Please understand that not all results are received at the same time and often the doctors may need to interpret multiple results in order to provide you   with the best plan of care or course of treatment. Therefore, we ask that you please give us 2 business days to thoroughly review all your results before contacting the office for clarification. Should we see a critical lab result, you will be contacted sooner.   If You Need Anything After Your Visit  If you have any questions or concerns for your doctor, please call our main line at [phone] and press option 4 to reach your doctor's medical assistant. If no one answers, please leave a voicemail as directed and we will return your call as soon as possible. Messages left after 4  pm will be answered the following business day.   You may also send us a message via MyChart. We typically respond to MyChart messages within 1-2 business days.  For prescription refills, please ask your pharmacy to contact our office. Our fax number is [phone].  If you have an urgent issue when the clinic is closed that cannot wait until the next business day, you can page your doctor at the number below.    Please note that while we do our best to be available for urgent issues outside of office hours, we are not available 24/7.   If you have an urgent issue and are unable to reach us, you may choose to seek medical care at your doctor's office, retail clinic, urgent care center, or emergency room.  If you have a medical emergency, please immediately call 911 or go to the emergency department.  Pager Numbers  - Dr. Kowalski: [phone]  - Dr. Moye: [phone]  - Dr. Stewart: [phone]  In the event of inclement weather, please call our main line at [phone] for an update on the status of any delays or closures.  Dermatology Medication Tips: Please keep the boxes that topical medications come in in order to help keep track of the instructions about where and how to use these. Pharmacies typically print the medication instructions only on the boxes and not directly on the medication tubes.   If your medication is too expensive, please contact our office at [phone] option 4 or send us a message through MyChart.   We are unable to tell what your co-pay for medications will be in advance as this is different depending on your insurance coverage. However, we may be able to find a substitute medication at lower cost or fill out paperwork to get insurance to cover a needed medication.   If a prior authorization is required to get your medication covered by your insurance company, please allow us 1-2 business days to complete this process.  Drug prices often vary  depending on where the prescription is filled and some pharmacies may offer cheaper prices.  The website www.goodrx.com contains coupons for medications through different pharmacies. The prices here do not account for what the cost may be with help from insurance (it may be cheaper with your insurance), but the website can give you the price if you did not use any insurance.  - You can print the associated coupon and take it with your prescription to the pharmacy.  - You may also stop by our office during regular business hours and pick up a GoodRx coupon card.  - If you need your prescription sent electronically to a different pharmacy, notify our office through [facility] MyChart or by phone at [phone] option 4.     Si Usted Necesita Algo Despus de Su Visita  Tambin puede enviarnos un mensaje a travs   de MyChart. Por lo general respondemos a los mensajes de MyChart en el transcurso de 1 a 2 das hbiles.  Para renovar recetas, por favor pida a su farmacia que se ponga en contacto con nuestra oficina. Nuestro nmero de fax es el [phone].  Si tiene un asunto urgente cuando la clnica est cerrada y que no puede esperar hasta el siguiente da hbil, puede llamar/localizar a su doctor(a) al nmero que aparece a continuacin.   Por favor, tenga en cuenta que aunque hacemos todo lo posible para estar disponibles para asuntos urgentes fuera del horario de oficina, no estamos disponibles las 24 horas del da, los 7 das de la semana.   Si tiene un problema urgente y no puede comunicarse con nosotros, puede optar por buscar atencin mdica  en el consultorio de su doctor(a), en una clnica privada, en un centro de atencin urgente o en una sala de emergencias.  Si tiene una emergencia mdica, por favor llame inmediatamente al 911 o vaya a la sala de emergencias.  Nmeros de bper  - Dr. Kowalski: [phone]  - Dra. Moye: [phone]  - Dra. Stewart: [phone]  En caso de  inclemencias del tiempo, por favor llame a nuestra lnea principal al [phone] para una actualizacin sobre el estado de cualquier retraso o cierre.  Consejos para la medicacin en dermatologa: Por favor, guarde las cajas en las que vienen los medicamentos de uso tpico para ayudarle a seguir las instrucciones sobre dnde y cmo usarlos. Las farmacias generalmente imprimen las instrucciones del medicamento slo en las cajas y no directamente en los tubos del medicamento.   Si su medicamento es muy caro, por favor, pngase en contacto con nuestra oficina llamando al [phone] y presione la opcin 4 o envenos un mensaje a travs de MyChart.   No podemos decirle cul ser su copago por los medicamentos por adelantado ya que esto es diferente dependiendo de la cobertura de su seguro. Sin embargo, es posible que podamos encontrar un medicamento sustituto a menor costo o llenar un formulario para que el seguro cubra el medicamento que se considera necesario.   Si se requiere una autorizacin previa para que su compaa de seguros cubra su medicamento, por favor permtanos de 1 a 2 das hbiles para completar este proceso.  Los precios de los medicamentos varan con frecuencia dependiendo del lugar de dnde se surte la receta y alguna farmacias pueden ofrecer precios ms baratos.  El sitio web www.goodrx.com tiene cupones para medicamentos de diferentes farmacias. Los precios aqu no tienen en cuenta lo que podra costar con la ayuda del seguro (puede ser ms barato con su seguro), pero el sitio web puede darle el precio si no utiliz ningn seguro.  - Puede imprimir el cupn correspondiente y llevarlo con su receta a la farmacia.  - Tambin puede pasar por nuestra oficina durante el horario de atencin regular y recoger una tarjeta de cupones de GoodRx.  - Si necesita que su receta se enve electrnicamente a una farmacia diferente, informe a nuestra oficina a travs de MyChart de [facility] o  por telfono llamando al [phone] y presione la opcin 4.  

## 2022-02-20 NOTE — Progress Notes (Signed)
New Patient Visit  Subjective  Deborah Pierce is a 31 y.o. female who presents for the following: TBSE (The patient presents for Total-Body Skin Exam (TBSE) for skin cancer screening and mole check.  The patient has spots, moles and lesions to be evaluated, some may be new or changing and the patient has concerns that these could be cancer./). Fhx skin cancer, no personal hx skin cancer. Family history of skin cancer - what type(s): melanoma x 2 - who affected: mother  The following portions of the chart were reviewed this encounter and updated as appropriate:   Tobacco  Allergies  Meds  Problems  Med Hx  Surg Hx  Fam Hx      Review of Systems:  No other skin or systemic complaints except as noted in HPI or Assessment and Plan.  Objective  Well appearing patient in no apparent distress; mood and affect are within normal limits.  A full examination was performed including scalp, head, eyes, ears, nose, lips, neck, chest, axillae, abdomen, back, buttocks, bilateral upper extremities, bilateral lower extremities, hands, feet, fingers, toes, fingernails, and toenails. All findings within normal limits unless otherwise noted below.  Right to mid upper back paraspinal 0.9 x 0.5 cm irregular brown macule   Left mid back paraspinal 0.7 x 0.3 cm irregular brown macule  left lateral buttocks 0.5 x 0.3 cm irregular brown macule  right 2nd toe dorsum 0.2 cm regular brown macule         Assessment & Plan  Neoplasm of uncertain behavior of skin (3) Right to mid upper back paraspinal Epidermal / dermal shaving  Lesion diameter (cm):  0.9 Informed consent: discussed and consent obtained   Timeout: patient name, date of birth, surgical site, and procedure verified   Procedure prep:  Patient was prepped and draped in usual sterile fashion Prep type:  Isopropyl alcohol Anesthesia: the lesion was anesthetized in a standard fashion   Anesthetic:  1% lidocaine w/ epinephrine  1-100,000 buffered w/ 8.4% NaHCO3 Instrument used: flexible razor blade   Hemostasis achieved with: pressure, aluminum chloride and electrodesiccation   Outcome: patient tolerated procedure well   Post-procedure details: sterile dressing applied and wound care instructions given   Dressing type: bandage and petrolatum    Specimen 1 - Surgical pathology Differential Diagnosis: Nevus vs Dysplastic Nevus Check Margins: No 0.9 x 0.5 cm irregular brown macule  Left mid back paraspinal Epidermal / dermal shaving  Lesion diameter (cm):  0.7 Informed consent: discussed and consent obtained   Timeout: patient name, date of birth, surgical site, and procedure verified   Procedure prep:  Patient was prepped and draped in usual sterile fashion Prep type:  Isopropyl alcohol Anesthesia: the lesion was anesthetized in a standard fashion   Anesthetic:  1% lidocaine w/ epinephrine 1-100,000 buffered w/ 8.4% NaHCO3 Instrument used: flexible razor blade   Hemostasis achieved with: pressure, aluminum chloride and electrodesiccation   Outcome: patient tolerated procedure well   Post-procedure details: sterile dressing applied and wound care instructions given   Dressing type: bandage and petrolatum    Specimen 2 - Surgical pathology Differential Diagnosis: Nevus vs Dysplastic Nevus Check Margins: No 0.7 x 0.3 cm irregular brown macule  left lateral buttocks Epidermal / dermal shaving  Lesion diameter (cm):  0.5 Informed consent: discussed and consent obtained   Timeout: patient name, date of birth, surgical site, and procedure verified   Procedure prep:  Patient was prepped and draped in usual sterile fashion Prep type:  Isopropyl  alcohol Anesthesia: the lesion was anesthetized in a standard fashion   Anesthetic:  1% lidocaine w/ epinephrine 1-100,000 buffered w/ 8.4% NaHCO3 Instrument used: flexible razor blade   Hemostasis achieved with: pressure, aluminum chloride and electrodesiccation    Outcome: patient tolerated procedure well   Post-procedure details: sterile dressing applied and wound care instructions given   Dressing type: bandage and petrolatum    Specimen 3 - Surgical pathology Differential Diagnosis: Nevus vs Dysplastic Nevus Check Margins: No 0.5 x 0.3 cm irregular brown macule  Nevus right 2nd toe dorsum See photo Benign-appearing.  Observation.  Call clinic for new or changing lesions.  Recommend daily use of broad spectrum spf 30+ sunscreen to sun-exposed areas.   Lentigines - Scattered tan macules - Due to sun exposure - Benign-appearing, observe - Recommend daily broad spectrum sunscreen SPF 30+ to sun-exposed areas, reapply every 2 hours as needed. - Call for any changes  Seborrheic Keratoses - Stuck-on, waxy, tan-brown papules and/or plaques  - Benign-appearing - Discussed benign etiology and prognosis. - Observe - Call for any changes  Melanocytic Nevi - Tan-brown and/or pink-flesh-colored symmetric macules and papules - Benign appearing on exam today - Observation - Call clinic for new or changing moles - Recommend daily use of broad spectrum spf 30+ sunscreen to sun-exposed areas.   Hemangiomas - Red papules - Discussed benign nature - Observe - Call for any changes  Actinic Damage - Chronic condition, secondary to cumulative UV/sun exposure - diffuse scaly erythematous macules with underlying dyspigmentation - Recommend daily broad spectrum sunscreen SPF 30+ to sun-exposed areas, reapply every 2 hours as needed.  - Staying in the shade or wearing long sleeves, sun glasses (UVA+UVB protection) and wide brim hats (4-inch brim around the entire circumference of the hat) are also recommended for sun protection.  - Call for new or changing lesions.  Skin cancer screening performed today.  Family history of skin cancer - what type(s): melanoma x 2 - who affected: mother  Acrochordons (Skin Tags) - Removal desired by patient -  Fleshy, skin-colored pedunculated papules - Benign appearing.  - Patient desires removal. Reviewed that this is not covered by insurance and they will be charged a cosmetic fee for removal. Patient signed non-covered consent.  - Prior to the procedure, reviewed the expected small wound. Also reviewed the risk of leaving a small scar and the small risk of infection.  PROCEDURE - The areas were prepped with isopropyl alcohol. A small amount of lidocaine 1% with epinephrine was injected at the base of each lesion to achieve good local anesthesia. The skin tags were removed using a snip technique. Aluminum chloride was used for hemostasis. Petrolatum and a bandage were applied. The procedure was tolerated well. - Wound care was reviewed with the patient. They were advised to call with any concerns. Total number of treated acrochordons 3  Return if symptoms worsen or fail to improve.  Graciella Belton, RMA, am acting as scribe for Sarina Ser, MD . Documentation: I have reviewed the above documentation for accuracy and completeness, and I agree with the above.  Sarina Ser, MD

## 2022-02-22 ENCOUNTER — Other Ambulatory Visit: Payer: Self-pay | Admitting: Family

## 2022-02-22 DIAGNOSIS — F418 Other specified anxiety disorders: Secondary | ICD-10-CM

## 2022-02-28 ENCOUNTER — Encounter: Payer: Self-pay | Admitting: Dermatology

## 2022-03-03 ENCOUNTER — Telehealth: Payer: Self-pay

## 2022-03-03 NOTE — Telephone Encounter (Signed)
-----   Message from Ralene Bathe, MD sent at 02/27/2022 11:44 AM EST ----- Diagnosis 1. 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 Skin , left mid back paraspinal DYSPLASTIC NEVUS WITH MODERATE TO SEVERE ATYPIA, PERIPHERAL MARGIN INVOLVED, SEE DESCRIPTION 3. Skin , left lateral buttocks DYSPLASTIC COMPOUND NEVUS WITH MILD ATYPIA, DEEP MARGIN INVOLVED  1&2 - both Severe Dysplastic Schedule Surgery X 2 (Pt is pregnant and recommend scheduling surgery after pt delivers) 3- Mild dysplastic Recheck next visit

## 2022-03-03 NOTE — Telephone Encounter (Signed)
Patient advised of BX results and scheduled for surgery in May.  All questions answered. aw

## 2022-03-17 ENCOUNTER — Ambulatory Visit: Payer: No Typology Code available for payment source | Admitting: *Deleted

## 2022-03-17 ENCOUNTER — Ambulatory Visit: Payer: No Typology Code available for payment source | Attending: Obstetrics and Gynecology

## 2022-03-17 ENCOUNTER — Other Ambulatory Visit: Payer: Self-pay | Admitting: *Deleted

## 2022-03-17 VITALS — BP 127/70 | HR 91

## 2022-03-17 DIAGNOSIS — E119 Type 2 diabetes mellitus without complications: Secondary | ICD-10-CM

## 2022-03-17 DIAGNOSIS — O24119 Pre-existing diabetes mellitus, type 2, in pregnancy, unspecified trimester: Secondary | ICD-10-CM | POA: Insufficient documentation

## 2022-03-17 DIAGNOSIS — O0990 Supervision of high risk pregnancy, unspecified, unspecified trimester: Secondary | ICD-10-CM | POA: Diagnosis present

## 2022-03-17 DIAGNOSIS — Z3A24 24 weeks gestation of pregnancy: Secondary | ICD-10-CM

## 2022-03-17 DIAGNOSIS — Z794 Long term (current) use of insulin: Secondary | ICD-10-CM

## 2022-03-17 DIAGNOSIS — E669 Obesity, unspecified: Secondary | ICD-10-CM

## 2022-03-17 DIAGNOSIS — O24112 Pre-existing diabetes mellitus, type 2, in pregnancy, second trimester: Secondary | ICD-10-CM

## 2022-03-17 DIAGNOSIS — Z7984 Long term (current) use of oral hypoglycemic drugs: Secondary | ICD-10-CM

## 2022-03-17 DIAGNOSIS — O99212 Obesity complicating pregnancy, second trimester: Secondary | ICD-10-CM

## 2022-03-19 ENCOUNTER — Ambulatory Visit (INDEPENDENT_AMBULATORY_CARE_PROVIDER_SITE_OTHER): Payer: No Typology Code available for payment source | Admitting: Obstetrics & Gynecology

## 2022-03-19 VITALS — BP 122/75 | HR 88 | Wt 225.0 lb

## 2022-03-19 DIAGNOSIS — O24119 Pre-existing diabetes mellitus, type 2, in pregnancy, unspecified trimester: Secondary | ICD-10-CM

## 2022-03-19 DIAGNOSIS — O0990 Supervision of high risk pregnancy, unspecified, unspecified trimester: Secondary | ICD-10-CM

## 2022-03-19 DIAGNOSIS — O0992 Supervision of high risk pregnancy, unspecified, second trimester: Secondary | ICD-10-CM

## 2022-03-19 DIAGNOSIS — O24112 Pre-existing diabetes mellitus, type 2, in pregnancy, second trimester: Secondary | ICD-10-CM

## 2022-03-19 DIAGNOSIS — Z3A24 24 weeks gestation of pregnancy: Secondary | ICD-10-CM

## 2022-03-19 NOTE — Patient Instructions (Signed)
AREA PEDIATRIC/FAMILY PRACTICE PHYSICIANS  [place] ([address]) [facility] Chambliss, MD; Eniola, MD; Hale, MD; Hensel, MD; McDiarmid, MD; McIntyer, MD; Neal, MD; Walden, MD [address] [place], [place] [address] [phone] Mon-Fri 8:30-12:30, 1:30-5:00 Providers come to see babies at [facility] Accepting Medicaid [facility] providers who accept newborns: Koirala, MD; Morrow, MD; Wolters, MD [address], [place], [place] [address] [phone] Mon-Fri 8:00-5:30 Babies seen by providers at [facility] Does NOT accept Medicaid Please call early in hospitalization for appointment (limited availability)  [facility] Mulberry, MD [address] [place], [place] [address] [phone] Mon, Tue, Thur, Fri 8:30-5:00, Wed 10:00-7:00 (closed 1-2pm) Babies seen by [facility] providers Accepting Medicaid Rubin - Pediatrician Rubin, MD [address], [place], [place] [address] [phone] Mon-Fri 8:30-5:00, Sat 8:30-12:00 Provider comes to see babies at [facility] Accepting Medicaid Must have been referred from current patients or contacted office prior to delivery [facility] ([facility]) Brown, MD; Chandler, MD; Ettefagh, MD; Grant, MD; Lester, MD; McCormick, MD; McQueen, MD; Prose, MD; Simha, MD; Stanley, MD; Stryffeler, NP; Tebben, NP [address], [place], [place] [address] [phone] Mon, Tue, Thur, Fri 8:30-5:30, Wed 9:30-5:30, Sat 8:30-12:30 Babies seen by [facility] providers Accepting Medicaid Only accepting infants of first-time parents or siblings of current patients Hospital discharge coordinator will make follow-up appointment Jack Amos [address], [place], [place]  [address] [phone]   Fax - [phone] [facility] [address], [place], [place]  [address] Phone - [phone]   Fax - [phone] Shilpa Gosrani [address], [place], [place]  [address] [phone]  [place] ([address]) [facility] Bates, MD; Brassfield, MD; Cooper, Cox, MD; MD; Davis, MD; Dovico, MD; Ettefaugh, MD; Little, MD; Lowe, MD; Keiffer, MD; Melvin, MD; Sumner, MD; Williams, MD [address], [place], [place] [address] [phone] Mon-Fri 8:30-5:00 (extended evenings Mon-Thur as needed), Sat-Sun 10:00-1:00 Providers come to see babies at [facility] Accepting Medicaid for families of first-time babies and families with all children in the household age 3 and under. Must register with office prior to making appointment (M-F only). [facility] Henson, NP; Knapp, MD; Lalonde, MD; Tysinger, PA [address] [place], [place] [address] [phone] Mon-Fri 8:00-5:00 Babies seen by providers at [facility] Does NOT accept Medicaid/Commercial Insurance Only [facility] ([facility])  Artis, MD; Barnes, MD; Bratton, MD; Coccaro, MD; Lockett Gardner, MD; Kramer, MD; Marshall, MD; Netherton, MD; Poleto, MD; Skinner, MD [address] [place], [place] [address] [phone] Mon-Fri 8:30-5:30, Sat (Oct.-Mar.) 9:00-1:00 Babies seen by providers at [facility] Accepting Medicaid  [place] ([address]) [facility] Reid, MD; Warner, MD [address], [place], [place] [address] [phone] Mon-Fri 8:30-5:00, Sat 8:30-12:00 Providers come to see babies at [facility] Does NOT accept Medicaid [facility] Becker, PA; Hagler, MD; Scifres, PA; Sun, MD; Swayne, MD [address], [place], [place] [address] [phone] Mon-Fri 8:00-5:00 Babies seen by providers at [facility] Does NOT accept Medicaid Only accepting babies of parents who  are patients Please call early in hospitalization for appointment (limited availability) [facility] Clark, MD; Frye, MD; Kelleher, MD; Mack, NP; Miller, MD; O'Keller, MD; Patterson, NP; Pudlo, MD; Puzio, MD; Thomas, MD; Tucker, MD; Twiselton, MD [address], [place], [place] [address] [phone] Mon-Fri 8:00-5:00, Sat 9:00-12:00 Providers come to see babies at [facility] Does NOT accept Medicaid  [place] ([address]) [facility] providers accepting new patients: Brake, NP; Wharton, PA [address], [place], [place] [address] [phone] Mon-Fri 8:00-5:00 Babies seen by providers at [facility] Does NOT accept Medicaid Only accepting babies of parents who are patients Please call early in hospitalization for appointment (limited availability) [facility] Gay, MD; Quinlan, MD [address] [place], [place] [address] [phone] (press 1 to schedule appointment) Mon-Fri 8:00-5:00 Providers come to see babies at [facility] Does NOT accept Medicaid [facility] Mazer, MD [address] [place], [place] [address] [phone] Mon-Fri 8:30-5:00 (lunch 12:30-1:00), extended hours by appointment only Wed 5:00-6:30 Babies seen by [facility] providers Accepting Medicaid [facility] Banks, MD; Jordan, MD; Koberlein, MD [address], [place], [place] [address] [phone] Mon-Fri 8:00-5:00 Babies seen by [facility] providers Does NOT accept Medicaid [facility] Parker, MD; Hunter, MD; Wallace, DO [address] [place], [place] [address] [phone] Mon-Fri 8:00-5:00 Babies seen by [facility] providers Does NOT accept Medicaid [facility] Brandon, PA; Brecken, PA; Christy, NP; Dees, MD; DeClaire, MD; DeWeese, MD; Hansen, NP; Mills, NP; Parrish, NP; Smoot, NP; Summer, MD; Vapne,  MD [address] [place], [place] [address] [phone] Mon-Fri 8:30-5:00, Sat 10:00-1:00 Providers come to see babies at [facility] Does NOT accept Medicaid Free prenatal information session Tuesdays at 4:45pm [facility] Bouska, MD; Gordon, PA; Jeffery, PA; Weber, PA [address] [place] [address] [phone] Mon-Fri 7:30-5:30 Babies seen by [facility] providers [facility] [address], [place], [place]  [address] [phone]   Fax - [phone]  [place] ([address] & [address]) [facility] [place] [address] [place], [place] [address] [phone] Mon-Thur 8:00-6:00 Providers come to see babies at [facility] Accepting Medicaid [facility] Anderson, NP; Badger, MD; Beal, PA; Spencer, PA [address] [place], [place] [address] [phone] Mon-Thur 7:30-7:30, Fri 7:30-4:30 Babies seen by [facility] providers Accepting Medicaid [facility] Agbuya, MD; Klett, NP; Romgoolam, MD [address], [place], [place] [address] [phone] Mon-Fri 8:30-5:00, Sat 8:30-12:00 Providers come to see babies at [facility] Accepting Medicaid Must have "Meet & Greet" appointment at office prior to delivery [facility] ([facility] McCord, MD; Wallace, MD; Wood, MD [address], [place], [place] [address] [phone] Mon-Wed 8:00-6:00, Thur-Fri 8:00-5:00, Sat 9:00-12:00 Providers come to see babies at [facility] Does NOT accept Medicaid Only accepting siblings of current patients [facility]  [address], [place], [place]  [address] [phone]   Fax - [phone] [facility] [address], [place], [place]  [address] [phone]   Fax -  [phone]  [place] ([address] & [address]) [facility] Cirigliano, DO; Matthews, DO [address] [place], [place] [address] [phone] Mon-Fri 7:00-5:00 Babies seen by [facility] providers Does NOT accept Medicaid [facility] Briscoe, MD; Howley, PA; Moreira, PA [address], [place], [place] [address] [phone] Mon-Fri 8:00-5:00 Babies seen by [facility] providers Accepting Medicaid [facility] Boyd, MD; Church, PA; Jones, NP; Osborn, PA [address], , [place] [address] [phone] Mon-Fri 8:00-5:00 Babies seen by providers at [facility] Accepting Medicaid  [place] ([address]) [facility] [place] Wendling, DO [address] [place], [place] [address] [phone] Mon-Fri 8:00-5:00 Babies seen by [facility] providers Does NOT accept Medicaid Limited availability, please call early in hospitalization to schedule follow-up [facility] Calderon, PA; Cummings, MD; Dillard, MD; Martin, PA; Olson, MD; VanDeven, PA [address], [place], [place] [address] [phone] Mon-Fri 8:30-5:00, Sat 9:00-12:00 Babies seen by providers at [facility] Accepting Medicaid Please register online then schedule online or call office [email or web address]) Hunter, NP; Kumar, MD; Martin Rogers, PA [address], [place], [place] [address] [phone] Mon-Fri 8:00-5:00 Babies seen by providers at [facility] Accepting Medicaid [facility] ([facility]) [place], MD; Kristi Fleenor, NP; West, MD [address], [place], [place] [address] [phone] Mon-Fri 8:00-5:30, Sat&Sun by appointment (phones open at  8:30) Babies seen by [facility] providers Accepting Medicaid Must be a first-time baby or sibling of current patient [facility] [place]  [address], [place], [place]  [address] [phone]   Fax - [phone]  [place] ([address]) [facility] Brown, PA; Cowen, PA; Rice, MD; Helton, PA; Spry, MD [address] [place], [place] [address] [phone] Mon-Thur 8:00-7:00, Fri 8:00-5:00, Sat 8:00-12:00, Sun 9:00-12:00 Babies seen by [facility] providers Accepting Medicaid [facility] Coe-Goins, MD; Marshall, MD; Pierre-Louis, MD [address], [place], [place] [address] [phone] Mon-Thur 8:00-5:00 Babies seen by providers at [facility] Accepting Medicaid [facility] Bratton, MD; Coe-Goins, MD; Hayes, MD; Lewis, MD; List, MD; Lott, MD; Marshall, MD; Moran, MD; O'Neal, MD; Pierre-Louis, MD; Pitonzo, MD; Scholer, MD; Spangle, MD [address] [place], [place] [address] [phone] Mon-Fri 8:00-5:30, Sat (Oct.-Mar.) 9:00-1:00 Babies seen by providers at [facility] Accepting Medicaid Must fill out new patient packet, available online at [email or web address] [facility] ([facility]) Friddle, NP; Harris, NP; Kelly, NP; Logan, MD; Melvin, PA; Poth, MD; Ramadoss, MD; Stanton, NP [address], [place], [place] [address] [phone] Mon-Thur 8:00-5:30, Fri 8:00-5:00 Babies seen by providers at [facility] Accepting Medicaid  [place] ([address]) [facility] Dixon, PA; Sutcliffe, MD; Pickard, MD; Tapia, PA [address], [place], [place] [address] [phone] Mon-Fri 8:00-5:00 Babies seen by providers at [facility] Accepting Medicaid   [place] ([address]) [facility] [place] Masneri, DO; Meyers, MD; Nelson, PA [address], [place], [place] [address] [phone] Mon-Fri 8:00-5:00 Babies seen by providers at [facility] Does NOT accept Medicaid Limited appointment availability, please call early in hospitalization  [facility] [place] Kunedd, DO; McGowen, MD [address], [place], [place] [address] [phone] Mon-Fri 8:00-5:00 Babies seen by [facility] providers Does NOT accept Medicaid [facility] [place] Cameron, MD; MacDonald, MD; Michaels, PA; Nayak, MD [address] [place] [address], [place], [place] [address] [phone] Mon-Fri 8:00-5:00 After hours clinic ([address] [place], [place] [address]) [phone] Mon-Fri 5:00-8:00, Sat 12:00-6:00, Sun 10:00-4:00 Babies seen by [facility] providers Accepting Medicaid [facility] [place] [address] [place], [place]  [address] [phone]   Fax - [phone]  [place] [phone]) [place] [facility], [address] [place], [place], [place] [address] [phone] Mon-Fri 8:00-5:00 Babies seen by [facility] providers Does NOT accept Medicaid [place] [facility] [place] Daron Offer, [place] [address], [place], [place] [address] [phone] Mon-Thur 8:00-7:00, Fri 8:00-5:00, Sat 8:00-12:00 Babies seen by providers at [facility] Accepting Medicaid - but does not have vaccinations in office (must be received elsewhere) Limited availability, please call early in hospitalization  [place] [phone]) [place], MD [address] [place] [address] [phone]  Fax [phone]  [facility]  Lauretta Chester, MD, [place], [place], [place], [place], [place], [place]  [address] [facility]  [place], [place], [place] [address] [place], [place], [place] [address] [phone] ([place])  [facility] [address] [place], [place] [address] [place], [place], [place] [address] [phone] [facility] [address], [place], [place], [place] [address] [phone] [facility] [address] [place], [place] [address] [place], [place], [place] [address] [facility] [address], [place], [place] [address] [phone] [place], [place], [place] [address] [phone] Dr. Okey Regal. [place] [address] [place], [place] [address] [facility] [address], [place], [place] [address] [facility] [address], [place],  [address] [phone] TDaP Vaccine Pregnancy Get the Whooping Cough Vaccine While You Are Pregnant (CDC)  It is important for women to get the whooping cough vaccine in the third trimester of each pregnancy. Vaccines are the best way to prevent this disease. There are 2 different whooping cough vaccines. Both vaccines combine protection against whooping cough, tetanus and diphtheria, but they are for different age groups: Tdap: for everyone 11 years or older, including pregnant women  DTaP: for children 2 months through 21 years of age  You need the whooping cough vaccine during each of your pregnancies The recommended time to get the shot is during your 27th through 36th week of pregnancy, preferably during the earlier part of this time period. The Centers for Disease Control and Prevention (CDC) recommends that pregnant women receive the whooping cough vaccine for adolescents and adults (called Tdap vaccine) during the third  trimester of each pregnancy. The recommended time to get the shot is during your 27th through 36th week of pregnancy, preferably during the earlier part of this time period. This replaces the original recommendation that pregnant women get the vaccine only if they had not previously received it. The [employer] of Obstetricians and Gynecologists and the [employer] support this recommendation.  You should get the whooping cough vaccine while pregnant to pass protection to your baby frame support disabled and/or not supported in this browser  Learn why Deborah Pierce decided to get the whooping cough vaccine in her 3rd trimester of pregnancy and how her baby girl was born with some protection against the disease. Also available on YouTube. After receiving the whooping cough vaccine, your body will create protective antibodies (proteins produced by the body to fight off diseases) and pass some of them to your baby before birth. These antibodies provide your baby some short-term protection against whooping  cough in early life. These antibodies can also protect your baby from some of the more serious complications that come along with whooping cough. Your protective antibodies are at their highest about 2 weeks after getting the vaccine, but it takes time to pass them to your baby. So the preferred time to get the whooping cough vaccine is early in your third trimester. The amount of whooping cough antibodies in your body decreases over time. That is why CDC recommends you get a whooping cough vaccine during each pregnancy. Doing so allows each of your babies to get the greatest number of protective antibodies from you. This means each of your babies will get the best protection possible against this disease.  Getting the whooping cough vaccine while pregnant is better than getting the vaccine after you give birth Whooping cough vaccination during pregnancy is ideal so your baby will have  short-term protection as soon as he is born. This early protection is important because your baby will not start getting his whooping cough vaccines until he is 2 months old. These first few months of life are when your baby is at greatest risk for catching whooping cough. This is also when he's at greatest risk for having severe, potentially life-threating complications from the infection. To avoid that gap in protection, it is best to get a whooping cough vaccine during pregnancy. You will then pass protection to your baby before he is born. To continue protecting your baby, he should get whooping cough vaccines starting at 2 months old. You may never have gotten the Tdap vaccine before and did not get it during this pregnancy. If so, you should make sure to get the vaccine immediately after you give birth, before leaving the hospital or birthing center. It will take about 2 weeks before your body develops protection (antibodies) in response to the vaccine. Once you have protection from the vaccine, you are less likely to give whooping cough to your newborn while caring for him. But remember, your baby will still be at risk for catching whooping cough from others. A recent study looked to see how effective Tdap was at preventing whooping cough in babies whose mothers got the vaccine while pregnant or in the hospital after giving birth. The study found that getting Tdap between 27 through 36 weeks of pregnancy is 85% more effective at preventing whooping cough in babies younger than 2 months old. Blood tests cannot tell if you need a whooping cough vaccine There are no blood tests that can tell you if you have enough antibodies in your body to protect yourself or your baby against whooping cough. Even if you have been sick with whooping cough in the past or previously received the vaccine, you still should get the vaccine during each pregnancy. Breastfeeding may pass some protective antibodies onto your  baby By breastfeeding, you may pass some antibodies you have made in response to the vaccine to your baby. When you get a whooping cough vaccine during your pregnancy, you will have antibodies in your breast milk that you can share with your baby as soon as your milk comes in. However, your baby will not get protective antibodies immediately if you wait to get the whooping cough vaccine until after delivering your baby. This is because it takes about 2 weeks for your body to create antibodies. Learn more about the health benefits of breastfeeding.

## 2022-03-19 NOTE — Progress Notes (Signed)
PRENATAL VISIT NOTE  Subjective:  Deborah Pierce is a 31 y.o. G2P0010 at 46w2dbeing seen today for ongoing prenatal care.  She is currently monitored for the following issues for this Pierce-risk pregnancy and has Controlled type 2 diabetes mellitus without complication, with long-term current use of insulin ([place]; Diabetic peripheral neuropathy (HJacksonwald; Class 1 obesity due to excess calories with serious comorbidity and body mass index (BMI) of 34.0 to 34.9 in adult; Depression with anxiety; Supervision of Pierce risk pregnancy, antepartum; Mixed hyperlipidemia; Vitamin D deficiency; Type 2 diabetes mellitus affecting pregnancy, antepartum; and Obesity affecting pregnancy in second trimester on their problem list.  Patient reports no complaints.  Contractions: Not present. Vag. Bleeding: None.  Movement: Present. Denies leaking of fluid.   The following portions of the patient's history were reviewed and updated as appropriate: allergies, current medications, past family history, past medical history, past social history, past surgical history and problem list.   Objective:   Vitals:   03/19/22 0904  BP: 122/75  Pulse: 88  Weight: 225 lb (102.1 kg)    Fetal Status: Fetal Heart Rate (bpm): 150   Movement: Present     General:  Alert, oriented and cooperative. Patient is in no acute distress.  Skin: Skin is warm and dry. No rash noted.   Cardiovascular: Normal heart rate noted  Respiratory: Normal respiratory effort, no problems with respiration noted  Abdomen: Soft, gravid, appropriate for gestational age.  Pain/Pressure: Absent     Pelvic: Cervical exam deferred        Extremities: Normal range of motion.  Edema: None  Mental Status: Normal mood and affect. Normal behavior. Normal judgment and thought content.   [place]MFM OB FOLLOW UP  Result Date: 03/17/2022 ----------------------------------------------------------------------  OBSTETRICS REPORT                       (Signed Final  03/17/2022 08:44 am) ---------------------------------------------------------------------- Patient Info  ID #:       [id number]                         D.O.B.:  120-May-1992(30 yrs)  Name:       CVictoriano Pierce                  Visit Date: 03/17/2022 07:48 am ---------------------------------------------------------------------- Performed By  Attending:        VJohnell ComingsMD         Ref. Address:     [address] Performed By:     BGermain Pierce           [facility]  Referred By:      [facility] ---------------------------------------------------------------------- Orders  #  Description                           Code        Ordered By  1  [place] MFM OB FOLLOW UP                   [phone]    Deborah Pierce ----------------------------------------------------------------------  #  Order #                     Accession #                Episode #  1  [id number]                   [id number]                 [id number] ---------------------------------------------------------------------- Indications  Pre-existing diabetes, type 2, in pregnancy,   O24.112  second trimester (insulin)  Obesity complicating pregnancy, second         O99.212  trimester (34)  [redacted] weeks gestation of pregnancy                Z3A.24  LR NIPS/Neg Horizon/Neg AFP ---------------------------------------------------------------------- Fetal Evaluation  Num Of Fetuses:         1  Fetal Heart Rate(bpm):  146  Cardiac Activity:       Observed  Presentation:           Cephalic  Placenta:               Posterior  P. Cord Insertion:      Visualized, central  Amniotic Fluid  AFI FV:      Within normal limits                              Largest Pocket(cm)                               5.7 ---------------------------------------------------------------------- Biometry  BPD:      58.1  mm     G. Age:  23w 6d         36  %    CI:        73.16   %    70 - 86                                                          FL/HC:      19.2   %    18.7 - 20.9  HC:      215.9  mm     G. Age:  23w 4d         20  %  HC/AC:      1.06        1.05 - 1.21  AC:      203.8  mm     G. Age:  25w 0d         72  %    FL/BPD:     71.3   %    71 - 87  FL:       41.4  mm     G. Age:  23w 3d         21  %    FL/AC:      20.3   %    20 - 24  HUM:      38.5  mm     G. Age:  23w 5d         33  %  Est. FW:     674  gm      1 lb 8 oz     52  % ---------------------------------------------------------------------- OB History  Gravidity:    2         Term:   0        Prem:   0        SAB:   1  TOP:          0       Ectopic:  0        Living: 0 ---------------------------------------------------------------------- Gestational Age  LMP:           25w 1d        Date:  09/22/21                  EDD:   06/29/22  U/S Today:     24w 0d                                        EDD:   07/07/22  Best:          24w 0d     Det. By:  U/S C R L  (11/25/21)    EDD:   07/07/22 ---------------------------------------------------------------------- Anatomy  Cranium:               Appears normal         LVOT:                   Appears normal  Cavum:                 Appears normal         Aortic Arch:            Previously seen  Ventricles:            Previously seen        Ductal Arch:            Appears normal  Choroid Plexus:        Previously seen        Diaphragm:              Appears normal  Cerebellum:            Previously seen        Stomach:                Appears normal, left  sided  Posterior Fossa:       Previously seen        Abdomen:                Appears normal  Nuchal Fold:           Previously seen        Abdominal Wall:         Previously seen   Face:                  Profile nl; orbits     Cord Vessels:           Previously seen                         prev visualized  Lips:                  Previously seen        Kidneys:                Appear normal  Palate:                Not well visualized    Bladder:                Appears normal  Thoracic:              Appears normal         Spine:                  Limited views                                                                        appear normal  Heart:                 Appears normal         Upper Extremities:      Prev Visualized                         (4CH, axis, and                         situs)  RVOT:                  Previously seen        Lower Extremities:      Prev Visualized  Other:  Fetus appears to be a female. Lenses prev visualized. Feet visualized.          Hands visualized. Rt 5th vis. 3VV prev visualized. Technically difficult          due to maternal habitus and fetal position. ---------------------------------------------------------------------- Cervix Uterus Adnexa  Cervix  Length:            3.5  cm.  Normal appearance by transabdominal scan.  Uterus  No abnormality visualized.  Right Ovary  Size(cm)     1.76   x   2.02   x  1.35      Vol(ml): 2.51  Within normal limits.  Left Ovary  Size(cm)     2.52   x   1.87  x  1.56      Vol(ml): 3.85  Within normal limits.  Cul De Sac  No free fluid seen.  Adnexa  No adnexal mass visualized. ---------------------------------------------------------------------- Comments  This patient was seen for a follow up growth scan due to  pregestational diabetes that is treated with an insulin pump.  She denies any problems since her last exam.  She had a normal fetal echocardiogram performed with [facility]  pediatric cardiology.  She was informed that the fetal growth and amniotic fluid  level appears appropriate for her gestational age.  Due to pregestational diabetes, a follow-up growth scan was  scheduled in 4 weeks.  ----------------------------------------------------------------------                   Deborah Comings, MD Electronically Signed Final Report   03/17/2022 08:44 am ----------------------------------------------------------------------   Assessment and Plan:  Pregnancy: G2P0010 at 27w2d1. Type 2 diabetes mellitus affecting pregnancy, antepartum Followed by Endocrinology. On Omnipod, Tresiba, Metformin. Had normal fetal ECHO. Continue antenatal testing and ultrasounds as per MFM and follow MFM recommendations.    2. [redacted] weeks gestation of pregnancy 3. Supervision of Pierce risk pregnancy, antepartum No other concerns. Third trimester labs next visit. Preterm labor symptoms and general obstetric precautions including but not limited to vaginal bleeding, contractions, leaking of fluid and fetal movement were reviewed in detail with the patient. Please refer to After Visit Summary for other counseling recommendations.   Return in about 4 weeks (around 04/16/2022) for 3rd trimester labs (no GTT,  already has DM), TDap, OFFICE OB VISIT (MD only).  Future Appointments  Date Time Provider [place] 04/16/2022  8:35 AM PAletha Halim MD [facility]  04/17/2022 10:15 AM [facility]URSE [facility] 04/17/2022 10:30 AM [facility] 04/30/2022  9:35 AM PAletha Halim MD [facility]  08/25/2022 10:30 AM KRalene Bathe MD [facility] None  09/01/2022 10:30 AM KRalene Bathe MD [facility] None    UVerita Schneiders MD

## 2022-04-16 ENCOUNTER — Encounter: Payer: Self-pay | Admitting: Obstetrics and Gynecology

## 2022-04-16 ENCOUNTER — Ambulatory Visit (INDEPENDENT_AMBULATORY_CARE_PROVIDER_SITE_OTHER): Payer: No Typology Code available for payment source | Admitting: Obstetrics and Gynecology

## 2022-04-16 VITALS — BP 127/78 | HR 82 | Wt 232.0 lb

## 2022-04-16 DIAGNOSIS — Z23 Encounter for immunization: Secondary | ICD-10-CM

## 2022-04-16 DIAGNOSIS — O0993 Supervision of high risk pregnancy, unspecified, third trimester: Secondary | ICD-10-CM

## 2022-04-16 DIAGNOSIS — Z3A28 28 weeks gestation of pregnancy: Secondary | ICD-10-CM

## 2022-04-16 DIAGNOSIS — Z794 Long term (current) use of insulin: Secondary | ICD-10-CM

## 2022-04-16 DIAGNOSIS — O24119 Pre-existing diabetes mellitus, type 2, in pregnancy, unspecified trimester: Secondary | ICD-10-CM

## 2022-04-16 DIAGNOSIS — O0990 Supervision of high risk pregnancy, unspecified, unspecified trimester: Secondary | ICD-10-CM

## 2022-04-16 DIAGNOSIS — D239 Other benign neoplasm of skin, unspecified: Secondary | ICD-10-CM | POA: Insufficient documentation

## 2022-04-16 DIAGNOSIS — E119 Type 2 diabetes mellitus without complications: Secondary | ICD-10-CM

## 2022-04-16 DIAGNOSIS — Z6837 Body mass index (BMI) 37.0-37.9, adult: Secondary | ICD-10-CM | POA: Insufficient documentation

## 2022-04-16 DIAGNOSIS — E559 Vitamin D deficiency, unspecified: Secondary | ICD-10-CM

## 2022-04-16 LAB — OB RESULTS CONSOLE HIV ANTIBODY (ROUTINE TESTING): HIV: NONREACTIVE

## 2022-04-16 LAB — OB RESULTS CONSOLE RPR: RPR: NONREACTIVE

## 2022-04-16 NOTE — Addendum Note (Signed)
Addended by: Crosby Oyster on: 04/16/2022 09:12 AM   Modules accepted: Orders

## 2022-04-16 NOTE — Progress Notes (Signed)
ROB [redacted]w[redacted]d Pt will receive T-Dap  CC: None

## 2022-04-16 NOTE — Progress Notes (Signed)
   PRENATAL VISIT NOTE  Subjective:  Deborah Pierce is a 32 y.o. G2P0010 at 51w2dbeing seen today for ongoing prenatal care.  She is currently monitored for the following issues for this high-risk pregnancy and has Controlled type 2 diabetes mellitus without complication, with long-term current use of insulin (HS[place]; Diabetic peripheral neuropathy (HClio; Class 1 obesity due to excess calories with serious comorbidity and body mass index (BMI) of 34.0 to 34.9 in adult; Depression with anxiety; Supervision of high risk pregnancy, antepartum; Mixed hyperlipidemia; Vitamin D deficiency; Type 2 diabetes mellitus affecting pregnancy, antepartum; Obesity in pregnancy, antepartum, third trimester; Dysplastic nevus; and BMI 37.0-37.9, adult on their problem list.  Patient reports no complaints.  Contractions: Not present. Vag. Bleeding: None.  Movement: Present. Denies leaking of fluid.   The following portions of the patient's history were reviewed and updated as appropriate: allergies, current medications, past family history, past medical history, past social history, past surgical history and problem list.   Objective:   Vitals:   04/16/22 0850  BP: 127/78  Pulse: 82  Weight: 232 lb (105.2 kg)    Fetal Status: Fetal Heart Rate (bpm): 140   Movement: Present     General:  Alert, oriented and cooperative. Patient is in no acute distress.  Skin: Skin is warm and dry. No rash noted.   Cardiovascular: Normal heart rate noted  Respiratory: Normal respiratory effort, no problems with respiration noted  Abdomen: Soft, gravid, appropriate for gestational age.  Pain/Pressure: Absent     Pelvic: Cervical exam deferred        Extremities: Normal range of motion.  Edema: Trace  Mental Status: Normal mood and affect. Normal behavior. Normal judgment and thought content.   Assessment and Plan:  Pregnancy: G2P0010 at 257w2d. [redacted] weeks gestation of pregnancy Labs today. Continue asa - RPR - HIV antibody  (with reflex) - CBC - Hemoglobin A1c  2. Controlled type 2 diabetes mellitus without complication, with long-term current use of insulin (HCC) Followed by Deborah Pierce endo, last seen yesterday. Currently on dexcom, omnipod (total approx 90u/day, which includes 15u tidac) & tresiba 48u qhs. AM fastings 80s-110s and 2h pp can be in the 200s so insulin was increased yesterday. Goal numbers d/w pt and will f/u in a week to see how her sugars are. Possibility of inpatient admission for sugar control d/w pt in the future. Also d/w her re: likely 37wk delivery F/u mfm growth tomorrow. Start ap testing at 32wks. Fetal echo wnl 11/30  - Hemoglobin A1c  3. Supervision of high risk pregnancy, antepartum  4. Type 2 diabetes mellitus affecting pregnancy, antepartum  5. BMI 37.0-37.9, adult  6. [facility] Goals d/w pt  7. Dysplastic nevus Needs derm f/u pp  Preterm labor symptoms and general obstetric precautions including but not limited to vaginal bleeding, contractions, leaking of fluid and fetal movement were reviewed in detail with the patient. Please refer to After Visit Summary for other counseling recommendations.   Return in about 1 week (around 04/23/2022) for virtual visit, md visit.  Future Appointments  Date Time Provider De[place]1/08/2022 10:15 AM [facility]1/08/2022 10:30 AM [facility] US2 [facility]/18/2024  9:35 AM PiAletha HalimMD [facility]  08/25/2022 10:30 AM KoRalene BatheMD [facility] None  09/01/2022 10:30 AM KoRalene BatheMD [facility] None    ChAletha HalimMD

## 2022-04-17 ENCOUNTER — Other Ambulatory Visit: Payer: Self-pay | Admitting: *Deleted

## 2022-04-17 ENCOUNTER — Ambulatory Visit: Payer: No Typology Code available for payment source | Attending: Obstetrics

## 2022-04-17 ENCOUNTER — Ambulatory Visit: Payer: No Typology Code available for payment source | Admitting: *Deleted

## 2022-04-17 VITALS — BP 138/64 | HR 93

## 2022-04-17 DIAGNOSIS — O24119 Pre-existing diabetes mellitus, type 2, in pregnancy, unspecified trimester: Secondary | ICD-10-CM | POA: Insufficient documentation

## 2022-04-17 DIAGNOSIS — Z3A28 28 weeks gestation of pregnancy: Secondary | ICD-10-CM

## 2022-04-17 DIAGNOSIS — O99213 Obesity complicating pregnancy, third trimester: Secondary | ICD-10-CM

## 2022-04-17 DIAGNOSIS — O24112 Pre-existing diabetes mellitus, type 2, in pregnancy, second trimester: Secondary | ICD-10-CM | POA: Insufficient documentation

## 2022-04-17 DIAGNOSIS — E1165 Type 2 diabetes mellitus with hyperglycemia: Secondary | ICD-10-CM | POA: Diagnosis not present

## 2022-04-17 DIAGNOSIS — E669 Obesity, unspecified: Secondary | ICD-10-CM

## 2022-04-17 DIAGNOSIS — O99212 Obesity complicating pregnancy, second trimester: Secondary | ICD-10-CM | POA: Diagnosis not present

## 2022-04-17 DIAGNOSIS — O24013 Pre-existing diabetes mellitus, type 1, in pregnancy, third trimester: Secondary | ICD-10-CM

## 2022-04-17 DIAGNOSIS — Z7984 Long term (current) use of oral hypoglycemic drugs: Secondary | ICD-10-CM

## 2022-04-17 DIAGNOSIS — O0990 Supervision of high risk pregnancy, unspecified, unspecified trimester: Secondary | ICD-10-CM | POA: Diagnosis present

## 2022-04-17 DIAGNOSIS — Z794 Long term (current) use of insulin: Secondary | ICD-10-CM

## 2022-04-17 DIAGNOSIS — O24113 Pre-existing diabetes mellitus, type 2, in pregnancy, third trimester: Secondary | ICD-10-CM | POA: Diagnosis not present

## 2022-04-17 LAB — CBC
Hematocrit: 35.3 % (ref 34.0–46.6)
Hemoglobin: 11.2 g/dL (ref 11.1–15.9)
MCH: 27.7 pg (ref 26.6–33.0)
MCHC: 31.7 g/dL (ref 31.5–35.7)
MCV: 87 fL (ref 79–97)
Platelets: 457 10*3/uL — ABNORMAL HIGH (ref 150–450)
RBC: 4.05 x10E6/uL (ref 3.77–5.28)
RDW: 13.1 % (ref 11.7–15.4)
WBC: 12.3 10*3/uL — ABNORMAL HIGH (ref 3.4–10.8)

## 2022-04-17 LAB — HEMOGLOBIN A1C
Est. average glucose Bld gHb Est-mCnc: 126 mg/dL
Hgb A1c MFr Bld: 6 % — ABNORMAL HIGH (ref 4.8–5.6)

## 2022-04-17 LAB — RPR: RPR Ser Ql: NONREACTIVE

## 2022-04-17 LAB — HIV ANTIBODY (ROUTINE TESTING W REFLEX): HIV Screen 4th Generation wRfx: NONREACTIVE

## 2022-04-17 NOTE — Progress Notes (Unsigned)
Us/

## 2022-04-22 ENCOUNTER — Telehealth: Payer: No Typology Code available for payment source | Admitting: Family Medicine

## 2022-04-22 ENCOUNTER — Telehealth: Payer: Self-pay | Admitting: Family Medicine

## 2022-04-22 ENCOUNTER — Encounter: Payer: Self-pay | Admitting: Family Medicine

## 2022-04-22 DIAGNOSIS — Z9641 Presence of insulin pump (external) (internal): Secondary | ICD-10-CM

## 2022-04-22 DIAGNOSIS — O24113 Pre-existing diabetes mellitus, type 2, in pregnancy, third trimester: Secondary | ICD-10-CM

## 2022-04-22 DIAGNOSIS — O24119 Pre-existing diabetes mellitus, type 2, in pregnancy, unspecified trimester: Secondary | ICD-10-CM

## 2022-04-22 DIAGNOSIS — O0990 Supervision of high risk pregnancy, unspecified, unspecified trimester: Secondary | ICD-10-CM

## 2022-04-22 DIAGNOSIS — E119 Type 2 diabetes mellitus without complications: Secondary | ICD-10-CM

## 2022-04-22 DIAGNOSIS — E6609 Other obesity due to excess calories: Secondary | ICD-10-CM

## 2022-04-22 DIAGNOSIS — Z7984 Long term (current) use of oral hypoglycemic drugs: Secondary | ICD-10-CM

## 2022-04-22 DIAGNOSIS — Z3A29 29 weeks gestation of pregnancy: Secondary | ICD-10-CM

## 2022-04-22 DIAGNOSIS — Z794 Long term (current) use of insulin: Secondary | ICD-10-CM

## 2022-04-22 NOTE — Telephone Encounter (Signed)
Called patient to set up education appointment, there was no answer to the phone call so a voicemail was left with the call back number for the office.

## 2022-04-22 NOTE — Progress Notes (Signed)
Will discuss blood sugars highest was 233 around 9:30pm last night.

## 2022-04-22 NOTE — Progress Notes (Signed)
TELEHEALTH OBSTETRICS VISIT ENCOUNTER NOTE  Provider location: [facility] [place] [facility]   Patient location: Home  I connected with Deborah Pierce on 04/22/22 at  1:30 PM EST by telephone at home and verified that I am speaking with the correct person using two identifiers. Of note, unable to do video encounter due to technical difficulties.    I discussed the limitations, risks, security and privacy concerns of performing an evaluation and management service by telephone and the availability of in person appointments. I also discussed with the patient that there may be a patient responsible charge related to this service. The patient expressed understanding and agreed to proceed.  Subjective:  Deborah Pierce is a 32 y.o. G2P0010 at 21w1dbeing followed for ongoing prenatal care.  She is currently monitored for the following issues for this high-risk pregnancy and has Controlled type 2 diabetes mellitus without complication, with long-term current use of insulin (HM[place]; Diabetic peripheral neuropathy (HMine [place]; Class 1 obesity due to excess calories with serious comorbidity and body mass index (BMI) of 34.0 to 34.9 in adult; Depression with anxiety; Supervision of high risk pregnancy, antepartum; Mixed hyperlipidemia; Vitamin D deficiency; Type 2 diabetes mellitus affecting pregnancy, antepartum; Obesity in pregnancy, antepartum, third trimester; Dysplastic nevus; and BMI 37.0-37.9, adult on their problem list.  Patient reports no complaints. Reports fetal movement. Denies any contractions, bleeding or leaking of fluid.   The following portions of the patient's history were reviewed and updated as appropriate: allergies, current medications, past family history, past medical history, past social history, past surgical history and problem list.   Objective:  Last menstrual period 09/22/2021. General:  Alert, oriented and cooperative.   Mental Status: Normal mood and affect  perceived. Normal judgment and thought content.  Rest of physical exam deferred due to type of encounter  Assessment and Plan:  Pregnancy: G2P0010 at 252w1d. Supervision of high risk pregnancy, antepartum   2. Type 2 diabetes mellitus affecting pregnancy, antepartum On Metformin and Omnipod. Basal at 1.75, boluses for meals at 17u. Am today was 45 but pp after dinner last pm was 230. Pod lasts 2.5 days. She is on Metformin. Last growth at 79%. To meet with Angela--may need to increase basal through the day and back off on overnight so she does not bottom out. May also, need to increase her meal coverage and pull some out of the pod if cannot hold enough.  3. Class 1 obesity due to excess calories with serious comorbidity and body mass index (BMI) of 34.0 to 34.9 in adult   Preterm labor symptoms and general obstetric precautions including but not limited to vaginal bleeding, contractions, leaking of fluid and fetal movement were reviewed in detail with the patient.  I discussed the assessment and treatment plan with the patient. The patient was provided an opportunity to ask questions and all were answered. The patient agreed with the plan and demonstrated an understanding of the instructions. The patient was advised to call back or seek an in-person office evaluation/[facility] any urgent or concerning symptoms. Please refer to After Visit Summary for other counseling recommendations.   I provided 13 minutes of non-face-to-face time during this encounter.  No follow-ups on file.  Future Appointments  Date Time Provider DeRichmond West1/18/2024  9:35 AM PiAletha HalimMD [facility]  05/06/2022  1:30 PM PrDonnamae JudeMD [facility]  05/15/2022  3:30 PM [facility]URSE [facility]  05/15/2022  3:45 PM [facility] US1 [facility]  05/22/2022  3:30 PM [facility] NURSE [facility]  05/22/2022  3:45 PM [facility] US1 [facility]  05/29/2022   2:30 PM [facility] NURSE [facility]  05/29/2022  2:45 PM [facility] US4 [facility]  06/05/2022  3:15 PM [facility] NURSE [facility]  06/05/2022  3:30 PM [facility] US3 [facility]  06/12/2022  3:30 PM [facility] NURSE [facility]  06/12/2022  3:45 PM [facility] US5 W[facility]  08/25/2022 10:30 AM Ralene Bathe, MD [facility] None  09/01/2022 10:30 AM Ralene Bathe, MD [facility] None    Donnamae Jude, [place] for [facility] [place]

## 2022-04-23 ENCOUNTER — Other Ambulatory Visit: Payer: Self-pay

## 2022-04-23 DIAGNOSIS — O24119 Pre-existing diabetes mellitus, type 2, in pregnancy, unspecified trimester: Secondary | ICD-10-CM

## 2022-04-28 ENCOUNTER — Encounter: Payer: Self-pay | Admitting: Obstetrics and Gynecology

## 2022-04-30 ENCOUNTER — Encounter: Payer: Self-pay | Admitting: Obstetrics and Gynecology

## 2022-04-30 ENCOUNTER — Ambulatory Visit (INDEPENDENT_AMBULATORY_CARE_PROVIDER_SITE_OTHER): Payer: No Typology Code available for payment source | Admitting: Registered"

## 2022-04-30 ENCOUNTER — Ambulatory Visit (INDEPENDENT_AMBULATORY_CARE_PROVIDER_SITE_OTHER): Payer: No Typology Code available for payment source | Admitting: Obstetrics and Gynecology

## 2022-04-30 ENCOUNTER — Encounter: Payer: No Typology Code available for payment source | Attending: Family Medicine | Admitting: Registered"

## 2022-04-30 VITALS — BP 136/81 | HR 98 | Wt 236.0 lb

## 2022-04-30 DIAGNOSIS — Z6837 Body mass index (BMI) 37.0-37.9, adult: Secondary | ICD-10-CM

## 2022-04-30 DIAGNOSIS — E1165 Type 2 diabetes mellitus with hyperglycemia: Secondary | ICD-10-CM | POA: Diagnosis not present

## 2022-04-30 DIAGNOSIS — E559 Vitamin D deficiency, unspecified: Secondary | ICD-10-CM | POA: Insufficient documentation

## 2022-04-30 DIAGNOSIS — N911 Secondary amenorrhea: Secondary | ICD-10-CM | POA: Diagnosis not present

## 2022-04-30 DIAGNOSIS — E1140 Type 2 diabetes mellitus with diabetic neuropathy, unspecified: Secondary | ICD-10-CM | POA: Insufficient documentation

## 2022-04-30 DIAGNOSIS — Z6830 Body mass index (BMI) 30.0-30.9, adult: Secondary | ICD-10-CM | POA: Diagnosis not present

## 2022-04-30 DIAGNOSIS — E669 Obesity, unspecified: Secondary | ICD-10-CM | POA: Diagnosis not present

## 2022-04-30 DIAGNOSIS — O0993 Supervision of high risk pregnancy, unspecified, third trimester: Secondary | ICD-10-CM

## 2022-04-30 DIAGNOSIS — Z713 Dietary counseling and surveillance: Secondary | ICD-10-CM | POA: Diagnosis present

## 2022-04-30 DIAGNOSIS — E782 Mixed hyperlipidemia: Secondary | ICD-10-CM | POA: Insufficient documentation

## 2022-04-30 DIAGNOSIS — Z3A30 30 weeks gestation of pregnancy: Secondary | ICD-10-CM

## 2022-04-30 DIAGNOSIS — O24119 Pre-existing diabetes mellitus, type 2, in pregnancy, unspecified trimester: Secondary | ICD-10-CM

## 2022-04-30 DIAGNOSIS — O0990 Supervision of high risk pregnancy, unspecified, unspecified trimester: Secondary | ICD-10-CM

## 2022-04-30 DIAGNOSIS — O24113 Pre-existing diabetes mellitus, type 2, in pregnancy, third trimester: Secondary | ICD-10-CM

## 2022-04-30 DIAGNOSIS — O99213 Obesity complicating pregnancy, third trimester: Secondary | ICD-10-CM

## 2022-04-30 NOTE — Progress Notes (Signed)
ROB   CC: None    

## 2022-04-30 NOTE — Progress Notes (Signed)
Diabetes Self-Management Education  Visit Type: First/Initial  Appt. Start Time: 1540 Appt. End Time: 1612  05/01/2022  [name], identified by name and date of birth, is a 32 y.o. female with a diagnosis of Diabetes: Type 2 (pregnant - estimated due date 07-07-22).   ASSESSMENT Estimated due date: 07/07/22; [redacted]w[redacted]d Clinical: Medications: metformin, novolog (for Omnipod), TTyler AasMedical History: T2 dx at age [age]Labs:  Lab Results  Component Value Date   HGBA1C 6.0 (H) 04/16/2022    Omnipod settings using manual mode to be able to bring blood sugar below 110 mg/dL Basal settings 44.1 units Max basal (not obtained) Basal rates  12a - 4 am 1.35 u/hr 4am - 10 pm 2.0 u/hr 10p - 12 am   1.35 u/hr Temp basal  not set Bolus settings Target Blood glucose  12a-8p 110 mg/dL & 8p-12a: 120 mg/dL Insulin to carb (IC) ratio   7.5 Correction factor 30 mg/dL/unit for BG above (need to obtain) mg/dL; correct above 120 mg/dL 12a-8p & 140 mg/dL 8p-12a Minimum blood glucose for bolus calculations 70 mg/dL Duration of insulin action 2 hrs  Summary for the last week:    Dietary and Lifestyle History: Pt reports has tried a few different injectable diabetes medications and has h/o injection site reactions. Pt reports reactions may be due to preservatives in the insulin formulary. Pt denies adverse reactions to Novolog used in Omnipod.  Pt reports h/o diabetes burnout and was not managing diabetes well for a while. Pt states when she started having the desire to conceive she became motivated to get DM controlled. Pt states she is doing well with Omnipod insulin pump and understand the nature of changing insulin needs in pregnancy. Pt states her goal is to have a game plan managing DM during pregnancy to ensure best outcomes.  Pt states [facility] pregnancy [facility], but now will be managed by ob healthcare team. Pt states due to her long  term use of insulin, she makes adjustments to her insulin doses and has just been keeping her MD notified.  Physical Activity: not assessed Stress: not assessed Sleep: 9 pm - 7:30 am   Diabetes Self-Management Education - 05/01/22 1609       Visit Information   Visit Type First/Initial      Initial Visit   Diabetes Type Type 2   pregnant - estimated due date 07-07-22   Date Diagnosed 2010    Are you taking your medications as prescribed? Yes      Psychosocial Assessment   Patient Concerns Glycemic Control;Medication      Complications   Last HgB A1C per patient/outside source 6.7 %   Jan 22, 2022   How often do you check your blood sugar? > 4 times/day   CGM   Number of hyperglycemic episodes ( >'200mg'$ /dL): Rare      Patient Education   Medications Taught/reviewed insulin/injectables, injection, site rotation, insulin/injectables storage and needle disposal.;Reviewed patients medication for diabetes, action, purpose, timing of dose and side effects.;Reviewed medication adjustment guidelines for hyperglycemia and sick days.    Monitoring Taught/evaluated CGM (comment);Identified appropriate SMBG and/or A1C goals.      Individualized Goals (developed by patient)   Monitoring  Consistenly use CGM   Pt shared [place]andover HillsAssessment   Patient understands the diabetes disease and treatment process. Demonstrates understanding / competency    Patient understands using medications safely. Demonstrates understanding /  competency    Patient understands monitoring blood glucose, interpreting and using results Demonstrates understanding / competency      Outcomes   Expected Outcomes Demonstrated interest in learning. Expect positive outcomes    Future DMSE 2 wks    Program Status Not Completed             Individualized Plan for Diabetes Self-Management Training:   Learning Objective:  Patient will have a greater understanding of diabetes  self-management. Patient education plan is to attend individual and/or group sessions per assessed needs and concerns.    Expected Outcomes:  Demonstrated interest in learning. Expect positive outcomes  Education material provided: none  If problems or questions, patient to contact team via:  Phone and MyChart  Future DSME appointment: 2 wks

## 2022-04-30 NOTE — Progress Notes (Signed)
PRENATAL VISIT NOTE  Subjective:  Deborah Pierce is a 32 y.o. G2P0010 at 34w2dbeing seen today for ongoing prenatal care.  She is currently monitored for the following issues for this high-risk pregnancy and has Controlled type 2 diabetes mellitus without complication, with long-term current use of insulin (H[place]; Diabetic peripheral neuropathy (HBon Air; Class 1 obesity due to excess calories with serious comorbidity and body mass index (BMI) of 34.0 to 34.9 in adult; Depression with anxiety; Supervision of high risk pregnancy, antepartum; Mixed hyperlipidemia; Vitamin D deficiency; Type 2 diabetes mellitus affecting pregnancy, antepartum; Obesity in pregnancy, antepartum, third trimester; Dysplastic nevus; and BMI 37.0-37.9, adult on their problem list.  Patient reports no complaints.  Contractions: Not present. Vag. Bleeding: None.  Movement: Present. Denies leaking of fluid.   The following portions of the patient's history were reviewed and updated as appropriate: allergies, current medications, past family history, past medical history, past social history, past surgical history and problem list.   Objective:   Vitals:   04/30/22 0949  BP: 136/81  Pulse: 98  Weight: 236 lb (107 kg)    Fetal Status: Fetal Heart Rate (bpm): 162   Movement: Present     General:  Alert, oriented and cooperative. Patient is in no acute distress.  Skin: Skin is warm and dry. No rash noted.   Cardiovascular: Normal heart rate noted  Respiratory: Normal respiratory effort, no problems with respiration noted  Abdomen: Soft, gravid, appropriate for gestational age.  Pain/Pressure: Present     Pelvic: Cervical exam deferred        Extremities: Normal range of motion.  Edema: None  Mental Status: Normal mood and affect. Normal behavior. Normal judgment and thought content.   Assessment and Plan:  Pregnancy: G2P0010 at 396w2d. Supervision of high risk pregnancy, antepartum Normal 28wk labs last  visit Continue low dose aspirin  2. [redacted] weeks gestation of pregnancy  3. Pre-existing type 2 diabetes mellitus during pregnancy in third trimester On omnipod and doing SQ injections for meals; she also has a continuous CBG meter AM fastings in the 110s-120s and 2 hour post prandial in the 150s, which is better than last visit. F/u DM education visit today and patient is also followed by [facility] D/w her likely 37-38wk delivery but f/u mfm recs around 34-35wks 1/5: 79%, 1410gm, ac 97%, afi 15 Pt to start weekly testing at 32wks  4. Obesity in pregnancy, antepartum, third trimester  5. BMI 37.0-37.9, adult  Preterm labor symptoms and general obstetric precautions including but not limited to vaginal bleeding, contractions, leaking of fluid and fetal movement were reviewed in detail with the patient. Please refer to After Visit Summary for other counseling recommendations.   No follow-ups on file.  Future Appointments  Date Time Provider DeChena Ridge1/18/2024  3:15 PM [facility]1/24/2024  1:30 PM PrDonnamae JudeMD CWH-WSCA [facility]  05/15/2022  3:30 PM WMC-MFC NURSE [facility]/05/2022  3:45 PM WMC-MFC US1 WMC-MFCUS WM[facility]2/12/2022  3:30 PM WMC-MFC NURSE [facility]2/12/2022  3:45 PM WMC-MFC US1 [facility]MFCUS WM[facility]2/16/2024  2:30 PM WMC-MFC NURSE [facility] WML[facility]2/16/2024  2:45 PM WMC-MFC US4 WMC-MFCUS WML[facility]2/23/2024  3:15 PM WMC-MFC NURSE WMC-MFC WME[facility]2/23/2024  3:30 PM WMC-MFC US3 [facility]MFCUS [facility]MC[facility]3/04/2022  3:30 PM WMC-MFC NURSE WMC-MFC WMA[facility]3/04/2022  3:45 PM WMC-MFC US5 [facility]MC[facility]MFCUS WM[facility]5/14/2024 10:30 AM KoRalene BatheMD ASC-ASC None  09/01/2022  10:30 AM Ralene Bathe, MD ASC-ASC None    Aletha Halim, MD

## 2022-05-05 ENCOUNTER — Encounter: Payer: Self-pay | Admitting: Registered"

## 2022-05-06 ENCOUNTER — Encounter: Payer: Self-pay | Admitting: Family Medicine

## 2022-05-06 ENCOUNTER — Ambulatory Visit: Payer: No Typology Code available for payment source | Admitting: Family Medicine

## 2022-05-06 VITALS — BP 128/80 | HR 90 | Wt 238.0 lb

## 2022-05-06 DIAGNOSIS — O0990 Supervision of high risk pregnancy, unspecified, unspecified trimester: Secondary | ICD-10-CM

## 2022-05-06 DIAGNOSIS — O24119 Pre-existing diabetes mellitus, type 2, in pregnancy, unspecified trimester: Secondary | ICD-10-CM

## 2022-05-06 DIAGNOSIS — Z3A31 31 weeks gestation of pregnancy: Secondary | ICD-10-CM

## 2022-05-06 DIAGNOSIS — O24113 Pre-existing diabetes mellitus, type 2, in pregnancy, third trimester: Secondary | ICD-10-CM

## 2022-05-06 DIAGNOSIS — F418 Other specified anxiety disorders: Secondary | ICD-10-CM

## 2022-05-06 DIAGNOSIS — O0993 Supervision of high risk pregnancy, unspecified, third trimester: Secondary | ICD-10-CM

## 2022-05-06 MED ORDER — FLUOXETINE HCL 40 MG PO CAPS: 40.0000 mg | ORAL_CAPSULE | Freq: Every day | ORAL | 1 refills | Status: DC

## 2022-05-06 NOTE — Progress Notes (Signed)
PRENATAL VISIT NOTE  Subjective:  Deborah Pierce is a 32 y.o. G2P0010 at 6w1dbeing seen today for ongoing prenatal care.  She is currently monitored for the following issues for this high-risk pregnancy and has Controlled type 2 diabetes mellitus without complication, with long-term current use of insulin (HMat[place]; Diabetic peripheral neuropathy (HAl[place]; Class 1 obesity due to excess calories with serious comorbidity and body mass index (BMI) of 34.0 to 34.9 in adult; Depression with anxiety; Supervision of high risk pregnancy, antepartum; Mixed hyperlipidemia; Vitamin D deficiency; Type 2 diabetes mellitus affecting pregnancy, antepartum; Obesity in pregnancy, antepartum, third trimester; Dysplastic nevus; and BMI 37.0-37.9, adult on their problem list.  Patient reports no complaints.  Contractions: Not present. Vag. Bleeding: None.  Movement: Present. Denies leaking of fluid.   The following portions of the patient's history were reviewed and updated as appropriate: allergies, current medications, past family history, past medical history, past social history, past surgical history and problem list.   Objective:   Vitals:   05/06/22 1358  BP: 128/80  Pulse: 90  Weight: 238 lb (108 kg)    Fetal Status: Fetal Heart Rate (bpm): 152   Movement: Present     General:  Alert, oriented and cooperative. Patient is in no acute distress.  Skin: Skin is warm and dry. No rash noted.   Cardiovascular: Normal heart rate noted  Respiratory: Normal respiratory effort, no problems with respiration noted  Abdomen: Soft, gravid, appropriate for gestational age.  Pain/Pressure: Absent     Pelvic: Cervical exam deferred        Extremities: Normal range of motion.  Edema: Moderate pitting, indentation subsides rapidly  Mental Status: Normal mood and affect. Normal behavior. Normal judgment and thought content.   Assessment and Plan:  Pregnancy: G2P0010 at 328w1d. Type 2 diabetes mellitus affecting  pregnancy, antepartum She is on pump--she is getting low at times overnight--may need tweaks to basal--awoke at 40 last pm. Last growth at 79%, AC 97% Continue pump, metformin, Tresiba On ASA To begin antenatal testing with MFM starting @ 32 weeks  2. Supervision of high risk pregnancy, antepartum   3. Depression with anxiety On Prozac - FLUoxetine (PROZAC) 40 MG capsule; Take 1 capsule (40 mg total) by mouth daily.  Dispense: 90 capsule; Refill: 1  Preterm labor symptoms and general obstetric precautions including but not limited to vaginal bleeding, contractions, leaking of fluid and fetal movement were reviewed in detail with the patient. Please refer to After Visit Summary for other counseling recommendations.   Return in 2 weeks (on 05/20/2022).  Future Appointments  Date Time Provider DeSoquel2/04/2022  8:35 AM PiAletha HalimMD [facility]  05/15/2022  3:30 PM WMC-MFC NURSE [facility]MFC WMD[facility]2/05/2022  3:45 PM WMC-MFC US1 [facility]2/12/2022  8:55 AM NeCaren MacadamMD [facility]WH-WSCA [facility]  05/22/2022  3:30 PM WMC-MFC NURSE [facility]MC-MFC WMS[facility]2/12/2022  3:45 PM WMC-MFC US1 WMC-MFCUS WM[facility]2/15/2024  8:35 AM PiAletha HalimMD [facility]  05/29/2022  2:30 PM WMC-MFC NURSE WMC-MFC WMTexas [facility]2/16/2024  2:45 PM WMC-MFC US4 [facility]MFCUS WM[facility]2/21/2024 11:15 AM PrDonnamae JudeMD CWH-WSCA [facility]  06/05/2022  3:15 PM WMC-MFC NURSE [facility]MFC WM[facility]2/23/2024  3:30 PM WMC-MFC US3 WMC-MFCUS WMTexas [facility]2/28/2024  1:30 PM PrDonnamae JudeMD CWH-WSCA C[facility]  06/12/2022  3:30 PM WMC-MFC NURSE WMC-MFC WMThe Bridgeway3/04/2022  3:45 PM WMC-MFC US5 WMC-MFCUS WMTexoma [facility]3/09/2022  1:30 PM PrDonnamae JudeMD  [facility]  06/24/2022 11:15 AM Donnamae Jude, MD [facility]  07/01/2022 11:15 AM Donnamae Jude, MD [facility]  07/08/2022 11:15 AM Donnamae Jude, MD [facility]  08/25/2022 10:30 AM Ralene Bathe, MD ASC-ASC None  09/01/2022 10:30 AM  Ralene Bathe, MD ASC-ASC None    Donnamae Jude, MD

## 2022-05-06 NOTE — Progress Notes (Signed)
ROB   CC: None    

## 2022-05-10 ENCOUNTER — Encounter: Payer: Self-pay | Admitting: Family Medicine

## 2022-05-11 ENCOUNTER — Ambulatory Visit: Payer: No Typology Code available for payment source | Admitting: *Deleted

## 2022-05-11 VITALS — BP 147/88 | HR 90

## 2022-05-11 DIAGNOSIS — O133 Gestational [pregnancy-induced] hypertension without significant proteinuria, third trimester: Secondary | ICD-10-CM

## 2022-05-11 DIAGNOSIS — O139 Gestational [pregnancy-induced] hypertension without significant proteinuria, unspecified trimester: Secondary | ICD-10-CM

## 2022-05-11 DIAGNOSIS — O0990 Supervision of high risk pregnancy, unspecified, unspecified trimester: Secondary | ICD-10-CM

## 2022-05-11 DIAGNOSIS — Z3A32 32 weeks gestation of pregnancy: Secondary | ICD-10-CM

## 2022-05-11 HISTORY — DX: Gestational (pregnancy-induced) hypertension without significant proteinuria, unspecified trimester: O13.9

## 2022-05-11 NOTE — Progress Notes (Signed)
Subjective:  Deborah Pierce is a 32 y.o. female here for BP check.  Was having elevated BPs at home, ranging from 130-160/80-90's.   Hypertension ROS: Patient denies any headaches, visual symptoms, RUQ/epigastric pain or other concerning symptoms.  Objective:  BP (!) 147/88   Pulse 90   LMP 09/22/2021   Appearance alert, well appearing, and in no distress. General exam BP noted to be slightly elevated today in office.    Assessment:   Blood Pressure needs further observation. Lab work done today.  Plan:  [facility] on Thursday and MFM on Friday.   Crosby Oyster, RN .

## 2022-05-12 NOTE — Progress Notes (Signed)
Patient was assessed and managed by nursing staff during this encounter. I have reviewed the chart and agree with the documentation and plan. I have also made any necessary editorial changes.  Aletha Halim, MD 05/12/2022 8:20 AM

## 2022-05-13 LAB — COMPREHENSIVE METABOLIC PANEL
ALT: 17 IU/L (ref 0–32)
AST: 16 IU/L (ref 0–40)
Albumin/Globulin Ratio: 1.4 (ref 1.2–2.2)
Albumin: 3.6 g/dL — ABNORMAL LOW (ref 3.9–4.9)
Alkaline Phosphatase: 85 IU/L (ref 44–121)
BUN/Creatinine Ratio: 17 (ref 9–23)
BUN: 11 mg/dL (ref 6–20)
Bilirubin Total: <0.2 mg/dL (ref 0.0–1.2)
CO2: 19 mmol/L — ABNORMAL LOW (ref 20–29)
Calcium: 9.3 mg/dL (ref 8.7–10.2)
Chloride: 103 mmol/L (ref 96–106)
Creatinine, Ser: 0.63 mg/dL (ref 0.57–1.00)
Globulin, Total: 2.5 g/dL (ref 1.5–4.5)
Glucose: 77 mg/dL (ref 70–99)
Potassium: 4.7 mmol/L (ref 3.5–5.2)
Sodium: 138 mmol/L (ref 134–144)
Total Protein: 6.1 g/dL (ref 6.0–8.5)
eGFR: 122 mL/min/{1.73_m2} (ref >59)

## 2022-05-13 LAB — CBC
Hematocrit: 35 % (ref 34.0–46.6)
Hemoglobin: 11.8 g/dL (ref 11.1–15.9)
MCH: 28 pg (ref 26.6–33.0)
MCHC: 33.7 g/dL (ref 31.5–35.7)
MCV: 83 fL (ref 79–97)
Platelets: 380 10*3/uL (ref 150–450)
RBC: 4.22 x10E6/uL (ref 3.77–5.28)
RDW: 13.2 % (ref 11.7–15.4)
WBC: 11.7 10*3/uL — ABNORMAL HIGH (ref 3.4–10.8)

## 2022-05-13 LAB — PROTEIN / CREATININE RATIO, URINE
Creatinine, Urine: 141.8 mg/dL
Protein, Ur: 13.8 mg/dL
Protein/Creat Ratio: 97 mg/g creat (ref 0–200)

## 2022-05-14 ENCOUNTER — Ambulatory Visit: Payer: No Typology Code available for payment source | Admitting: Obstetrics and Gynecology

## 2022-05-14 VITALS — BP 136/86 | HR 97 | Wt 246.6 lb

## 2022-05-14 DIAGNOSIS — Z6837 Body mass index (BMI) 37.0-37.9, adult: Secondary | ICD-10-CM

## 2022-05-14 DIAGNOSIS — O24119 Pre-existing diabetes mellitus, type 2, in pregnancy, unspecified trimester: Secondary | ICD-10-CM

## 2022-05-14 DIAGNOSIS — Z3A32 32 weeks gestation of pregnancy: Secondary | ICD-10-CM

## 2022-05-14 DIAGNOSIS — D239 Other benign neoplasm of skin, unspecified: Secondary | ICD-10-CM

## 2022-05-14 DIAGNOSIS — O99213 Obesity complicating pregnancy, third trimester: Secondary | ICD-10-CM

## 2022-05-14 DIAGNOSIS — O24113 Pre-existing diabetes mellitus, type 2, in pregnancy, third trimester: Secondary | ICD-10-CM

## 2022-05-14 NOTE — Progress Notes (Signed)
PRENATAL VISIT NOTE  Subjective:  Deborah Pierce is a 32 y.o. G2P0010 at 54w2dbeing seen today for ongoing prenatal care.  She is currently monitored for the following issues for this high-risk pregnancy and has Controlled type 2 diabetes mellitus without complication, with long-term current use of insulin (HF[place]; Diabetic peripheral neuropathy (HWill[place]; Class 1 obesity due to excess calories with serious comorbidity and body mass index (BMI) of 34.0 to 34.9 in adult; Depression with anxiety; Supervision of high risk pregnancy, antepartum; Mixed hyperlipidemia; Vitamin D deficiency; Type 2 diabetes mellitus affecting pregnancy, antepartum; Obesity in pregnancy, antepartum, third trimester; Dysplastic nevus; and BMI 37.0-37.9, adult on their problem list.  Patient reports no complaints.  Contractions: Not present. Vag. Bleeding: None.  Movement: Present. Denies leaking of fluid.   The following portions of the patient's history were reviewed and updated as appropriate: allergies, current medications, past family history, past medical history, past social history, past surgical history and problem list.   Objective:   Vitals:   05/14/22 0844  BP: 136/86  Pulse: 97  Weight: 246 lb 9.6 oz (111.9 kg)    Fetal Status: Fetal Heart Rate (bpm): 142   Movement: Present     General:  Alert, oriented and cooperative. Patient is in no acute distress.  Skin: Skin is warm and dry. No rash noted.   Cardiovascular: Normal heart rate noted  Respiratory: Normal respiratory effort, no problems with respiration noted  Abdomen: Soft, gravid, appropriate for gestational age.  Pain/Pressure: Present     Pelvic: Cervical exam deferred        Extremities: Normal range of motion.  Edema: Trace  Mental Status: Normal mood and affect. Normal behavior. Normal judgment and thought content.   Assessment and Plan:  Pregnancy: G2P0010 at 339w2d. [redacted] weeks gestation of pregnancy Routine care. D/w patient re: birth  control at subsequent visits D/w her delivery timing likely around 37/0-38/6 weeks and will determine closer to 35 weeks  2. Type 2 diabetes mellitus affecting pregnancy, antepartum On omnipod for basal and separate SQ bolus for meals. AM fasting in the 80s but sometimes with some overnight lows. Post prandials in the 180s-200s. Patient co-managed with [facility]. I told her to increase her meal boluses to try and get close to go. F/u growth tomorrow. Start qwk testing tomorrow.  Fetal echo wnl 11/30 3. Dysplastic nevus Needs derm postpartum visit  4. Obesity in pregnancy, antepartum, third trimester Up 8 lbs since 1/24. Borderline BP today. Pre-eclampsia precautions given. Pt to start qwk testing and BP checks tomorrow  5. BMI 37.0-37.9, adult  Preterm labor symptoms and general obstetric precautions including but not limited to vaginal bleeding, contractions, leaking of fluid and fetal movement were reviewed in detail with the patient. Please refer to After Visit Summary for other counseling recommendations.   Return in about 2 weeks (around 05/28/2022) for in person, high risk ob, md visit.  Future Appointments  Date Time Provider DeAlpine2/05/2022  3:30 PM WMAdventist Rehabilitation Hospital Of MarylandURSE WMPlain[facility] HospitalMO[facility]2/05/2022  3:45 PM WMC-MFC US1 WMC-MFCUS WMAmar[facility]2/12/2022  8:55 AM NeCaren MacadamMD CWH-WSCA [facility]  05/22/2022  3:30 PM WMC-MFC NURSE WMC-MFC WMB[facility]2/12/2022  3:45 PM WMC-MFC US1 WMC-MFCUS WMVa [facility]2/15/2024  8:35 AM PiAletha HalimMD CWH-WSCA [facility]  05/29/2022  2:30 PM WMC-MFC NURSE WMC-MFC WMC[facility]2/16/2024  2:45 PM WMC-MFC US4 WMC-MFCUS WMWnc [facility]2/21/2024 11:15 AM PrDonnamae JudeMD CWH-WSCA [facility]  06/05/2022  3:15 PM WMC-MFC  NURSE [facility]  06/05/2022  3:30 PM WMC-MFC US3 WMC-MFCUS [facility]  06/10/2022  1:30 PM Donnamae Jude, MD CWH-WSCA [facility]  06/12/2022  3:30 PM WMC-MFC NURSE WMC-M[facility]  06/12/2022  3:45 PM WMC-MFC US5 WMC-MFCUS [facility]  06/17/2022  1:30 PM Donnamae Jude, MD  C[facility]WSCA [facility]  06/24/2022 11:15 AM Donnamae Jude, MD [facility]  07/01/2022 11:15 AM Donnamae Jude, MD [facility]  07/08/2022 11:15 AM Donnamae Jude, MD [facility]  08/25/2022 10:30 AM Ralene Bathe, MD ASC-[facility] None  09/01/2022 10:30 AM Ralene Bathe, MD ASC[facility]ASC None    Aletha Halim, MD

## 2022-05-14 NOTE — Progress Notes (Signed)
NZ:[id number]

## 2022-05-15 ENCOUNTER — Ambulatory Visit: Payer: No Typology Code available for payment source | Attending: Maternal & Fetal Medicine

## 2022-05-15 ENCOUNTER — Ambulatory Visit: Payer: No Typology Code available for payment source | Admitting: *Deleted

## 2022-05-15 VITALS — BP 134/74 | HR 91

## 2022-05-15 DIAGNOSIS — O24113 Pre-existing diabetes mellitus, type 2, in pregnancy, third trimester: Secondary | ICD-10-CM

## 2022-05-15 DIAGNOSIS — Z3A32 32 weeks gestation of pregnancy: Secondary | ICD-10-CM

## 2022-05-15 DIAGNOSIS — Z794 Long term (current) use of insulin: Secondary | ICD-10-CM

## 2022-05-15 DIAGNOSIS — E119 Type 2 diabetes mellitus without complications: Secondary | ICD-10-CM | POA: Diagnosis not present

## 2022-05-15 DIAGNOSIS — O24013 Pre-existing diabetes mellitus, type 1, in pregnancy, third trimester: Secondary | ICD-10-CM | POA: Insufficient documentation

## 2022-05-15 DIAGNOSIS — O99213 Obesity complicating pregnancy, third trimester: Secondary | ICD-10-CM

## 2022-05-15 DIAGNOSIS — O0990 Supervision of high risk pregnancy, unspecified, unspecified trimester: Secondary | ICD-10-CM | POA: Insufficient documentation

## 2022-05-15 DIAGNOSIS — O3663X0 Maternal care for excessive fetal growth, third trimester, not applicable or unspecified: Secondary | ICD-10-CM | POA: Diagnosis not present

## 2022-05-15 DIAGNOSIS — O24119 Pre-existing diabetes mellitus, type 2, in pregnancy, unspecified trimester: Secondary | ICD-10-CM | POA: Diagnosis present

## 2022-05-15 DIAGNOSIS — Z7984 Long term (current) use of oral hypoglycemic drugs: Secondary | ICD-10-CM

## 2022-05-15 DIAGNOSIS — E669 Obesity, unspecified: Secondary | ICD-10-CM

## 2022-05-18 ENCOUNTER — Encounter (HOSPITAL_COMMUNITY): Payer: Self-pay | Admitting: Family Medicine

## 2022-05-18 ENCOUNTER — Inpatient Hospital Stay (HOSPITAL_COMMUNITY)
Admission: AD | Admit: 2022-05-18 | Discharge: 2022-05-18 | Disposition: A | Discharge status: Home / Self Care | Payer: No Typology Code available for payment source | Attending: Family Medicine | Admitting: Family Medicine

## 2022-05-18 DIAGNOSIS — R519 Headache, unspecified: Secondary | ICD-10-CM | POA: Insufficient documentation

## 2022-05-18 DIAGNOSIS — Z3A32 32 weeks gestation of pregnancy: Secondary | ICD-10-CM

## 2022-05-18 DIAGNOSIS — O26893 Other specified pregnancy related conditions, third trimester: Secondary | ICD-10-CM | POA: Diagnosis not present

## 2022-05-18 DIAGNOSIS — O133 Gestational [pregnancy-induced] hypertension without significant proteinuria, third trimester: Secondary | ICD-10-CM

## 2022-05-18 DIAGNOSIS — O139 Gestational [pregnancy-induced] hypertension without significant proteinuria, unspecified trimester: Secondary | ICD-10-CM | POA: Insufficient documentation

## 2022-05-18 DIAGNOSIS — O1413 Severe pre-eclampsia, third trimester: Secondary | ICD-10-CM | POA: Insufficient documentation

## 2022-05-18 LAB — COMPREHENSIVE METABOLIC PANEL
ALT: 17 U/L (ref 0–44)
AST: 24 U/L (ref 15–41)
Albumin: 2.5 g/dL — ABNORMAL LOW (ref 3.5–5.0)
Alkaline Phosphatase: 78 U/L (ref 38–126)
Anion gap: 13 (ref 5–15)
BUN: 12 mg/dL (ref 6–20)
CO2: 21 mmol/L — ABNORMAL LOW (ref 22–32)
Calcium: 9.3 mg/dL (ref 8.9–10.3)
Chloride: 102 mmol/L (ref 98–111)
Creatinine, Ser: 0.74 mg/dL (ref 0.44–1.00)
GFR, Estimated: >60 mL/min (ref >60)
Glucose, Bld: 168 mg/dL — ABNORMAL HIGH (ref 70–99)
Potassium: 4 mmol/L (ref 3.5–5.1)
Sodium: 136 mmol/L (ref 135–145)
Total Bilirubin: 0.1 mg/dL — ABNORMAL LOW (ref 0.3–1.2)
Total Protein: 5.8 g/dL — ABNORMAL LOW (ref 6.5–8.1)

## 2022-05-18 LAB — URINALYSIS, ROUTINE W REFLEX MICROSCOPIC
Bilirubin Urine: NEGATIVE
Glucose, UA: >=500 mg/dL — AB
Hgb urine dipstick: NEGATIVE
Ketones, ur: 20 mg/dL — AB
Nitrite: NEGATIVE
Protein, ur: NEGATIVE mg/dL
Specific Gravity, Urine: 1.025 (ref 1.005–1.030)
pH: 6 (ref 5.0–8.0)

## 2022-05-18 LAB — CBC
HCT: 34 % — ABNORMAL LOW (ref 36.0–46.0)
Hemoglobin: 11.4 g/dL — ABNORMAL LOW (ref 12.0–15.0)
MCH: 28.3 pg (ref 26.0–34.0)
MCHC: 33.5 g/dL (ref 30.0–36.0)
MCV: 84.4 fL (ref 80.0–100.0)
Platelets: 368 10*3/uL (ref 150–400)
RBC: 4.03 MIL/uL (ref 3.87–5.11)
RDW: 14.5 % (ref 11.5–15.5)
WBC: 12.5 10*3/uL — ABNORMAL HIGH (ref 4.0–10.5)
nRBC: 0 % (ref 0.0–0.2)

## 2022-05-18 LAB — PROTEIN / CREATININE RATIO, URINE
Creatinine, Urine: 107 mg/dL
Protein Creatinine Ratio: 0.17 mg/mg{Cre} — ABNORMAL HIGH (ref 0.00–0.15)
Total Protein, Urine: 18 mg/dL

## 2022-05-18 MED ORDER — NIFEDIPINE ER OSMOTIC RELEASE 30 MG PO TB24: 30.0000 mg | ORAL_TABLET | Freq: Every day | ORAL | 11 refills | Status: DC

## 2022-05-18 MED ORDER — LABETALOL HCL 5 MG/ML IV SOLN
20.0000 mg | INTRAVENOUS | Status: DC | PRN
  Administered 2022-05-18: 20 mg via INTRAVENOUS
  Filled 2022-05-18: qty 4

## 2022-05-18 MED ORDER — HYDRALAZINE HCL 20 MG/ML IJ SOLN: 10.0000 mg | INTRAMUSCULAR | Status: DC | PRN

## 2022-05-18 MED ORDER — LABETALOL HCL 5 MG/ML IV SOLN: 40.0000 mg | INTRAVENOUS | Status: DC | PRN

## 2022-05-18 MED ORDER — ACETAMINOPHEN 500 MG PO TABS
1000.0000 mg | ORAL_TABLET | Freq: Once | ORAL | Status: AC
  Administered 2022-05-18: 1000 mg via ORAL
  Filled 2022-05-18: qty 2

## 2022-05-18 MED ORDER — LABETALOL HCL 5 MG/ML IV SOLN: 80.0000 mg | INTRAVENOUS | Status: DC | PRN

## 2022-05-18 NOTE — MAU Note (Signed)
..  Deborah Pierce is a 32 y.o. at 51w6dhere in MAU reporting: reports elevated blood pressure at home and [place] the fire department (170's/90's) Has a headache.  Denies visual changes or epigastric pain.  Denies vaginal bleeding, leaking of fluid, or ctx.   Pain score: 2/10  FYLT:EIHDTPNin room  Lab orders placed from triage:  UA

## 2022-05-18 NOTE — MAU Provider Note (Signed)
History     CSN: [id number]  Arrival date and time: 05/18/22 0009   Event Date/Time   First Provider Initiated Contact with Patient 05/18/22 0055      Chief Complaint  Patient presents with   Hypertension   Deborah Pierce , a  32 y.o. G2P0010 at 58w6dpresents to MAU with complaints of elevated BPs at home. Patient reports she took her BP around 2330 and was 160's/100's and upon recheck was 170/100. 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 She endorses a "slight headache." Currently rating pain a 2/10. Denies worsening or alleviating symptoms.  She denies visual changes and epigastric pain. She also denies swelling that does not improve with rest and relaxation. She endorses positive fetal movement and denies vaginal or urinary complaints.          OB History     Gravida  2   Para  0   Term  0   Preterm      AB  1   Living  0      SAB  1   IAB      Ectopic      Multiple      Live Births  0        Obstetric Comments  March 2022 spontaneous abortion/miscarriage         Past Medical History:  Diagnosis Date   Diabetes mellitus without complication ([facility]    Dysplastic nevus 02/20/2022   Severe, R to mid upper back paraspinal, needs excision   Dysplastic nevus 02/20/2022   Severe, L mid back paraspinal, needs excision   Dysplastic nevus 02/20/2022   mild, L lateral buttocks   Other fatigue 08/12/2021   Pregnancy induced hypertension 05/11/2022   Pt states they just noticed about a week ago.   Seasonal allergies 08/07/2011    Past Surgical History:  Procedure Laterality Date   LAPAROSCOPY     looking for endometriosis    Family History  Problem Relation Age of Onset   Hypertension Mother    Heart disease Mother    Cancer Mother    Diabetes Mother    Diabetes type II Mother    Melanoma Mother    Diabetes Sister    Heart disease Maternal Grandmother    Hypertension  Maternal Grandmother    Diabetes Maternal Grandmother    Cancer Maternal Grandfather    Lung cancer Maternal Grandfather    Brain cancer Maternal Grandfather    Asthma Neg Hx     Social History   Tobacco Use   Smoking status: Never   Smokeless tobacco: Never  Vaping Use   Vaping Use: Never used  Substance Use Topics   Alcohol use: Not Currently   Drug use: Not Currently    Types: Marijuana    Comment: daily with anxiety; but not since preg    Allergies:  Allergies  Allergen Reactions   Fruit & Vegetable Daily [Black Cohosh-Soyisoflav-C Quad] Anaphylaxis    Apples, oraganges, kiwi, peaches   Penicillins Anaphylaxis    swelling   Insulins Nausea Only and Rash    Insulin glargine, degludec, aspart     Tree Extract Itching   Bee Venom Rash    unknown   Insulin Degludec-Liraglutide Rash   Insulin Glargine-Lixisenatide Rash   Sulfa Antibiotics Rash    Medications Prior to Admission  Medication Sig Dispense Refill Last Dose   aspirin 81 MG  chewable tablet Chew 1 tablet (81 mg total) by mouth daily. 90 tablet 2 05/17/2022   Cetirizine HCl (ZYRTEC ALLERGY PO) Take by mouth.   05/17/2022   cholecalciferol (VITAMIN D3) 25 MCG (1000 UNIT) tablet Take 6,000 Units by mouth daily.   05/17/2022   FLUoxetine (PROZAC) 40 MG capsule Take 1 capsule (40 mg total) by mouth daily. 90 capsule 1 05/17/2022   insulin aspart (NOVOLOG) 100 UNIT/ML FlexPen Sliding scale insulin Less than 70 initiate hypoglycemia protocol 70-120  0 units 120-150 2 units 151-200 3 units 201-250 5units 251-300 8 units 301-350 11 units 351-400 15 units Greater than 400 call MD 15 mL 11 05/17/2022   insulin degludec (TRESIBA FLEXTOUCH) 200 UNIT/ML FlexTouch Pen Inject 40 Units into the skin at bedtime.   05/17/2022   metFORMIN (GLUCOPHAGE) 1000 MG tablet Take 1 tablet (1,000 mg total) by mouth 2 (two) times daily with a meal. 180 tablet 1 05/17/2022   prenatal vitamin w/FE, FA (PRENATAL 1 + 1) 27-1 MG TABS tablet Take 1 tablet by  mouth daily at 12 noon.   05/17/2022   blood glucose meter kit and supplies KIT Dispense based on patient and insurance preference. Use up to four times daily as directed. (FOR ICD-9 250.00, 250.01). 1 each 0    Continuous Blood Gluc Receiver (DEXCOM G6 RECEIVER) DEVI ONE EACH BY DOES NOT APPLY ROUTE ONCE FOR 1 DOSE.      Continuous Blood Gluc Sensor (DEXCOM G6 SENSOR) MISC SMARTSIG:1 Each Topical Every 10 Days      hydrOXYzine (ATARAX/VISTARIL) 25 MG tablet Take 25 mg by mouth daily as needed for anxiety or itching.      Insulin Syringe-Needle U-100 (INSULIN SYRINGE .5CC/31GX5/16") 31G X 5/16" 0.5 ML MISC Use as directed 100 each 0    ondansetron (ZOFRAN-ODT) 4 MG disintegrating tablet Take 1 tablet (4 mg total) by mouth every 8 (eight) hours as needed for nausea. 42 tablet 2     Review of Systems  Constitutional:  Negative for chills, fatigue and fever.  Eyes:  Negative for pain and visual disturbance.  Respiratory:  Negative for apnea, shortness of breath and wheezing.   Cardiovascular:  Negative for chest pain and palpitations.  Gastrointestinal:  Negative for abdominal pain, constipation, diarrhea, nausea and vomiting.  Genitourinary:  Negative for difficulty urinating, dysuria, pelvic pain, vaginal bleeding, vaginal discharge and vaginal pain.  Musculoskeletal:  Negative for back pain.  Neurological:  Negative for seizures, weakness and headaches.  Psychiatric/Behavioral:  Negative for suicidal ideas.    Physical Exam   Blood pressure (!) 165/81, pulse 84, temperature 97.9 F (36.6 C), temperature source Oral, resp. rate 17, height '5\' 7"'$  (1.702 m), weight 113.4 kg, last menstrual period 09/22/2021, SpO2 98 %.  Physical Exam Vitals and nursing note reviewed.  Constitutional:      General: She is not in acute distress.    Appearance: Normal appearance.  HENT:     Head: Normocephalic.  Cardiovascular:     Rate and Rhythm: Normal rate and regular rhythm.  Pulmonary:     Effort:  Pulmonary effort is normal.  Musculoskeletal:        General: Swelling present. Normal range of motion.     Cervical back: Normal range of motion.     Comments: Mild non-pitting bilateral pedal edema noted.   Skin:    General: Skin is warm and dry.     Capillary Refill: Capillary refill takes less than 2 seconds.  Neurological:  Mental Status: She is alert and oriented to person, place, and time.  Psychiatric:        Mood and Affect: Mood normal.    FHT: 150 bpm with moderate variability. Accels present. No decels noted (appropriate for gestational age.)  - Toco ;quiet  MAU Course  Procedures Orders Placed This Encounter  Procedures   Urinalysis, Routine w reflex microscopic -Urine, Clean Catch   CBC   Comprehensive metabolic panel   Protein / creatinine ratio, urine   Notify physician (specify) Confirmatory reading of BP> 160/110 15 minutes later   Apply Hypertensive Disorders of Pregnancy Care Plan   Measure blood pressure   Results for orders placed or performed during the hospital encounter of 05/18/22 (from the past 24 hour(s))  Urinalysis, Routine w reflex microscopic -Urine, Clean Catch     Status: Abnormal   Collection Time: 05/18/22 12:36 AM  Result Value Ref Range   Color, Urine YELLOW YELLOW   APPearance HAZY (A) CLEAR   Specific Gravity, Urine 1.025 1.005 - 1.030   pH 6.0 5.0 - 8.0   Glucose, UA >=500 (A) NEGATIVE mg/dL   Hgb urine dipstick NEGATIVE NEGATIVE   Bilirubin Urine NEGATIVE NEGATIVE   Ketones, ur 20 (A) NEGATIVE mg/dL   Protein, ur NEGATIVE NEGATIVE mg/dL   Nitrite NEGATIVE NEGATIVE   Leukocytes,Ua MODERATE (A) NEGATIVE   RBC / HPF 0-5 0 - 5 RBC/hpf   WBC, UA 6-10 0 - 5 WBC/hpf   Bacteria, UA RARE (A) NONE SEEN   Squamous Epithelial / HPF 0-5 0 - 5 /HPF   Mucus PRESENT   Protein / creatinine ratio, urine     Status: Abnormal   Collection Time: 05/18/22 12:36 AM  Result Value Ref Range   Creatinine, Urine 107 mg/dL   Total Protein, Urine 18  mg/dL   Protein Creatinine Ratio 0.17 (H) 0.00 - 0.15 mg/mg[Cre]  CBC     Status: Abnormal   Collection Time: 05/18/22 12:50 AM  Result Value Ref Range   WBC 12.5 (H) 4.0 - 10.5 K/uL   RBC 4.03 3.87 - 5.11 MIL/uL   Hemoglobin 11.4 (L) 12.0 - 15.0 g/dL   HCT 34.0 (L) 36.0 - 46.0 %   MCV 84.4 80.0 - 100.0 fL   MCH 28.3 26.0 - 34.0 pg   MCHC 33.5 30.0 - 36.0 g/dL   RDW 14.5 11.5 - 15.5 %   Platelets 368 150 - 400 K/uL   nRBC 0.0 0.0 - 0.2 %   Patient Vitals for the past 24 hrs:  BP Temp Temp src Pulse Resp SpO2 Height Weight  05/18/22 0219 -- -- -- -- -- 98 % -- --  05/18/22 0215 130/84 -- -- 93 17 -- -- --  05/18/22 0214 -- -- -- -- -- 97 % -- --  05/18/22 0209 -- -- -- -- -- 97 % -- --  05/18/22 0204 -- -- -- -- -- 97 % -- --  05/18/22 0200 (!) 142/82 -- -- 96 -- 98 % -- --  05/18/22 0155 -- -- -- -- -- 96 % -- --  05/18/22 0150 -- -- -- -- -- 97 % -- --  05/18/22 0145 137/74 -- -- 93 -- 97 % -- --  05/18/22 0140 -- -- -- -- -- 97 % -- --  05/18/22 0135 -- -- -- -- -- 97 % -- --  05/18/22 0130 (!) 140/78 -- -- 90 -- 97 % -- --  05/18/22 0125 -- -- -- -- -- 97 % -- --  05/18/22 0120 -- -- -- -- -- 97 % -- --  05/18/22 0115 (!) 151/79 -- -- 89 -- 98 % -- --  05/18/22 0110 -- -- -- -- -- 98 % -- --  05/18/22 0105 -- -- -- -- -- 98 % -- --  05/18/22 0100 134/83 -- -- 90 -- 97 % -- --  05/18/22 0055 -- -- -- -- -- 97 % -- --  05/18/22 0050 -- -- -- -- -- 97 % -- --  05/18/22 0045 (!) 165/81 -- -- 84 -- 98 % -- --  05/18/22 0041 (!) 170/80 97.9 F (36.6 C) Oral -- 17 -- -- --  05/18/22 0040 -- -- -- -- -- 97 % -- --  05/18/22 0035 (!) 170/80 -- -- 90 -- 97 % -- --  05/18/22 0029 -- -- -- -- -- -- '5\' 7"'$  (1.702 m) 113.4 kg    MDM - UA reflexed to culture  - Tylenol given for headache.  - PreElabs normal, BPs mild while in MAU. - Headache relieved with PO Tylenol  - 2  Severe range BPs in MAU otherwise normal to borderline BPs.  - Consulted Dr. Kennon Rounds on gestational  hypertension with normal preE Labs. Per MD start patient on Procardia '30mg'$  daily and schedule patient for a BP check in the office this week.  - Plan for discharge   Assessment and Plan   1. Gestational hypertension, third trimester   2. [redacted] weeks gestation of pregnancy   3. Pregnancy headache in third trimester    - Discussed likelihood of gestational hypertension in pregnancy.  - Reviewed how to take BPs and standard times for rechecks.  - Worsening signs and return precautions reviewed.  - Rx for Procardia sent to outpatient pharmacy.   - FHT appropriate for gestational age upon time of discharge.  - Strict PreE precautions reviewed.  - Patient discharged home in stable condition and may return to MAU as needed.   Jacquiline Doe, MSN CNM  05/18/2022, 12:55 AM

## 2022-05-19 LAB — CULTURE, OB URINE: Culture: 70000 — AB

## 2022-05-22 ENCOUNTER — Encounter (HOSPITAL_COMMUNITY): Payer: Self-pay | Admitting: Obstetrics and Gynecology

## 2022-05-22 ENCOUNTER — Ambulatory Visit (HOSPITAL_BASED_OUTPATIENT_CLINIC_OR_DEPARTMENT_OTHER): Payer: No Typology Code available for payment source

## 2022-05-22 ENCOUNTER — Ambulatory Visit: Payer: No Typology Code available for payment source

## 2022-05-22 ENCOUNTER — Inpatient Hospital Stay (HOSPITAL_COMMUNITY)
Admission: AD | Admit: 2022-05-22 | Discharge: 2022-05-22 | Disposition: A | Discharge status: Home / Self Care | Payer: No Typology Code available for payment source | Attending: Obstetrics and Gynecology | Admitting: Obstetrics and Gynecology

## 2022-05-22 ENCOUNTER — Ambulatory Visit (INDEPENDENT_AMBULATORY_CARE_PROVIDER_SITE_OTHER): Payer: No Typology Code available for payment source | Admitting: Family Medicine

## 2022-05-22 VITALS — BP 148/89 | HR 89 | Wt 247.0 lb

## 2022-05-22 VITALS — BP 171/83 | HR 95

## 2022-05-22 DIAGNOSIS — O24013 Pre-existing diabetes mellitus, type 1, in pregnancy, third trimester: Secondary | ICD-10-CM | POA: Insufficient documentation

## 2022-05-22 DIAGNOSIS — O99213 Obesity complicating pregnancy, third trimester: Secondary | ICD-10-CM

## 2022-05-22 DIAGNOSIS — R030 Elevated blood-pressure reading, without diagnosis of hypertension: Secondary | ICD-10-CM | POA: Diagnosis present

## 2022-05-22 DIAGNOSIS — E119 Type 2 diabetes mellitus without complications: Secondary | ICD-10-CM | POA: Diagnosis not present

## 2022-05-22 DIAGNOSIS — Z3A33 33 weeks gestation of pregnancy: Secondary | ICD-10-CM

## 2022-05-22 DIAGNOSIS — O133 Gestational [pregnancy-induced] hypertension without significant proteinuria, third trimester: Secondary | ICD-10-CM

## 2022-05-22 DIAGNOSIS — Z794 Long term (current) use of insulin: Secondary | ICD-10-CM

## 2022-05-22 DIAGNOSIS — R519 Headache, unspecified: Secondary | ICD-10-CM | POA: Diagnosis present

## 2022-05-22 DIAGNOSIS — O26893 Other specified pregnancy related conditions, third trimester: Secondary | ICD-10-CM

## 2022-05-22 DIAGNOSIS — O24119 Pre-existing diabetes mellitus, type 2, in pregnancy, unspecified trimester: Secondary | ICD-10-CM | POA: Insufficient documentation

## 2022-05-22 DIAGNOSIS — E669 Obesity, unspecified: Secondary | ICD-10-CM

## 2022-05-22 DIAGNOSIS — Z3689 Encounter for other specified antenatal screening: Secondary | ICD-10-CM

## 2022-05-22 DIAGNOSIS — O0990 Supervision of high risk pregnancy, unspecified, unspecified trimester: Secondary | ICD-10-CM | POA: Insufficient documentation

## 2022-05-22 DIAGNOSIS — O24113 Pre-existing diabetes mellitus, type 2, in pregnancy, third trimester: Secondary | ICD-10-CM

## 2022-05-22 DIAGNOSIS — O3663X0 Maternal care for excessive fetal growth, third trimester, not applicable or unspecified: Secondary | ICD-10-CM

## 2022-05-22 DIAGNOSIS — O10913 Unspecified pre-existing hypertension complicating pregnancy, third trimester: Secondary | ICD-10-CM | POA: Diagnosis present

## 2022-05-22 DIAGNOSIS — Z7984 Long term (current) use of oral hypoglycemic drugs: Secondary | ICD-10-CM

## 2022-05-22 LAB — CBC
HCT: 37.1 % (ref 36.0–46.0)
Hemoglobin: 12.3 g/dL (ref 12.0–15.0)
MCH: 27.6 pg (ref 26.0–34.0)
MCHC: 33.2 g/dL (ref 30.0–36.0)
MCV: 83.4 fL (ref 80.0–100.0)
Platelets: 358 10*3/uL (ref 150–400)
RBC: 4.45 MIL/uL (ref 3.87–5.11)
RDW: 14.4 % (ref 11.5–15.5)
WBC: 12.7 10*3/uL — ABNORMAL HIGH (ref 4.0–10.5)
nRBC: 0 % (ref 0.0–0.2)

## 2022-05-22 LAB — COMPREHENSIVE METABOLIC PANEL
ALT: 18 U/L (ref 0–44)
AST: 22 U/L (ref 15–41)
Albumin: 2.6 g/dL — ABNORMAL LOW (ref 3.5–5.0)
Alkaline Phosphatase: 82 U/L (ref 38–126)
Anion gap: 12 (ref 5–15)
BUN: 17 mg/dL (ref 6–20)
CO2: 23 mmol/L (ref 22–32)
Calcium: 9.2 mg/dL (ref 8.9–10.3)
Chloride: 97 mmol/L — ABNORMAL LOW (ref 98–111)
Creatinine, Ser: 0.7 mg/dL (ref 0.44–1.00)
GFR, Estimated: >60 mL/min (ref >60)
Glucose, Bld: 91 mg/dL (ref 70–99)
Potassium: 4.5 mmol/L (ref 3.5–5.1)
Sodium: 132 mmol/L — ABNORMAL LOW (ref 135–145)
Total Bilirubin: 0.4 mg/dL (ref 0.3–1.2)
Total Protein: 6.2 g/dL — ABNORMAL LOW (ref 6.5–8.1)

## 2022-05-22 LAB — PROTEIN / CREATININE RATIO, URINE
Creatinine, Urine: 68 mg/dL
Protein Creatinine Ratio: 0.12 mg/mg{Cre} (ref 0.00–0.15)
Total Protein, Urine: 8 mg/dL

## 2022-05-22 MED ORDER — CAFFEINE 200 MG PO TABS
100.0000 mg | ORAL_TABLET | Freq: Once | ORAL | Status: AC
  Administered 2022-05-22: 100 mg via ORAL
  Filled 2022-05-22: qty 1

## 2022-05-22 MED ORDER — LACTATED RINGERS IV BOLUS
1000.0000 mL | Freq: Once | INTRAVENOUS | Status: AC
  Administered 2022-05-22: 1000 mL via INTRAVENOUS

## 2022-05-22 NOTE — MAU Provider Note (Signed)
History     CSN: [id number]  Arrival date and time: 05/22/22 1032   Event Date/Time   First Provider Initiated Contact with Patient 05/22/22 1136      Chief Complaint  Patient presents with   Headache   Hypertension   Deborah Pierce is a 32 y.o. G2P0010 at 88w3dwho receives care at [facility]  She presents today for Elevated BP and HA. She was seen today in the office with elevation noted. She reports she had a HA last night and went to sleep with no improvement.  She reports she took Tylenol 10068mat 0800 with no improvement. She states the HA is located behind her eyes and describes it as pressure. She reports it is improved with dark room and closed eyes and has no worsening factors. She rates the HA a 4-5/10, but states it was a 6/10 prior to Tylenol dosing.  She endorses a h/o headaches that is usually treated with Excedrin. No visual changes, RUQ pain, or edema.  Patient endorses fetal movement and denies vaginal concerns. No abdominal cramping or contractions.   OB History     Gravida  2   Para  0   Term  0   Preterm      AB  1   Living  0      SAB  1   IAB      Ectopic      Multiple      Live Births  0        Obstetric Comments  March 2022 spontaneous abortion/miscarriage         Past Medical History:  Diagnosis Date   Di[facility] mellitus without complication ([facility]   Dysplastic nevus 02/20/2022   Severe, R to mid upper back paraspinal, needs excision   Dysplastic nevus 02/20/2022   Severe, L mid back paraspinal, needs excision   Dysplastic nevus 02/20/2022   mild, L lateral buttocks   Other fatigue 08/12/2021   Pregnancy induced hypertension 05/11/2022   Pt states they just noticed about a week ago.   Seasonal allergies 08/07/2011    Past Surgical History:  Procedure Laterality Date   LAPAROSCOPY     looking for endometriosis    Family History  Problem Relation Age of Onset   Hypertension Mother    Heart disease Mother    Cancer Mother     Diabetes Mother    Diabetes type II Mother    Melanoma Mother    Diabetes Sister    Heart disease Maternal Grandmother    Hypertension Maternal Grandmother    Diabetes Maternal Grandmother    Cancer Maternal Grandfather    Lung cancer Maternal Grandfather    Brain cancer Maternal Grandfather    Asthma Neg Hx     Social History   Tobacco Use   Smoking status: Never   Smokeless tobacco: Never  Vaping Use   Vaping Use: Never used  Substance Use Topics   Alcohol use: Not Currently   Drug use: Not Currently    Types: Marijuana    Comment: daily with anxiety; but not since preg    Allergies:  Allergies  Allergen Reactions   Fruit & Vegetable Daily [Black Cohosh-Soyisoflav-C Quad] Anaphylaxis    Apples, oraganges, kiwi, peaches   Penicillins Anaphylaxis    swelling   Insulins Nausea Only and Rash    Insulin glargine, degludec, aspart     Tree Extract Itching   Bee Venom Rash    unknown  Insulin Degludec-Liraglutide Rash   Insulin Glargine-Lixisenatide Rash   Sulfa Antibiotics Rash    Medications Prior to Admission  Medication Sig Dispense Refill Last Dose   aspirin 81 MG chewable tablet Chew 1 tablet (81 mg total) by mouth daily. 90 tablet 2 05/22/2022   Cetirizine HCl (ZYRTEC ALLERGY PO) Take by mouth.   05/22/2022   cholecalciferol (VITAMIN D3) 25 MCG (1000 UNIT) tablet Take 6,000 Units by mouth daily.   05/22/2022   FLUoxetine (PROZAC) 40 MG capsule Take 1 capsule (40 mg total) by mouth daily. 90 capsule 1 05/22/2022   hydrOXYzine (ATARAX/VISTARIL) 25 MG tablet Take 25 mg by mouth daily as needed for anxiety or itching.   Past Month   Insulin Syringe-Needle U-100 (INSULIN SYRINGE .5CC/31GX5/16") 31G X 5/16" 0.5 ML MISC Use as directed 100 each 0 05/22/2022   metFORMIN (GLUCOPHAGE) 1000 MG tablet Take 1 tablet (1,000 mg total) by mouth 2 (two) times daily with a meal. 180 tablet 1 05/22/2022   NIFEdipine (PROCARDIA-XL/NIFEDICAL-XL) 30 MG 24 hr tablet Take 1 tablet (30 mg  total) by mouth daily. 30 tablet 11 05/21/2022   prenatal vitamin w/FE, FA (PRENATAL 1 + 1) 27-1 MG TABS tablet Take 1 tablet by mouth daily at 12 noon.   05/22/2022   blood glucose meter kit and supplies KIT Dispense based on patient and insurance preference. Use up to four times daily as directed. (FOR ICD-9 250.00, 250.01). 1 each 0    Continuous Blood Gluc Receiver (DEXCOM G6 RECEIVER) DEVI ONE EACH BY DOES NOT APPLY ROUTE ONCE FOR 1 DOSE.      Continuous Blood Gluc Sensor (DEXCOM G6 SENSOR) MISC SMARTSIG:1 Each Topical Every 10 Days      insulin aspart (NOVOLOG) 100 UNIT/ML FlexPen Sliding scale insulin Less than 70 initiate hypoglycemia protocol 70-120  0 units 120-150 2 units 151-200 3 units 201-250 5units 251-300 8 units 301-350 11 units 351-400 15 units Greater than 400 call MD 15 mL 11    insulin degludec (TRESIBA FLEXTOUCH) 200 UNIT/ML FlexTouch Pen Inject 40 Units into the skin at bedtime.      ondansetron (ZOFRAN-ODT) 4 MG disintegrating tablet Take 1 tablet (4 mg total) by mouth every 8 (eight) hours as needed for nausea. 42 tablet 2 More than a month    Review of Systems  Constitutional:  Negative for chills and fever.  Gastrointestinal:  Negative for nausea and vomiting.  Genitourinary:  Negative for difficulty urinating, dysuria, vaginal bleeding and vaginal discharge.  Neurological:  Positive for headaches. Negative for dizziness and light-headedness.   Physical Exam   Blood pressure (!) 148/86, pulse 97, resp. rate 18, height 5' 7"$  (1.702 m), weight 112 kg, last menstrual period 09/22/2021. Vitals:   05/22/22 1054 05/22/22 1056 05/22/22 1114 05/22/22 1130  BP: (!) 148/86 (!) 146/86 (!) 148/78 122/63   05/22/22 1145 05/22/22 1200 05/22/22 1215  BP: 130/79 (!) 144/84 136/87    Physical Exam Constitutional:      Appearance: She is well-developed.  HENT:     Head: Normocephalic and atraumatic.  Eyes:     Conjunctiva/sclera: Conjunctivae normal.  Cardiovascular:     Rate  and Rhythm: Normal rate and regular rhythm.  Pulmonary:     Effort: Pulmonary effort is normal. No respiratory distress.     Breath sounds: Normal breath sounds.  Abdominal:     Palpations: Abdomen is soft.     Tenderness: There is no abdominal tenderness.     Comments: Gravid, Appears LGA  Musculoskeletal:        General: Normal range of motion.     Cervical back: Normal range of motion.     Right lower leg: Edema present.     Left lower leg: Edema present.  Skin:    General: Skin is warm and dry.  Neurological:     Mental Status: She is alert.  Psychiatric:        Mood and Affect: Mood normal.        Behavior: Behavior normal.     Fetal Assessment 155 bpm, Mod Var, -Decels, Accels Toco: No ctx graphed  MAU Course   Results for orders placed or performed during the hospital encounter of 05/22/22 (from the past 24 hour(s))  Comprehensive metabolic panel     Status: Abnormal   Collection Time: 05/22/22 10:47 AM  Result Value Ref Range   Sodium 132 (L) 135 - 145 mmol/L   Potassium 4.5 3.5 - 5.1 mmol/L   Chloride 97 (L) 98 - 111 mmol/L   CO2 23 22 - 32 mmol/L   Glucose, Bld 91 70 - 99 mg/dL   BUN 17 6 - 20 mg/dL   Creatinine, Ser 0.70 0.44 - 1.00 mg/dL   Calcium 9.2 8.9 - 10.3 mg/dL   Total Protein 6.2 (L) 6.5 - 8.1 g/dL   Albumin 2.6 (L) 3.5 - 5.0 g/dL   AST 22 15 - 41 U/L   ALT 18 0 - 44 U/L   Alkaline Phosphatase 82 38 - 126 U/L   Total Bilirubin 0.4 0.3 - 1.2 mg/dL   GFR, Estimated >60 >60 mL/min   Anion gap 12 5 - 15  CBC     Status: Abnormal   Collection Time: 05/22/22 10:47 AM  Result Value Ref Range   WBC 12.7 (H) 4.0 - 10.5 K/uL   RBC 4.45 3.87 - 5.11 MIL/uL   Hemoglobin 12.3 12.0 - 15.0 g/dL   HCT 37.1 36.0 - 46.0 %   MCV 83.4 80.0 - 100.0 fL   MCH 27.6 26.0 - 34.0 pg   MCHC 33.2 30.0 - 36.0 g/dL   RDW 14.4 11.5 - 15.5 %   Platelets 358 150 - 400 K/uL   nRBC 0.0 0.0 - 0.2 %  Protein / creatinine ratio, urine     Status: None   Collection Time:  05/22/22 11:12 AM  Result Value Ref Range   Creatinine, Urine 68 mg/dL   Total Protein, Urine 8 mg/dL   Protein Creatinine Ratio 0.12 0.00 - 0.15 mg/mg[Cre]   No results found.  MDM Start IV, LR Bolus Physical Exam Labs: CBC, CMP, PC Ratio Measure BPQ15 min EFM Pain Management Consult Assessment and Plan  33 year old G2P0010  SIUP at 33.3 weeks Cat I FT GHTN HA   -POC Formulated. -Labs ordered. -Exam performed. -Discussed caffeine tablet for HA treatment considering severity and recent tylenol dosing. -Start IV and give fluids.  -Patient agreeable. -NST reactive   Maryann Conners MSN, CNM 05/22/2022, 11:38 AM   Reassessment (12:33 PM) -Results as above. -Dr. Elray Mcgregor consulted and informed of patient status, evaluation, interventions, and results. Advised: *No interventions at this time. *Patient to follow up as scheduled.  -Provider to bedside to discuss. -Patient reports improvement in HA. -Reviewed results. No questions or concerns. -Precautions reviewed. -Encouraged to call primary office or return to MAU if symptoms worsen or with the onset of new symptoms. -Discharged to home in stable condition.  Maryann Conners MSN, CNM Advanced Practice  Provider, [facility]

## 2022-05-22 NOTE — Progress Notes (Signed)
ROB   CC: Elevated B/P at home and HA today.

## 2022-05-22 NOTE — MAU Note (Signed)
Deborah Pierce is a 32 y.o. at 33w3dhere in MAU reporting: has a HA since last night that has not gone away with Tylenol.  Has had elevated BP for about 2 wks. Is currently on Procardia (dly- took last night). BP was up at office, because of HA, they sent her over for blood work. Denies visual changes, RUQ pain or increase in swelling. Denies Vag bleeding, LOF, reports +FM  Onset of complaint: last night Pain score: 5 Vitals:   05/22/22 1054  BP: (!) 148/86  Pulse: 97  Resp: 18      Lab orders placed from triage:  blood work drawn prior to triage

## 2022-05-22 NOTE — Progress Notes (Signed)
PRENATAL VISIT NOTE  Subjective:  Deborah Pierce is a 32 y.o. G2P0010 at 36w3dbeing seen today for ongoing prenatal care.  She is currently monitored for the following issues for this high-risk pregnancy and has Controlled type 2 diabetes mellitus without complication, with long-term current use of insulin ([place]; Diabetic peripheral neuropathy ([place]; Class 1 obesity due to excess calories with serious comorbidity and body mass index (BMI) of 34.0 to 34.9 in adult; Depression with anxiety; Supervision of high risk pregnancy, antepartum; Mixed hyperlipidemia; Vitamin D deficiency; Type 2 diabetes mellitus affecting pregnancy, antepartum; Obesity in pregnancy, antepartum, third trimester; Dysplastic nevus; BMI 37.0-37.9, adult; and Gestational hypertension on their problem list.  Patient reports no complaints.  Contractions: Not present. Vag. Bleeding: None.  Movement: Present. Denies leaking of fluid.   The following portions of the patient's history were reviewed and updated as appropriate: allergies, current medications, past family history, past medical history, past social history, past surgical history and problem list.   Objective:   Vitals:   05/22/22 0906  BP: (!) 148/89  Pulse: 89  Weight: 247 lb (112 kg)    Fetal Status: Fetal Heart Rate (bpm): 154   Movement: Present     General:  Alert, oriented and cooperative. Patient is in no acute distress.  Skin: Skin is warm and dry. No rash noted.   Cardiovascular: Normal heart rate noted  Respiratory: Normal respiratory effort, no problems with respiration noted  Abdomen: Soft, gravid, appropriate for gestational age.  Pain/Pressure: Absent     Pelvic: Cervical exam deferred        Extremities: Normal range of motion.  Edema: Trace  Mental Status: Normal mood and affect. Normal behavior. Normal judgment and thought content.   Assessment and Plan:  Pregnancy: G2P0010 at 358w3d. Gestational hypertension, third trimester Having  sustained HA without improvement with Tylenol 100063mt ~8AM Given elevated BP with severe feature recommend MAU evaluation  Reviewed at MAU they may try to treat HA and if unable to improve sx, she will likely be admitted for Mag and possible early delivery.  Patient voiced understanding and is headed to MAU by private vehicle.  MAU APP and Attending alerted by Epic Chat and phone  2. Controlled type 2 diabetes mellitus without complication, with long-term current use of insulin (HCC)   3. Obesity in pregnancy, antepartum, third trimester TWG=23 lb (10.4 kg)   4. Type 2 diabetes mellitus affecting pregnancy, antepartum 2/2 growth US [place] 2482gm 96th% and AC was > 99th% Evidence of LGA  5. Supervision of high risk pregnancy, antepartum Up to date   Preterm labor symptoms and general obstetric precautions including but not limited to vaginal bleeding, contractions, leaking of fluid and fetal movement were reviewed in detail with the patient. Please refer to After Visit Summary for other counseling recommendations.   No follow-ups on file.  Future Appointments  Date Time Provider DepCarolina/12/2022  3:30 PM [facility]/12/2022  3:45 PM [facility]MC-MFC US1 [facility]/03/2023  3:50 PM C[facility]-WSCA NURSE [facility]  05/28/2022  8:35 AM PicAletha HalimD [facility]  05/29/2022  2:30 PM [facility]URSE [facility]/16/2024  2:45 PM [facility]MC-MFC US4 [facility]/21/2024 11:15 AM PraDonnamae JudeD [facility]-W[facility]A [facility]  06/05/2022  3:15 PM WMC-MFC NURSE [facility]-MFC [facility]/23/2024  3:30 PM [facility]MC-MFC US3 [facility]/28/2024  1:30 PM PraDonnamae JudeD [facility]  06/12/2022  3:30 PM WMC-MFC  NURSE [facility]  06/12/2022  3:45 PM WMC-MFC US5 WMC-M[facility]  06/17/2022  1:30 PM Donnamae Jude, MD [facility]A [facility]  06/24/2022 11:15 AM Donnamae Jude, MD [facility]  07/01/2022 11:15 AM Donnamae Jude, MD [facility]   07/08/2022 11:15 AM Donnamae Jude, MD [facility]A [facility]  08/25/2022 10:30 AM Ralene Bathe, MD ASC-ASC None  09/01/2022 10:30 AM Ralene Bathe, MD ASC-[facility] None    Caren Macadam, MD

## 2022-05-25 ENCOUNTER — Ambulatory Visit: Payer: No Typology Code available for payment source

## 2022-05-28 ENCOUNTER — Encounter (HOSPITAL_COMMUNITY): Payer: Self-pay | Admitting: Obstetrics and Gynecology

## 2022-05-28 ENCOUNTER — Ambulatory Visit: Payer: No Typology Code available for payment source | Admitting: Obstetrics and Gynecology

## 2022-05-28 ENCOUNTER — Inpatient Hospital Stay (HOSPITAL_COMMUNITY)
Admission: AD | Admit: 2022-05-28 | Discharge: 2022-05-31 | DRG: 788 | Disposition: A | Discharge status: Home / Self Care | Payer: No Typology Code available for payment source | Attending: Obstetrics and Gynecology | Admitting: Obstetrics and Gynecology

## 2022-05-28 VITALS — BP 150/91 | HR 84 | Wt 253.0 lb

## 2022-05-28 DIAGNOSIS — O169 Unspecified maternal hypertension, unspecified trimester: Secondary | ICD-10-CM | POA: Diagnosis present

## 2022-05-28 DIAGNOSIS — Z6837 Body mass index (BMI) 37.0-37.9, adult: Secondary | ICD-10-CM

## 2022-05-28 DIAGNOSIS — E119 Type 2 diabetes mellitus without complications: Secondary | ICD-10-CM | POA: Diagnosis present

## 2022-05-28 DIAGNOSIS — Z3A34 34 weeks gestation of pregnancy: Secondary | ICD-10-CM

## 2022-05-28 DIAGNOSIS — O3663X0 Maternal care for excessive fetal growth, third trimester, not applicable or unspecified: Secondary | ICD-10-CM | POA: Diagnosis present

## 2022-05-28 DIAGNOSIS — D649 Anemia, unspecified: Secondary | ICD-10-CM | POA: Diagnosis not present

## 2022-05-28 DIAGNOSIS — O24119 Pre-existing diabetes mellitus, type 2, in pregnancy, unspecified trimester: Secondary | ICD-10-CM

## 2022-05-28 DIAGNOSIS — Z9641 Presence of insulin pump (external) (internal): Secondary | ICD-10-CM | POA: Diagnosis present

## 2022-05-28 DIAGNOSIS — O26893 Other specified pregnancy related conditions, third trimester: Secondary | ICD-10-CM | POA: Diagnosis not present

## 2022-05-28 DIAGNOSIS — O1414 Severe pre-eclampsia complicating childbirth: Principal | ICD-10-CM | POA: Diagnosis present

## 2022-05-28 DIAGNOSIS — O133 Gestational [pregnancy-induced] hypertension without significant proteinuria, third trimester: Secondary | ICD-10-CM

## 2022-05-28 DIAGNOSIS — O99214 Obesity complicating childbirth: Secondary | ICD-10-CM | POA: Diagnosis present

## 2022-05-28 DIAGNOSIS — Z794 Long term (current) use of insulin: Secondary | ICD-10-CM | POA: Diagnosis not present

## 2022-05-28 DIAGNOSIS — O0990 Supervision of high risk pregnancy, unspecified, unspecified trimester: Secondary | ICD-10-CM

## 2022-05-28 DIAGNOSIS — O24113 Pre-existing diabetes mellitus, type 2, in pregnancy, third trimester: Secondary | ICD-10-CM | POA: Diagnosis not present

## 2022-05-28 DIAGNOSIS — O2412 Pre-existing diabetes mellitus, type 2, in childbirth: Secondary | ICD-10-CM | POA: Diagnosis present

## 2022-05-28 DIAGNOSIS — R519 Headache, unspecified: Secondary | ICD-10-CM

## 2022-05-28 DIAGNOSIS — O321XX0 Maternal care for breech presentation, not applicable or unspecified: Secondary | ICD-10-CM | POA: Diagnosis present

## 2022-05-28 DIAGNOSIS — O163 Unspecified maternal hypertension, third trimester: Secondary | ICD-10-CM

## 2022-05-28 DIAGNOSIS — O99213 Obesity complicating pregnancy, third trimester: Secondary | ICD-10-CM

## 2022-05-28 DIAGNOSIS — O329XX0 Maternal care for malpresentation of fetus, unspecified, not applicable or unspecified: Secondary | ICD-10-CM

## 2022-05-28 DIAGNOSIS — O1413 Severe pre-eclampsia, third trimester: Secondary | ICD-10-CM | POA: Diagnosis present

## 2022-05-28 DIAGNOSIS — R030 Elevated blood-pressure reading, without diagnosis of hypertension: Secondary | ICD-10-CM | POA: Diagnosis present

## 2022-05-28 DIAGNOSIS — E1142 Type 2 diabetes mellitus with diabetic polyneuropathy: Secondary | ICD-10-CM | POA: Diagnosis present

## 2022-05-28 DIAGNOSIS — O9902 Anemia complicating childbirth: Secondary | ICD-10-CM | POA: Diagnosis not present

## 2022-05-28 HISTORY — DX: Depression, unspecified: F32.A

## 2022-05-28 HISTORY — DX: Urinary tract infection, site not specified: N39.0

## 2022-05-28 HISTORY — DX: Headache, unspecified: R51.9

## 2022-05-28 HISTORY — DX: Anxiety disorder, unspecified: F41.9

## 2022-05-28 LAB — COMPREHENSIVE METABOLIC PANEL
ALT: 19 U/L (ref 0–44)
AST: 20 U/L (ref 15–41)
Albumin: 2.5 g/dL — ABNORMAL LOW (ref 3.5–5.0)
Alkaline Phosphatase: 72 U/L (ref 38–126)
Anion gap: 11 (ref 5–15)
BUN: 13 mg/dL (ref 6–20)
CO2: 21 mmol/L — ABNORMAL LOW (ref 22–32)
Calcium: 8.6 mg/dL — ABNORMAL LOW (ref 8.9–10.3)
Chloride: 102 mmol/L (ref 98–111)
Creatinine, Ser: 0.61 mg/dL (ref 0.44–1.00)
GFR, Estimated: >60 mL/min (ref >60)
Glucose, Bld: 69 mg/dL — ABNORMAL LOW (ref 70–99)
Potassium: 4.2 mmol/L (ref 3.5–5.1)
Sodium: 134 mmol/L — ABNORMAL LOW (ref 135–145)
Total Bilirubin: 0.5 mg/dL (ref 0.3–1.2)
Total Protein: 6 g/dL — ABNORMAL LOW (ref 6.5–8.1)

## 2022-05-28 LAB — CBC
HCT: 36.6 % (ref 36.0–46.0)
Hemoglobin: 11.8 g/dL — ABNORMAL LOW (ref 12.0–15.0)
MCH: 26.8 pg (ref 26.0–34.0)
MCHC: 32.2 g/dL (ref 30.0–36.0)
MCV: 83.2 fL (ref 80.0–100.0)
Platelets: 342 10*3/uL (ref 150–400)
RBC: 4.4 MIL/uL (ref 3.87–5.11)
RDW: 14.4 % (ref 11.5–15.5)
WBC: 12.2 10*3/uL — ABNORMAL HIGH (ref 4.0–10.5)
nRBC: 0 % (ref 0.0–0.2)

## 2022-05-28 LAB — TYPE AND SCREEN
ABO/RH(D): O POS
Antibody Screen: NEGATIVE

## 2022-05-28 LAB — PROTEIN / CREATININE RATIO, URINE
Creatinine, Urine: 71 mg/dL
Protein Creatinine Ratio: 0.2 mg/mg{Cre} — ABNORMAL HIGH (ref 0.00–0.15)
Total Protein, Urine: 14 mg/dL

## 2022-05-28 LAB — GLUCOSE, CAPILLARY
Glucose-Capillary: 144 mg/dL — ABNORMAL HIGH (ref 70–99)
Glucose-Capillary: 134 mg/dL — ABNORMAL HIGH (ref 70–99)

## 2022-05-28 MED ORDER — DOCUSATE SODIUM 100 MG PO CAPS: 100.0000 mg | ORAL_CAPSULE | Freq: Every day | ORAL | Status: DC

## 2022-05-28 MED ORDER — DIPHENHYDRAMINE HCL 50 MG/ML IJ SOLN
25.0000 mg | Freq: Once | INTRAMUSCULAR | Status: AC
  Administered 2022-05-28: 25 mg via INTRAVENOUS
  Filled 2022-05-28: qty 1

## 2022-05-28 MED ORDER — ACETAMINOPHEN 500 MG PO TABS
1000.0000 mg | ORAL_TABLET | Freq: Once | ORAL | Status: AC
  Administered 2022-05-28: 1000 mg via ORAL
  Filled 2022-05-28: qty 2

## 2022-05-28 MED ORDER — PRENATAL PLUS 27-1 MG PO TABS: 1.0000 | ORAL_TABLET | Freq: Every day | ORAL | Status: DC

## 2022-05-28 MED ORDER — ACETAMINOPHEN 325 MG PO TABS
650.0000 mg | ORAL_TABLET | ORAL | Status: DC | PRN
  Administered 2022-05-28 – 2022-05-29 (×2): 650 mg via ORAL
  Filled 2022-05-28 (×2): qty 2

## 2022-05-28 MED ORDER — CALCIUM CARBONATE ANTACID 500 MG PO CHEW: 2.0000 | CHEWABLE_TABLET | ORAL | Status: DC | PRN

## 2022-05-28 MED ORDER — METOCLOPRAMIDE HCL 5 MG/ML IJ SOLN
10.0000 mg | Freq: Once | INTRAMUSCULAR | Status: AC
  Administered 2022-05-28: 10 mg via INTRAVENOUS
  Filled 2022-05-28: qty 2

## 2022-05-28 MED ORDER — PRENATAL MULTIVITAMIN CH: 1.0000 | ORAL_TABLET | Freq: Every day | ORAL | Status: DC

## 2022-05-28 MED ORDER — MAGNESIUM SULFATE 2 GM/50ML IV SOLN
2.0000 g | Freq: Once | INTRAVENOUS | Status: AC
  Administered 2022-05-28: 2 g via INTRAVENOUS
  Filled 2022-05-28: qty 50

## 2022-05-28 MED ORDER — LACTATED RINGERS IV SOLN
125.0000 mL/h | INTRAVENOUS | Status: AC
  Administered 2022-05-29: 125 mL/h via INTRAVENOUS

## 2022-05-28 MED ORDER — LACTATED RINGERS IV SOLN: INTRAVENOUS | Status: DC

## 2022-05-28 MED ORDER — NIFEDIPINE ER OSMOTIC RELEASE 30 MG PO TB24
30.0000 mg | ORAL_TABLET | Freq: Every day | ORAL | Status: DC
  Administered 2022-05-28: 30 mg via ORAL
  Filled 2022-05-28: qty 1

## 2022-05-28 MED ORDER — FLUOXETINE HCL 20 MG PO CAPS
40.0000 mg | ORAL_CAPSULE | Freq: Every day | ORAL | Status: DC
  Administered 2022-05-28: 40 mg via ORAL
  Filled 2022-05-28 (×2): qty 2

## 2022-05-28 NOTE — MAU Provider Note (Signed)
History     [id number]  Arrival date and time: 05/28/22 [id number]    Chief Complaint  Patient presents with   Headache   Hypertension     HPI Deborah Pierce is a 32 y.o. at 76w2dwho presents for hypertension & headache. Patient was diagnosed with gestational hypertension last week.  Has had severe range blood pressures at home this week and in the office this morning.  Reports frontal headache that she rates 6/10 all week.  Has not treated headache.  Denies visual disturbance or epigastric pain.  Was sent from the office for further evaluation.  Denies contractions, leaking of fluid, vaginal bleeding.  Reports good fetal movement.   OB History     Gravida  2   Para  0   Term  0   Preterm      AB  1   Living  0      SAB  1   IAB      Ectopic      Multiple      Live Births  0        Obstetric Comments  March 2022 spontaneous abortion/miscarriage         Past Medical History:  Diagnosis Date   Anxiety    Depression    Diabetes mellitus without complication (HP[place]    Dysplastic nevus 02/20/2022   Severe, R to mid upper back paraspinal, needs excision   Dysplastic nevus 02/20/2022   Severe, L mid back paraspinal, needs excision   Dysplastic nevus 02/20/2022   mild, L lateral buttocks   Fractured elbow 2022   rt   Headache    Other fatigue 08/12/2021   Pregnancy induced hypertension 05/11/2022   Pt states they just noticed about a week ago.   Seasonal allergies 08/07/2011   UTI (urinary tract infection)     Past Surgical History:  Procedure Laterality Date   LAPAROSCOPY     looking for endometriosis    Family History  Problem Relation Age of Onset   Hypertension Mother    Heart disease Mother    Cancer Mother        melanoma   Diabetes Mother    Diabetes type II Mother    Melanoma Mother    Diabetes Sister    Heart disease Maternal Grandmother    Hypertension Maternal Grandmother    Diabetes Maternal Grandmother    Cancer Maternal  Grandfather    Lung cancer Maternal Grandfather    Brain cancer Maternal Grandfather    Asthma Neg Hx     Allergies  Allergen Reactions   Fruit & Vegetable Daily [Black Cohosh-Soyisoflav-C Quad] Anaphylaxis    Apples, oraganges, kiwi, peaches   Penicillins Anaphylaxis    swelling   Insulins Nausea Only and Rash    Insulin glargine, degludec, aspart     Tree Extract Itching   Bee Venom Rash    unknown   Insulin Degludec-Liraglutide Rash   Insulin Glargine-Lixisenatide Rash   Sulfa Antibiotics Rash    No current facility-administered medications on file prior to encounter.   Current Outpatient Medications on File Prior to Encounter  Medication Sig Dispense Refill   acetaminophen (TYLENOL) 500 MG tablet Take 1,000 mg by mouth every 6 (six) hours as needed.     aspirin 81 MG chewable tablet Chew 1 tablet (81 mg total) by mouth daily. 90 tablet 2   Cetirizine HCl (ZYRTEC ALLERGY PO) Take by mouth.     cholecalciferol (VITAMIN D3) 25 MCG (  1000 UNIT) tablet Take 6,000 Units by mouth daily.     FLUoxetine (PROZAC) 40 MG capsule Take 1 capsule (40 mg total) by mouth daily. 90 capsule 1   insulin aspart (NOVOLOG) 100 UNIT/ML FlexPen Sliding scale insulin Less than 70 initiate hypoglycemia protocol 70-120  0 units 120-150 2 units 151-200 3 units 201-250 5units 251-300 8 units 301-350 11 units 351-400 15 units Greater than 400 call MD 15 mL 11   insulin degludec (TRESIBA FLEXTOUCH) 200 UNIT/ML FlexTouch Pen Inject 40 Units into the skin at bedtime.     metFORMIN (GLUCOPHAGE) 1000 MG tablet Take 1 tablet (1,000 mg total) by mouth 2 (two) times daily with a meal. 180 tablet 1   NIFEdipine (PROCARDIA-XL/NIFEDICAL-XL) 30 MG 24 hr tablet Take 1 tablet (30 mg total) by mouth daily. 30 tablet 11   prenatal vitamin w/FE, FA (PRENATAL 1 + 1) 27-1 MG TABS tablet Take 1 tablet by mouth daily at 12 noon.     blood glucose meter kit and supplies KIT Dispense based on patient and insurance preference.  Use up to four times daily as directed. (FOR ICD-9 250.00, 250.01). 1 each 0   Continuous Blood Gluc Receiver (DEXCOM G6 RECEIVER) DEVI ONE EACH BY DOES NOT APPLY ROUTE ONCE FOR 1 DOSE.     Continuous Blood Gluc Sensor (DEXCOM G6 SENSOR) MISC SMARTSIG:1 Each Topical Every 10 Days     hydrOXYzine (ATARAX/VISTARIL) 25 MG tablet Take 25 mg by mouth daily as needed for anxiety or itching.     Insulin Syringe-Needle U-100 (INSULIN SYRINGE .5CC/31GX5/16") 31G X 5/16" 0.5 ML MISC Use as directed 100 each 0   ondansetron (ZOFRAN-ODT) 4 MG disintegrating tablet Take 1 tablet (4 mg total) by mouth every 8 (eight) hours as needed for nausea. 42 tablet 2     ROS Pertinent positives and negative per HPI, all others reviewed and negative  Physical Exam   BP (!) 151/85   Pulse 100   Temp 98.5 F (36.9 C) (Oral)   Resp 16   Ht 5' 7"$  (1.702 m)   Wt 115.8 kg   LMP 09/22/2021   SpO2 96%   BMI 40.00 kg/m   Patient Vitals for the past 24 hrs:  BP Temp Temp src Pulse Resp SpO2 Height Weight  05/28/22 1500 (!) 151/85 -- -- 100 -- -- -- --  05/28/22 1445 (!) 152/94 -- -- (!) 102 -- -- -- --  05/28/22 1415 (!) 147/80 -- -- (!) 108 16 -- -- --  05/28/22 1414 -- -- -- -- -- 96 % -- --  05/28/22 1400 (!) 145/79 -- -- (!) 118 -- -- -- --  05/28/22 1352 (!) 148/82 -- -- (!) 103 -- -- -- --  05/28/22 1315 (!) 156/91 -- -- (!) 103 16 96 % -- --  05/28/22 1311 (!) 149/93 -- -- (!) 103 -- 97 % -- --  05/28/22 1300 (!) 150/77 -- -- 97 16 96 % -- --  05/28/22 1230 (!) 150/99 -- -- (!) 102 -- 97 % -- --  05/28/22 1215 (!) 152/95 -- -- 96 -- 97 % -- --  05/28/22 1200 139/87 -- -- 95 -- 97 % -- --  05/28/22 1145 (!) 146/92 -- -- 98 -- 97 % -- --  05/28/22 1130 (!) 155/98 -- -- 96 -- 96 % -- --  05/28/22 1115 (!) 149/95 -- -- 94 -- 97 % -- --  05/28/22 1100 (!) 147/90 -- -- 90 -- -- -- --  05/28/22 1045 (!) 152/91 -- -- 94 -- -- -- --  05/28/22 1030 (!) 157/92 -- -- 89 -- -- -- --  05/28/22 1022 (!) 155/89  98.5 F (36.9 C) Oral 88 18 -- -- --  05/28/22 1019 -- -- -- -- -- 98 % -- --  05/28/22 1001 -- -- -- -- -- -- 5' 7"$  (1.702 m) 115.8 kg    Physical Exam Vitals and nursing note reviewed.  Constitutional:      General: She is not in acute distress.    Appearance: She is well-developed.  HENT:     Head: Normocephalic and atraumatic.  Cardiovascular:     Rate and Rhythm: Normal rate and regular rhythm.     Heart sounds: Normal heart sounds.  Pulmonary:     Effort: Pulmonary effort is normal. No respiratory distress.     Breath sounds: Normal breath sounds.  Musculoskeletal:     Cervical back: Normal range of motion. No rigidity.     Right lower leg: 1+ Pitting Edema present.     Left lower leg: 1+ Pitting Edema present.  Skin:    General: Skin is warm and dry.  Neurological:     Mental Status: She is alert.     Deep Tendon Reflexes:     Reflex Scores:      Patellar reflexes are 1+ on the right side.    Comments: No clonus       FHT Baseline 145, moderate variability, 15x15 accels, no decels Toco: irregular Cat: 1  Labs Results for orders placed or performed during the hospital encounter of 05/28/22 (from the past 24 hour(s))  Type and screen [place]     Status: None (Preliminary result)   Collection Time: 05/28/22 10:06 AM  Result Value Ref Range   ABO/RH(D) PENDING    Antibody Screen PENDING    Sample Expiration      05/31/2022,2359 Performed at [facility], [place] [address], [place], [place] [address]   CBC     Status: Abnormal   Collection Time: 05/28/22 10:09 AM  Result Value Ref Range   WBC 12.2 (H) 4.0 - 10.5 K/uL   RBC 4.40 3.87 - 5.11 MIL/uL   Hemoglobin 11.8 (L) 12.0 - 15.0 g/dL   HCT 36.6 36.0 - 46.0 %   MCV 83.2 80.0 - 100.0 fL   MCH 26.8 26.0 - 34.0 pg   MCHC 32.2 30.0 - 36.0 g/dL   RDW 14.4 11.5 - 15.5 %   Platelets 342 150 - 400 K/uL   nRBC 0.0 0.0 - 0.2 %  Comprehensive metabolic panel     Status: Abnormal    Collection Time: 05/28/22 10:09 AM  Result Value Ref Range   Sodium 134 (L) 135 - 145 mmol/L   Potassium 4.2 3.5 - 5.1 mmol/L   Chloride 102 98 - 111 mmol/L   CO2 21 (L) 22 - 32 mmol/L   Glucose, Bld 69 (L) 70 - 99 mg/dL   BUN 13 6 - 20 mg/dL   Creatinine, Ser 0.61 0.44 - 1.00 mg/dL   Calcium 8.6 (L) 8.9 - 10.3 mg/dL   Total Protein 6.0 (L) 6.5 - 8.1 g/dL   Albumin 2.5 (L) 3.5 - 5.0 g/dL   AST 20 15 - 41 U/L   ALT 19 0 - 44 U/L   Alkaline Phosphatase 72 38 - 126 U/L   Total Bilirubin 0.5 0.3 - 1.2 mg/dL   GFR, Estimated >60 >60 mL/min  Anion gap 11 5 - 15  Protein / creatinine ratio, urine     Status: Abnormal   Collection Time: 05/28/22 10:25 AM  Result Value Ref Range   Creatinine, Urine 71 mg/dL   Total Protein, Urine 14 mg/dL   Protein Creatinine Ratio 0.20 (H) 0.00 - 0.15 mg/mg[Cre]    Imaging No results found.  MAU Course  Procedures Lab Orders         CBC         Comprehensive metabolic panel         Protein / creatinine ratio, urine    Meds ordered this encounter  Medications   acetaminophen (TYLENOL) tablet 1,000 mg   lactated ringers infusion   diphenhydrAMINE (BENADRYL) injection 25 mg   metoCLOPramide (REGLAN) injection 10 mg   magnesium sulfate IVPB 2 g 50 mL   lactated ringers infusion   acetaminophen (TYLENOL) tablet 650 mg   docusate sodium (COLACE) capsule 100 mg   calcium carbonate (TUMS - dosed in mg elemental calcium) chewable tablet 400 mg of elemental calcium   prenatal multivitamin tablet 1 tablet   Imaging Orders  No imaging studies ordered today    MDM Patient sent from office for evaluation of preeclampsia.  Severe range blood pressures in MAU and labs normal.  Headache resistant to Tylenol, magnesium 2 g bolus, Benadryl, and Reglan.  She had minimal relief down to 4/10 after IV meds. Consulted with Dr. Mikel Cella, she spoke with patient and will hobs due to patient's severe range blood pressures at home and persistent  headache. Assessment and Plan   1. Gestational hypertension, third trimester   2. Headache in pregnancy, antepartum, third trimester   3. [redacted] weeks gestation of pregnancy    -Admit to [facility] unit per Dr. Mervyn Skeeters, NP 05/28/22 3:42 PM

## 2022-05-28 NOTE — MAU Note (Signed)
Deborah Pierce is a 32 y.o. at 27w2dhere in MAU reporting: sent in for further eval, elevated BP and HA.  Pt did not take anything for HA. Denies bleeding or LOF, reports +FM.  Onset of complaint: this morning Pain score: 6 There were no vitals filed for this visit.    Lab orders placed from triage:  placed by APP

## 2022-05-28 NOTE — H&P (Signed)
FACULTY PRACTICE ANTEPARTUM ADMISSION HISTORY AND PHYSICAL NOTE  History of Present Illness: Deborah Pierce is a 32 y.o. G2P0010 at 24w2dadmitted for hypertension in pregnancy.   Presented today from the office with elevated blood pressures and headache. Patient was diagnosed with gestational hypertension ten days ago and was started on procardia XL 326mdaily.  Has had severe range blood pressures at home this week and in the office this morning.  Reports frontal headache that she rates 6/10 all week.  Had not treated headache. Reports that this is similar to headaches that she has gotten throughout pregnancy and outside of pregnancy. Denies visual disturbance or epigastric pain. Denies contractions, leaking of fluid, vaginal bleeding.  Reports good fetal movement.   In [facility], pt had BP of 140-150s/70-90s. Labs overall reassuring w/ P/C 0.2, Cr 0.61, ALT 19, AST 20, plt 342. Headache improved but did not completely resolve with tylenol, reglan, benadryl, and magnesium.   Fetal presentation is breech. (As of 2/9) @32$ /3: 2481g (96%), AC >99%, AFI 21.26, breech, posterior  Patient Active Problem List   Diagnosis Date Noted   Excessive fetal growth affecting management of mother in third trimester, antepartum 05/28/2022   Hypertension affecting pregnancy 05/28/2022   Gestational hypertension 05/18/2022   Dysplastic nevus 04/16/2022   BMI 37.0-37.9, adult 04/16/2022   Obesity in pregnancy, antepartum, third trimester 02/19/2022   Mixed hyperlipidemia 12/17/2021   Vitamin D deficiency 12/17/2021   Type 2 diabetes mellitus affecting pregnancy, antepartum 12/17/2021   Supervision of high risk pregnancy, antepartum 11/25/2021   Controlled type 2 diabetes mellitus without complication, with long-term current use of insulin ([place]05/05/2021   Diabetic peripheral neuropathy ([place]05/05/2021   Depression with anxiety 08/12/2021   Past Medical History:  Diagnosis Date   Anxiety    Depression     Diabetes mellitus without complication ([place]   Dysplastic nevus 02/20/2022   Severe, R to mid upper back paraspinal, needs excision   Dysplastic nevus 02/20/2022   Severe, L mid back paraspinal, needs excision   Dysplastic nevus 02/20/2022   mild, L lateral buttocks   Fractured elbow 2022   rt   Headache    Other fatigue 08/12/2021   Pregnancy induced hypertension 05/11/2022   Pt states they just noticed about a week ago.   Seasonal allergies 08/07/2011   UTI (urinary tract infection)     Past Surgical History:  Procedure Laterality Date   LAPAROSCOPY     looking for endometriosis    OB History  Gravida Para Term Preterm AB Living  2 0 0   1 0  SAB IAB Ectopic Multiple Live Births  1       0    # Outcome Date GA Lbr Len/2nd Weight Sex Delivery Anes PTL Lv  2 Current           1 SAB 06/2020            Obstetric Comments  March 2022 spontaneous abortion/miscarriage    Social History   Socioeconomic History   Marital status: Single    Spouse name: Not on file   Number of children: 0   Years of education: Not on file   Highest education level: High school graduate  Occupational History   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  Tobacco Use   Smoking status: Never   Smokeless tobacco: Never  Vaping Use   Vaping Use: Never used  Substance and Sexual Activity   Alcohol use: Not Currently   Drug use:  Not Currently    Types: Marijuana    Comment: daily with anxiety; but not since preg   Sexual activity: Yes    Partners: Male    Birth control/protection: None  Other Topics Concern   Not on file  Social History Narrative   Not on file   Social Determinants of Health   Financial Resource Strain: Not on file  Food Insecurity: Not on file  Transportation Needs: Not on file  Physical Activity: Not on file  Stress: Not on file  Social Connections: Not on file    Family History  Problem Relation Age of Onset   Hypertension Mother    Heart disease Mother     Cancer Mother        melanoma   Diabetes Mother    Diabetes type II Mother    Melanoma Mother    Diabetes Sister    Heart disease Maternal Grandmother    Hypertension Maternal Grandmother    Diabetes Maternal Grandmother    Cancer Maternal Grandfather    Lung cancer Maternal Grandfather    Brain cancer Maternal Grandfather    Asthma Neg Hx     Allergies  Allergen Reactions   Fruit Extracts Anaphylaxis    Apples, oranges, kiwi, peaches, plums and carrots   Penicillins Anaphylaxis    swelling   Insulins Nausea Only and Rash    Insulin glargine, degludec, aspart     Tree Extract Itching   Bee Venom Rash    unknown   Insulin Degludec-Liraglutide Rash   Insulin Glargine-Lixisenatide Rash   Sulfa Antibiotics Rash    Medications Prior to Admission  Medication Sig Dispense Refill Last Dose   acetaminophen (TYLENOL) 500 MG tablet Take 1,000 mg by mouth every 6 (six) hours as needed.   05/27/2022   aspirin 81 MG chewable tablet Chew 1 tablet (81 mg total) by mouth daily. 90 tablet 2 05/27/2022   Cetirizine HCl (ZYRTEC ALLERGY PO) Take by mouth.   05/27/2022   cholecalciferol (VITAMIN D3) 25 MCG (1000 UNIT) tablet Take 6,000 Units by mouth daily.   05/27/2022   FLUoxetine (PROZAC) 40 MG capsule Take 1 capsule (40 mg total) by mouth daily. 90 capsule 1 05/27/2022   insulin aspart (NOVOLOG) 100 UNIT/ML FlexPen Sliding scale insulin Less than 70 initiate hypoglycemia protocol 70-120  0 units 120-150 2 units 151-200 3 units 201-250 5units 251-300 8 units 301-350 11 units 351-400 15 units Greater than 400 call MD 15 mL 11 05/27/2022   insulin degludec (TRESIBA FLEXTOUCH) 200 UNIT/ML FlexTouch Pen Inject 40 Units into the skin at bedtime.   05/27/2022   metFORMIN (GLUCOPHAGE) 1000 MG tablet Take 1 tablet (1,000 mg total) by mouth 2 (two) times daily with a meal. 180 tablet 1 05/27/2022   NIFEdipine (PROCARDIA-XL/NIFEDICAL-XL) 30 MG 24 hr tablet Take 1 tablet (30 mg total) by mouth daily. 30 tablet  11 05/27/2022   prenatal vitamin w/FE, FA (PRENATAL 1 + 1) 27-1 MG TABS tablet Take 1 tablet by mouth daily at 12 noon.   05/27/2022   blood glucose meter kit and supplies KIT Dispense based on patient and insurance preference. Use up to four times daily as directed. (FOR ICD-9 250.00, 250.01). 1 each 0    Continuous Blood Gluc Receiver (DEXCOM G6 RECEIVER) DEVI ONE EACH BY DOES NOT APPLY ROUTE ONCE FOR 1 DOSE.      Continuous Blood Gluc Sensor (DEXCOM G6 SENSOR) MISC SMARTSIG:1 Each Topical Every 10 Days      hydrOXYzine (  ATARAX/VISTARIL) 25 MG tablet Take 25 mg by mouth daily as needed for anxiety or itching.   More than a month   Insulin Syringe-Needle U-100 (INSULIN SYRINGE .5CC/31GX5/16") 31G X 5/16" 0.5 ML MISC Use as directed 100 each 0    ondansetron (ZOFRAN-ODT) 4 MG disintegrating tablet Take 1 tablet (4 mg total) by mouth every 8 (eight) hours as needed for nausea. 42 tablet 2 More than a month   Review of Systems -  per HPI  Vitals:  BP (!) 154/84   Pulse (!) 108   Temp 98 F (36.7 C) (Oral)   Resp 16   Ht 5' 7"$  (1.702 m)   Wt 115.8 kg   LMP 09/22/2021   SpO2 98%   BMI 40.00 kg/m  Physical Examination: CONSTITUTIONAL: Well-developed, well-nourished female in no acute distress.  HENT:  Normocephalic, atraumatic EYES: Conjunctivae normal. Pupils are equal, round.  SKIN: Skin is warm and dry. No rash noted. Not diaphoretic. No erythema. No pallor. NEUROLOGIC: Alert and oriented to person, place, and time.  PSYCHIATRIC: Normal mood and affect. Normal behavior. Normal judgment and thought content. CARDIOVASCULAR: Normal heart rate noted, regular rhythm RESPIRATORY: Effort normal, no problems with respiration noted ABDOMEN: Soft, nontender, nondistended, gravid  Cervix: deferred Membranes:intact Fetal Monitoring:Baseline: 140 bpm, Variability: Good {> 6 bpm), Accelerations: Reactive, and Decelerations: Absent Tocometer: Flat  Labs:  Results for orders placed or performed  during the hospital encounter of 05/28/22 (from the past 24 hour(s))  Type and screen [place]   Collection Time: 05/28/22 10:06 AM  Result Value Ref Range   ABO/RH(D) O POS    Antibody Screen NEG    Sample Expiration      05/31/2022,2359 Performed at [facility], [place] [address], [place], [place] [address]   CBC   Collection Time: 05/28/22 10:09 AM  Result Value Ref Range   WBC 12.2 (H) 4.0 - 10.5 K/uL   RBC 4.40 3.87 - 5.11 MIL/uL   Hemoglobin 11.8 (L) 12.0 - 15.0 g/dL   HCT 36.6 36.0 - 46.0 %   MCV 83.2 80.0 - 100.0 fL   MCH 26.8 26.0 - 34.0 pg   MCHC 32.2 30.0 - 36.0 g/dL   RDW 14.4 11.5 - 15.5 %   Platelets 342 150 - 400 K/uL   nRBC 0.0 0.0 - 0.2 %  Comprehensive metabolic panel   Collection Time: 05/28/22 10:09 AM  Result Value Ref Range   Sodium 134 (L) 135 - 145 mmol/L   Potassium 4.2 3.5 - 5.1 mmol/L   Chloride 102 98 - 111 mmol/L   CO2 21 (L) 22 - 32 mmol/L   Glucose, Bld 69 (L) 70 - 99 mg/dL   BUN 13 6 - 20 mg/dL   Creatinine, Ser 0.61 0.44 - 1.00 mg/dL   Calcium 8.6 (L) 8.9 - 10.3 mg/dL   Total Protein 6.0 (L) 6.5 - 8.1 g/dL   Albumin 2.5 (L) 3.5 - 5.0 g/dL   AST 20 15 - 41 U/L   ALT 19 0 - 44 U/L   Alkaline Phosphatase 72 38 - 126 U/L   Total Bilirubin 0.5 0.3 - 1.2 mg/dL   GFR, Estimated >60 >60 mL/min   Anion gap 11 5 - 15  Protein / creatinine ratio, urine   Collection Time: 05/28/22 10:25 AM  Result Value Ref Range   Creatinine, Urine 71 mg/dL   Total Protein, Urine 14 mg/dL   Protein Creatinine Ratio 0.20 (H) 0.00 - 0.15  mg/mg[Cre]  Glucose, capillary   Collection Time: 05/28/22  4:11 PM  Result Value Ref Range   Glucose-Capillary 144 (H) 70 - 99 mg/dL    Imaging Studies: [place] MFM FETAL BPP WO NON STRESS  Result Date: 05/22/2022 ----------------------------------------------------------------------  OBSTETRICS REPORT                       (Signed Final 05/22/2022 04:15 pm)  ---------------------------------------------------------------------- Patient Info  ID #:       [id number]                          D.O.B.:  [birth year] (31 yrs)  Name:       Deborah Pierce                   Visit Date: 05/22/2022 03:36 pm ---------------------------------------------------------------------- Performed By  Attending:        Tama High MD        Ref. Address:     [place]  Performed By:     Rolm Bookbinder RDMS     [facility]                                                             [place] for                                                             Women  Referred By:      [facility] ---------------------------------------------------------------------- Orders  #  Description                           Code        Ordered By  1  [place] MFM FETAL BPP WO NON               [id number]    [place] ----------------------------------------------------------------------  #  Order #                     Accession #                Episode #  1  [id number]  [id number]                 [id number] ---------------------------------------------------------------------- Indications  Large for gestational age fetus affecting      O49.60X0  management of mother  Pre-existing diabetes, type 2, in pregnancy,   O24.113  third trimester (insulin, metformin)  [redacted] weeks gestation of pregnancy                123456  Obesity complicating pregnancy, third          O99.213  trimester (BMI 34)  LR NIPS/Neg Horizon/Neg AFP  Gestational hypertension without significant   O13.3  proteinuria, third trimester ---------------------------------------------------------------------- Vital Signs                            Pulse:  95  BP:          171/83 ---------------------------------------------------------------------- Fetal  Evaluation  Num Of Fetuses:         1  Fetal Heart Rate(bpm):  146  Cardiac Activity:       Observed  Presentation:           Breech  Placenta:               Posterior Fundal  P. Cord Insertion:      Previously visualized  Amniotic Fluid  AFI FV:      Within normal limits  AFI Sum(cm)     %Tile       Largest Pocket(cm)  12.86           40          8.43  RUQ(cm)       RLQ(cm)       LUQ(cm)        LLQ(cm)  8.43          2.14          2.29           0 ---------------------------------------------------------------------- Biophysical Evaluation  Amniotic F.V:   Within normal limits       F. Tone:        Observed  F. Movement:    Observed                   Score:          8/8  F. Breathing:   Observed ---------------------------------------------------------------------- OB History  Gravidity:    2         Term:   0        Prem:   0        SAB:   1  TOP:          0       Ectopic:  0        Living: 0 ---------------------------------------------------------------------- Gestational Age  LMP:           34w 4d        Date:  09/22/21                  EDD:   06/29/22  Best:          33w 3d     Det. By:  U/S C R L  (11/25/21)    EDD:   07/07/22 ---------------------------------------------------------------------- Anatomy  Cranium:               Appears normal         Kidneys:  Appear normal  Heart:                 Previously seen        Bladder:                Appears normal  Stomach:               Appears normal, left                         sided  Other:  All anatomy previously seen ---------------------------------------------------------------------- Impression  Gestational hypertension.  Blood pressures today at our  office were 171/83 and 153/74 mmHg.  Patient reports mild  headache but no blurry vision or epigastric pain or vaginal  bleeding.  Type 2 diabetes.  Patient is on insulin pump and reports her  blood glucose levels are mostly within normal range.  Amniotic fluid is normal and good fetal activity  seen.  Breech  presentation.  Antenatal testing is reassuring.  BPP 8/8. ---------------------------------------------------------------------- Recommendations  -Continue weekly BPP till delivery.  -Delivery at [redacted] weeks gestation. ----------------------------------------------------------------------                 Tama High, MD Electronically Signed Final Report   05/22/2022 04:15 pm ----------------------------------------------------------------------  [place] MFM OB FOLLOW UP  Result Date: 05/15/2022 ----------------------------------------------------------------------  OBSTETRICS REPORT                       (Signed Final 05/15/2022 05:18 pm) ---------------------------------------------------------------------- Patient Info  ID #:       [id number]                          D.O.B.:  12/23/1990 (31 yrs)  Name:       Deborah Pierce                   Visit Date: 05/15/2022 03:28 pm ---------------------------------------------------------------------- Performed By  Attending:        Valeda Malm DO       Ref. Address:     [place]  Performed By:     Nathen May       Location[facility]         [facility]                    RDMS                                     [facility] at                                                             [place] for  Women  Referred By:      [place] ---------------------------------------------------------------------- Orders  #  Description                           Code        Ordered By  1  [place] MFM OB FOLLOW UP                   [phone]    Valeda Malm  2  [place] MFM FETAL BPP WO NON               76819.01    [facility]     STRESS ----------------------------------------------------------------------  #  Order #                     Accession #                Episode #  1  [id number]                   [id number]                 [id number]  2   [id number]                   [id number]                 [id number] ---------------------------------------------------------------------- Indications  Large for gestational age fetus affecting      O42.60X0  management of mother  Pre-existing diabetes, type 2, in pregnancy,   O24.113  third trimester (insulin, metformin)  Obesity complicating pregnancy, third          O99.213  trimester (BMI 34)  Encounter for other antenatal screening        Z36.2  follow-up  [redacted] weeks gestation of pregnancy                Z3A.32  LR NIPS/Neg Horizon/Neg AFP ---------------------------------------------------------------------- Vital Signs  BP:          144/74 ---------------------------------------------------------------------- Fetal Evaluation  Num Of Fetuses:         1  Fetal Heart Rate(bpm):  143  Cardiac Activity:       Observed  Presentation:           Breech  Placenta:               Posterior  P. Cord Insertion:      Previously visualized  Amniotic Fluid  AFI FV:      Within normal limits  AFI Sum(cm)     %Tile       Largest Pocket(cm)  21.26           81          7.39  RUQ(cm)       RLQ(cm)       LUQ(cm)        LLQ(cm)  7.39          5.26          5.26           3.35 ---------------------------------------------------------------------- Biophysical Evaluation  Amniotic F.V:   Pocket => 2 cm             F. Tone:        Observed  F. Movement:    Observed                   Score:  8/8  F. Breathing:   Observed ---------------------------------------------------------------------- Biometry  BPD:      83.1  mm     G. Age:  33w 3d         50  %    CI:        75.71   %    70 - 86                                                          FL/HC:      20.1   %    19.1 - 21.3  HC:      302.8  mm     G. Age:  33w 4d         44  %    HC/AC:      0.93        0.96 - 1.17  AC:       326   mm     G. Age:  36w 4d       > 99  %    FL/BPD:     73.4   %    71 - 87  FL:         61  mm     G. Age:  31w 5d         19  %    FL/AC:      18.7    %    20 - 24  Est. FW:    2481  gm      5 lb 8 oz     96  % ---------------------------------------------------------------------- OB History  Gravidity:    2         Term:   0        Prem:   0        SAB:   1  TOP:          0       Ectopic:  0        Living: 0 ---------------------------------------------------------------------- Gestational Age  LMP:           33w 4d        Date:  09/22/21                  EDD:   06/29/22  U/S Today:     33w 6d                                        EDD:   06/27/22  Best:          Milderd Meager 3d     Det. By:  U/S C R L  (11/25/21)    EDD:   07/07/22 ---------------------------------------------------------------------- Anatomy  Cranium:               Appears normal         LVOT:                   Previously seen  Cavum:                 Appears normal         Aortic Arch:  Previously seen  Ventricles:            Previously seen        Ductal Arch:            Previously seen  Choroid Plexus:        Previously seen        Diaphragm:              Appears normal  Cerebellum:            Previously seen        Stomach:                Appears normal, left                                                                        sided  Posterior Fossa:       Previously seen        Abdomen:                Appears normal  Nuchal Fold:           Previously seen        Abdominal Wall:         Previously seen  Face:                  Orbits and profile     Cord Vessels:           Previously seen                         previously seen  Lips:                  Previously seen        Kidneys:                Appear normal  Palate:                Not well visualized    Bladder:                Appears normal  Thoracic:              Previously seen        Spine:                  Limited views prev                                                                        seen  Heart:                 Previously seen        Upper Extremities:      Previously seen  RVOT:                  Previously seen         Lower Extremities:      Previously  seen  Other:  Female gender previously seen. Lenses, Feet and Hands, Rt 5th, 3VV          previously visualized. Technically difficult due to maternal habitus and          fetal position. ---------------------------------------------------------------------- Comments  The patient is here for a BPP for DM2. She is at [place]. EDD  of 07/07/2022 dated by: U/S C R L  (11/25/21). She has no  concerns today other than continued difficulty with her blood  glucose (fasting 70s to 120s and PP 140s to 190s). Her BP  has been increasing over the past weeks. She denies  headaches or vision changes. She has a home BP cuff and  knows when to go to the hospital for preeclampsia r/o.  Sonographic findings  Single intrauterine pregnancy.  Fetal cardiac activity: Observed.  Presentation: Breech.  Interval fetal anatomy appears normal.  Fetal biometry shows the estimated fetal weight at the 96  percentile.  Amniotic fluid volume: Within normal limits. AFI: 21.26 cm.  MVP: 7.39 cm.  Placenta: Posterior.  BPP: 8/8.  Recommendations  1. BBPs weekly until delivery  2. Growth ultrasounds every 4 weeks until delivery  3. Delivery around 37 weeks or sooner if indicated  4. Home BP and glucose parameters given on when to come  to the hospital. Fetal movement precautions given. ----------------------------------------------------------------------                 Valeda Malm, DO Electronically Signed Final Report   05/15/2022 05:18 pm ----------------------------------------------------------------------  [place] MFM FETAL BPP WO NON STRESS  Result Date: 05/15/2022 ----------------------------------------------------------------------  OBSTETRICS REPORT                       (Signed Final 05/15/2022 05:18 pm) ---------------------------------------------------------------------- Patient Info  ID #:       [id number]                          D.O.B.:  1991/02/26 (31 yrs)  Name:       Deborah Pierce                    Visit Date: 05/15/2022 03:28 pm ---------------------------------------------------------------------- Performed By  Attending:        Valeda Malm DO       Ref. Address:     [place]  Performed By:     Nathen May       [facility]         [facility]                    RD[facility]                                     [facility] at  [facility] for                                                             Women  Referred By:      [facility] ---------------------------------------------------------------------- Orders  #  Description                           Code        Ordered By  1  [place] MFM OB FOLLOW UP                   [phone]    Valeda Malm  2  [place] MFM FETAL BPP WO NON               76819.01    [facility]     STRESS ----------------------------------------------------------------------  #  Order #                     Accession #                Episode #  1  [id number]                   [id number]                 [id number]  2  [id number]                   [id number]                 [id number] ---------------------------------------------------------------------- Indications  Large for gestational age fetus affecting      O64.60X0  management of mother  Pre-existing diabetes, type 2, in pregnancy,   O24.113  third trimester (insulin, metformin)  Obesity complicating pregnancy, third          O99.213  trimester (BMI 34)  Encounter for other antenatal screening        Z36.2  follow-up  [redacted] weeks gestation of pregnancy                Z3A.32  LR NIPS/Neg Horizon/Neg AFP ---------------------------------------------------------------------- Vital Signs  BP:          144/74 ---------------------------------------------------------------------- Fetal Evaluation  Num Of Fetuses:         1  Fetal Heart Rate(bpm):  143  Cardiac Activity:       Observed  Presentation:           Breech   Placenta:               Posterior  P. Cord Insertion:      Previously visualized  Amniotic Fluid  AFI FV:      Within normal limits  AFI Sum(cm)     %Tile       Largest Pocket(cm)  21.26           81          7.39  RUQ(cm)       RLQ(cm)       LUQ(cm)        LLQ(cm)  7.39          5.26          5.26  3.35 ---------------------------------------------------------------------- Biophysical Evaluation  Amniotic F.V:   Pocket => 2 cm             F. Tone:        Observed  F. Movement:    Observed                   Score:          8/8  F. Breathing:   Observed ---------------------------------------------------------------------- Biometry  BPD:      83.1  mm     G. Age:  33w 3d         72  %    CI:        75.71   %    70 - 86                                                          FL/HC:      20.1   %    19.1 - 21.3  HC:      302.8  mm     G. Age:  33w 4d         44  %    HC/AC:      0.93        0.96 - 1.17  AC:       326   mm     G. Age:  36w 4d       > 99  %    FL/BPD:     73.4   %    71 - 87  FL:         61  mm     G. Age:  31w 5d         19  %    FL/AC:      18.7   %    20 - 24  Est. FW:    2481  gm      5 lb 8 oz     96  % ---------------------------------------------------------------------- OB History  Gravidity:    2         Term:   0        Prem:   0        SAB:   1  TOP:          0       Ectopic:  0        Living: 0 ---------------------------------------------------------------------- Gestational Age  LMP:           33w 4d        Date:  09/22/21                  EDD:   06/29/22  U/S Today:     33w 6d                                        EDD:   06/27/22  Best:          Milderd Meager 3d     Det. By:  U/S C R L  (11/25/21)    EDD:   07/07/22 ---------------------------------------------------------------------- Anatomy  Cranium:  Appears normal         LVOT:                   Previously seen  Cavum:                 Appears normal         Aortic Arch:            Previously seen  Ventricles:             Previously seen        Ductal Arch:            Previously seen  Choroid Plexus:        Previously seen        Diaphragm:              Appears normal  Cerebellum:            Previously seen        Stomach:                Appears normal, left                                                                        sided  Posterior Fossa:       Previously seen        Abdomen:                Appears normal  Nuchal Fold:           Previously seen        Abdominal Wall:         Previously seen  Face:                  Orbits and profile     Cord Vessels:           Previously seen                         previously seen  Lips:                  Previously seen        Kidneys:                Appear normal  Palate:                Not well visualized    Bladder:                Appears normal  Thoracic:              Previously seen        Spine:                  Limited views prev                                                                        seen  Heart:                 Previously seen        Upper Extremities:      Previously seen  RVOT:                  Previously seen        Lower Extremities:      Previously seen  Other:  Female gender previously seen. Lenses, Feet and Hands, Rt 5th, 3VV          previously visualized. Technically difficult due to maternal habitus and          fetal position. ---------------------------------------------------------------------- Comments  The patient is here for a BPP for DM2. She is at [facility]. EDD  of 07/07/2022 dated by: U/S C R L  (11/25/21). She has no  concerns today other than continued difficulty with her blood  glucose (fasting 70s to 120s and PP 140s to 190s). Her BP  has been increasing over the past weeks. She denies  headaches or vision changes. She has a home BP cuff and  knows when to go to the hospital for preeclampsia r/o.  Sonographic findings  Single intrauterine pregnancy.  Fetal cardiac activity: Observed.  Presentation: Breech.  Interval fetal anatomy appears  normal.  Fetal biometry shows the estimated fetal weight at the 96  percentile.  Amniotic fluid volume: Within normal limits. AFI: 21.26 cm.  MVP: 7.39 cm.  Placenta: Posterior.  BPP: 8/8.  Recommendations  1. BBPs weekly until delivery  2. Growth ultrasounds every 4 weeks until delivery  3. Delivery around 37 weeks or sooner if indicated  4. Home BP and glucose parameters given on when to come  to the hospital. Fetal movement precautions given. ----------------------------------------------------------------------                 Valeda Malm, DO Electronically Signed Final Report   05/15/2022 05:18 pm ----------------------------------------------------------------------    Assessment and Plan: Patient Active Problem List   Diagnosis Date Noted   Excessive fetal growth affecting management of mother in third trimester, antepartum 05/28/2022   Hypertension affecting pregnancy 05/28/2022   Gestational hypertension 05/18/2022   Dysplastic nevus 04/16/2022   BMI 37.0-37.9, adult 04/16/2022   Obesity in pregnancy, antepartum, third trimester 02/19/2022   Mixed hyperlipidemia 12/17/2021   Vitamin D deficiency 12/17/2021   Type 2 diabetes mellitus affecting pregnancy, antepartum 12/17/2021   Supervision of high risk pregnancy, antepartum 11/25/2021   Controlled type 2 diabetes mellitus without complication, with long-term current use of insulin ([place]) 08/12/2021   Diabetic peripheral neuropathy ([place]) 08/12/2021   Depression with anxiety 08/12/2021   Hypertensive d/o of pregnancy - HA improving - mild range BP in MAU - labs reassuring - continue procardia XL 4m daily as previously prescribed - given persistent SR BP at home, will admit overnight for BP monitoring. No convincing e/o preE w/ SF at this time.   2. T2DM - accucheck fasting & 2h PP - continue home insulin pump  3. FWB - known LGA fetus - due for BPP tomorrow AM - dNSTs, FHT w/ vitals  Dispo: admit to ante/OB specialty  care  KGale Journey MD [facility], [facility]

## 2022-05-28 NOTE — Progress Notes (Signed)
PRENATAL VISIT NOTE  Subjective:  Deborah Pierce is a 32 y.o. G2P0010 at 51w2dbeing seen today for ongoing prenatal care.  She is currently monitored for the following issues for this high-risk pregnancy and has Controlled type 2 diabetes mellitus without complication, with long-term current use of insulin ([place]; Diabetic peripheral neuropathy ([place]; Depression with anxiety; Supervision of high risk pregnancy, antepartum; Mixed hyperlipidemia; Vitamin D deficiency; Type 2 diabetes mellitus affecting pregnancy, antepartum; Obesity in pregnancy, antepartum, third trimester; Dysplastic nevus; BMI 37.0-37.9, adult; Gestational hypertension; and Excessive fetal growth affecting management of mother in third trimester, antepartum on their problem list.  Patient reports  HA all week and occasional visual s/s .  Contractions: Not present. Vag. Bleeding: None.  Movement: Present. Denies leaking of fluid.   The following portions of the patient's history were reviewed and updated as appropriate: allergies, current medications, past family history, past medical history, past social history, past surgical history and problem list.   Objective:   Vitals:   05/28/22 0852 05/28/22 0854  BP: (!) 162/96 (!) 150/91  Pulse:  84  Weight:  253 lb (114.8 kg)    Fetal Status: Fetal Heart Rate (bpm): 144   Movement: Present     General:  Alert, oriented and cooperative. Patient is in no acute distress.  Skin: Skin is warm and dry. No rash noted.   Cardiovascular: Normal heart rate noted. Normal s1 and s2, no MRGs  Respiratory: Normal respiratory effort, no problems with respiration noted CTAB  Abdomen: Soft, gravid, nttp appropriate for gestational age.  Pain/Pressure: Present     Pelvic: Cervical exam deferred        Extremities: Normal range of motion.  Edema: Mild pitting, slight indentation  Mental Status: Normal mood and affect. Normal behavior. Normal judgment and thought content.  Neuro: 1+  brachial  Assessment and Plan:  Pregnancy: G2P0010 at 343w2d. Excessive fetal growth affecting management of pregnancy in third trimester, single or unspecified fetus 2/2: efw 96%, 2481g, ac >99%, afi 21, 8/8, breech  2. Gestational hypertension, third trimester Officially diagnosed on 2/9. BPs at home have been occasionally in the severe range and we correlated her BP machine with ours and her home machine is accurate HA all week and occasional floaters (none currently).  Recommend MAU evaluation today and to stay NPO for now 6lbs weight gain since 2/9  I d/w her re: delivery at now or at 37wks depending on severe diagnosis or not.  3. [redacted] weeks gestation of pregnancy  4. Type 2 diabetes mellitus affecting pregnancy, antepartum Can follow up in MAU if patieht for discharge to home since I forgot to review today  5. BMI 37.0-37.9, adult  6. Malpresentation Breech on 2/9  Preterm labor symptoms and general obstetric precautions including but not limited to vaginal bleeding, contractions, leaking of fluid and fetal movement were reviewed in detail with the patient. Please refer to After Visit Summary for other counseling recommendations.   No follow-ups on file.  Future Appointments  Date Time Provider [place]/16/2024  2:30 PM [facility]/16/2024  2:45 PM [facility] US4 [facility]2/21/2024 11:15 AM PrDonnamae JudeMD [facility]  06/05/2022  3:15 PM [facility] NURSE [facility]2/23/2024  3:30 PM [facility] US3 [facility]2/28/2024  1:30 PM PrDonnamae JudeMD [facility]  06/12/2022  3:30 PM [facility] NURSE [facility]3/04/2022  3:45 PM [facility] US5 [facility]3/09/2022  1:30 PM Donnamae Jude, MD [facility]  06/24/2022 11:15 AM Donnamae Jude, MD [facility]  07/01/2022 11:15 AM Donnamae Jude, MD [facility]  07/08/2022 11:15 AM Donnamae Jude, MD [facility]  08/25/2022 10:30 AM Ralene Bathe, MD [facility] None  09/01/2022 10:30 AM Ralene Bathe, MD [facility] None    Aletha Halim, MD

## 2022-05-28 NOTE — Progress Notes (Signed)
ROB   CC: HA's elevated B/P's all week.

## 2022-05-29 ENCOUNTER — Ambulatory Visit: Payer: No Typology Code available for payment source

## 2022-05-29 ENCOUNTER — Other Ambulatory Visit: Payer: Self-pay

## 2022-05-29 ENCOUNTER — Inpatient Hospital Stay (HOSPITAL_COMMUNITY): Payer: No Typology Code available for payment source | Admitting: Anesthesiology

## 2022-05-29 ENCOUNTER — Encounter (HOSPITAL_COMMUNITY)
Admission: AD | Disposition: A | Discharge status: Home / Self Care | Payer: Self-pay | Source: Home / Self Care | Attending: Obstetrics and Gynecology

## 2022-05-29 ENCOUNTER — Inpatient Hospital Stay (HOSPITAL_BASED_OUTPATIENT_CLINIC_OR_DEPARTMENT_OTHER): Payer: No Typology Code available for payment source

## 2022-05-29 ENCOUNTER — Encounter (HOSPITAL_COMMUNITY): Payer: Self-pay | Admitting: Obstetrics and Gynecology

## 2022-05-29 DIAGNOSIS — O99213 Obesity complicating pregnancy, third trimester: Secondary | ICD-10-CM

## 2022-05-29 DIAGNOSIS — O24113 Pre-existing diabetes mellitus, type 2, in pregnancy, third trimester: Secondary | ICD-10-CM

## 2022-05-29 DIAGNOSIS — Z3A34 34 weeks gestation of pregnancy: Secondary | ICD-10-CM

## 2022-05-29 DIAGNOSIS — E119 Type 2 diabetes mellitus without complications: Secondary | ICD-10-CM | POA: Diagnosis not present

## 2022-05-29 DIAGNOSIS — O9902 Anemia complicating childbirth: Secondary | ICD-10-CM

## 2022-05-29 DIAGNOSIS — O133 Gestational [pregnancy-induced] hypertension without significant proteinuria, third trimester: Secondary | ICD-10-CM | POA: Diagnosis not present

## 2022-05-29 DIAGNOSIS — Z794 Long term (current) use of insulin: Secondary | ICD-10-CM

## 2022-05-29 DIAGNOSIS — O1414 Severe pre-eclampsia complicating childbirth: Secondary | ICD-10-CM

## 2022-05-29 DIAGNOSIS — O3663X0 Maternal care for excessive fetal growth, third trimester, not applicable or unspecified: Secondary | ICD-10-CM

## 2022-05-29 DIAGNOSIS — O321XX0 Maternal care for breech presentation, not applicable or unspecified: Secondary | ICD-10-CM

## 2022-05-29 DIAGNOSIS — E669 Obesity, unspecified: Secondary | ICD-10-CM

## 2022-05-29 DIAGNOSIS — D649 Anemia, unspecified: Secondary | ICD-10-CM

## 2022-05-29 DIAGNOSIS — Z7984 Long term (current) use of oral hypoglycemic drugs: Secondary | ICD-10-CM

## 2022-05-29 LAB — CBC
HCT: 33.5 % — ABNORMAL LOW (ref 36.0–46.0)
HCT: 33.7 % — ABNORMAL LOW (ref 36.0–46.0)
Hemoglobin: 11.2 g/dL — ABNORMAL LOW (ref 12.0–15.0)
Hemoglobin: 10.8 g/dL — ABNORMAL LOW (ref 12.0–15.0)
MCH: 27.9 pg (ref 26.0–34.0)
MCH: 27.3 pg (ref 26.0–34.0)
MCHC: 33.4 g/dL (ref 30.0–36.0)
MCHC: 32 g/dL (ref 30.0–36.0)
MCV: 83.5 fL (ref 80.0–100.0)
MCV: 85.3 fL (ref 80.0–100.0)
Platelets: 313 10*3/uL (ref 150–400)
Platelets: 325 10*3/uL (ref 150–400)
RBC: 4.01 MIL/uL (ref 3.87–5.11)
RBC: 3.95 MIL/uL (ref 3.87–5.11)
RDW: 14.6 % (ref 11.5–15.5)
RDW: 14.6 % (ref 11.5–15.5)
WBC: 11.8 10*3/uL — ABNORMAL HIGH (ref 4.0–10.5)
WBC: 12.5 10*3/uL — ABNORMAL HIGH (ref 4.0–10.5)
nRBC: 0 % (ref 0.0–0.2)
nRBC: 0 % (ref 0.0–0.2)

## 2022-05-29 LAB — COMPREHENSIVE METABOLIC PANEL
ALT: 17 U/L (ref 0–44)
AST: 22 U/L (ref 15–41)
Albumin: 2.2 g/dL — ABNORMAL LOW (ref 3.5–5.0)
Alkaline Phosphatase: 69 U/L (ref 38–126)
Anion gap: 12 (ref 5–15)
BUN: 10 mg/dL (ref 6–20)
CO2: 23 mmol/L (ref 22–32)
Calcium: 8.2 mg/dL — ABNORMAL LOW (ref 8.9–10.3)
Chloride: 101 mmol/L (ref 98–111)
Creatinine, Ser: 0.72 mg/dL (ref 0.44–1.00)
GFR, Estimated: >60 mL/min (ref >60)
Glucose, Bld: 75 mg/dL (ref 70–99)
Potassium: 4.1 mmol/L (ref 3.5–5.1)
Sodium: 136 mmol/L (ref 135–145)
Total Bilirubin: 0.2 mg/dL — ABNORMAL LOW (ref 0.3–1.2)
Total Protein: 5.3 g/dL — ABNORMAL LOW (ref 6.5–8.1)

## 2022-05-29 LAB — GLUCOSE, CAPILLARY
Glucose-Capillary: 66 mg/dL — ABNORMAL LOW (ref 70–99)
Glucose-Capillary: 74 mg/dL (ref 70–99)
Glucose-Capillary: 89 mg/dL (ref 70–99)

## 2022-05-29 SURGERY — Surgical Case
Anesthesia: Epidural | Site: Abdomen

## 2022-05-29 MED ORDER — MORPHINE SULFATE (PF) 0.5 MG/ML IJ SOLN
INTRAMUSCULAR | Status: AC
  Filled 2022-05-29: qty 10

## 2022-05-29 MED ORDER — DIPHENHYDRAMINE HCL 50 MG/ML IJ SOLN: 12.5000 mg | INTRAMUSCULAR | Status: DC | PRN

## 2022-05-29 MED ORDER — LACTATED RINGERS IV SOLN
500.0000 mL | Freq: Once | INTRAVENOUS | Status: AC
  Administered 2022-05-29: 500 mL via INTRAVENOUS

## 2022-05-29 MED ORDER — DIPHENHYDRAMINE HCL 25 MG PO CAPS
25.0000 mg | ORAL_CAPSULE | Freq: Four times a day (QID) | ORAL | Status: DC | PRN
  Administered 2022-05-29: 25 mg via ORAL

## 2022-05-29 MED ORDER — MEASLES, MUMPS & RUBELLA VAC IJ SOLR: 0.5000 mL | Freq: Once | INTRAMUSCULAR | Status: DC

## 2022-05-29 MED ORDER — WITCH HAZEL-GLYCERIN EX PADS: 1.0000 | MEDICATED_PAD | CUTANEOUS | Status: DC | PRN

## 2022-05-29 MED ORDER — KETOROLAC TROMETHAMINE 30 MG/ML IJ SOLN
INTRAMUSCULAR | Status: AC
  Filled 2022-05-29: qty 1

## 2022-05-29 MED ORDER — OXYCODONE HCL 5 MG PO TABS
5.0000 mg | ORAL_TABLET | Freq: Once | ORAL | Status: AC
  Administered 2022-05-29: 5 mg via ORAL
  Filled 2022-05-29: qty 1

## 2022-05-29 MED ORDER — NIFEDIPINE ER OSMOTIC RELEASE 30 MG PO TB24
30.0000 mg | ORAL_TABLET | Freq: Every day | ORAL | Status: DC
  Administered 2022-05-29 – 2022-05-31 (×3): 30 mg via ORAL
  Filled 2022-05-29 (×3): qty 1

## 2022-05-29 MED ORDER — IBUPROFEN 600 MG PO TABS
600.0000 mg | ORAL_TABLET | Freq: Four times a day (QID) | ORAL | Status: DC
  Administered 2022-05-29 – 2022-05-31 (×6): 600 mg via ORAL
  Filled 2022-05-29 (×6): qty 1

## 2022-05-29 MED ORDER — MAGNESIUM SULFATE 40 GM/1000ML IV SOLN: 2.0000 g/h | INTRAVENOUS | Status: DC

## 2022-05-29 MED ORDER — EPHEDRINE 5 MG/ML INJ: 10.0000 mg | INTRAVENOUS | Status: DC | PRN

## 2022-05-29 MED ORDER — HYDROMORPHONE HCL 1 MG/ML IJ SOLN: 0.2500 mg | INTRAMUSCULAR | Status: DC | PRN

## 2022-05-29 MED ORDER — FENTANYL CITRATE (PF) 100 MCG/2ML IJ SOLN
INTRAMUSCULAR | Status: AC
  Filled 2022-05-29: qty 2

## 2022-05-29 MED ORDER — ONDANSETRON HCL 4 MG/2ML IJ SOLN
INTRAMUSCULAR | Status: DC | PRN
  Administered 2022-05-29: 4 mg via INTRAVENOUS

## 2022-05-29 MED ORDER — SIMETHICONE 80 MG PO CHEW: 80.0000 mg | CHEWABLE_TABLET | ORAL | Status: DC | PRN

## 2022-05-29 MED ORDER — MAGNESIUM SULFATE 40 GM/1000ML IV SOLN
2.0000 g/h | INTRAVENOUS | Status: DC
  Administered 2022-05-30: 2 g/h via INTRAVENOUS
  Filled 2022-05-29: qty 1000

## 2022-05-29 MED ORDER — ONDANSETRON HCL 4 MG/2ML IJ SOLN: 4.0000 mg | Freq: Three times a day (TID) | INTRAMUSCULAR | Status: DC | PRN

## 2022-05-29 MED ORDER — PHENYLEPHRINE 80 MCG/ML (10ML) SYRINGE FOR IV PUSH (FOR BLOOD PRESSURE SUPPORT)
80.0000 ug | PREFILLED_SYRINGE | INTRAVENOUS | Status: DC | PRN
  Filled 2022-05-29: qty 10

## 2022-05-29 MED ORDER — MEPERIDINE HCL 25 MG/ML IJ SOLN: 6.2500 mg | INTRAMUSCULAR | Status: DC | PRN

## 2022-05-29 MED ORDER — LIDOCAINE-EPINEPHRINE (PF) 2 %-1:200000 IJ SOLN
INTRAMUSCULAR | Status: DC | PRN
  Administered 2022-05-29: 7 mL via EPIDURAL
  Administered 2022-05-29: 3 mL via EPIDURAL
  Administered 2022-05-29: 2 mL via EPIDURAL

## 2022-05-29 MED ORDER — SCOPOLAMINE 1 MG/3DAYS TD PT72
1.0000 | MEDICATED_PATCH | Freq: Once | TRANSDERMAL | Status: DC
  Administered 2022-05-29: 1.5 mg via TRANSDERMAL

## 2022-05-29 MED ORDER — FENTANYL-BUPIVACAINE-NACL 0.5-0.125-0.9 MG/250ML-% EP SOLN: 12.0000 mL/h | EPIDURAL | Status: DC | PRN

## 2022-05-29 MED ORDER — EPHEDRINE 5 MG/ML INJ
INTRAVENOUS | Status: AC
  Filled 2022-05-29: qty 5

## 2022-05-29 MED ORDER — MENTHOL 3 MG MT LOZG: 1.0000 | LOZENGE | OROMUCOSAL | Status: DC | PRN

## 2022-05-29 MED ORDER — OXYCODONE HCL 5 MG PO TABS: 5.0000 mg | ORAL_TABLET | ORAL | Status: DC | PRN

## 2022-05-29 MED ORDER — OXYTOCIN-SODIUM CHLORIDE 30-0.9 UT/500ML-% IV SOLN
INTRAVENOUS | Status: AC
  Filled 2022-05-29: qty 500

## 2022-05-29 MED ORDER — LABETALOL HCL 5 MG/ML IV SOLN
40.0000 mg | INTRAVENOUS | Status: DC | PRN
  Administered 2022-05-29: 40 mg via INTRAVENOUS
  Filled 2022-05-29: qty 8

## 2022-05-29 MED ORDER — SENNOSIDES-DOCUSATE SODIUM 8.6-50 MG PO TABS
2.0000 | ORAL_TABLET | ORAL | Status: DC
  Administered 2022-05-30: 2 via ORAL
  Filled 2022-05-29: qty 2

## 2022-05-29 MED ORDER — SCOPOLAMINE 1 MG/3DAYS TD PT72
MEDICATED_PATCH | TRANSDERMAL | Status: AC
  Filled 2022-05-29: qty 1

## 2022-05-29 MED ORDER — KETOROLAC TROMETHAMINE 30 MG/ML IJ SOLN: 30.0000 mg | Freq: Four times a day (QID) | INTRAMUSCULAR | Status: AC | PRN

## 2022-05-29 MED ORDER — OXYCODONE HCL 5 MG/5ML PO SOLN: 5.0000 mg | Freq: Once | ORAL | Status: DC | PRN

## 2022-05-29 MED ORDER — CEFAZOLIN SODIUM-DEXTROSE 2-4 GM/100ML-% IV SOLN: 2.0000 g | INTRAVENOUS | Status: DC

## 2022-05-29 MED ORDER — PHENYLEPHRINE 80 MCG/ML (10ML) SYRINGE FOR IV PUSH (FOR BLOOD PRESSURE SUPPORT)
PREFILLED_SYRINGE | INTRAVENOUS | Status: AC
  Filled 2022-05-29: qty 10

## 2022-05-29 MED ORDER — OXYTOCIN-SODIUM CHLORIDE 30-0.9 UT/500ML-% IV SOLN
INTRAVENOUS | Status: DC | PRN
  Administered 2022-05-29: 300 mL via INTRAVENOUS

## 2022-05-29 MED ORDER — EPHEDRINE 5 MG/ML INJ
10.0000 mg | INTRAVENOUS | Status: DC | PRN
  Filled 2022-05-29: qty 5

## 2022-05-29 MED ORDER — SOD CITRATE-CITRIC ACID 500-334 MG/5ML PO SOLN: 30.0000 mL | ORAL | Status: DC

## 2022-05-29 MED ORDER — LACTATED RINGERS IV SOLN: INTRAVENOUS | Status: DC

## 2022-05-29 MED ORDER — DEXTROSE 50 % IV SOLN
INTRAVENOUS | Status: AC
  Filled 2022-05-29: qty 50

## 2022-05-29 MED ORDER — ONDANSETRON HCL 4 MG/2ML IJ SOLN: 4.0000 mg | Freq: Once | INTRAMUSCULAR | Status: DC | PRN

## 2022-05-29 MED ORDER — TERBUTALINE SULFATE 1 MG/ML IJ SOLN
0.2500 mg | Freq: Once | INTRAMUSCULAR | Status: AC
  Administered 2022-05-29: 0.25 mg via SUBCUTANEOUS
  Filled 2022-05-29: qty 1

## 2022-05-29 MED ORDER — PHENYLEPHRINE HCL (PRESSORS) 10 MG/ML IV SOLN
INTRAVENOUS | Status: DC | PRN
  Administered 2022-05-29: 160 ug via INTRAVENOUS

## 2022-05-29 MED ORDER — HYDRALAZINE HCL 20 MG/ML IJ SOLN: 10.0000 mg | INTRAMUSCULAR | Status: DC | PRN

## 2022-05-29 MED ORDER — COCONUT OIL OIL
1.0000 | TOPICAL_OIL | Status: DC | PRN
  Administered 2022-05-29: 1 via TOPICAL

## 2022-05-29 MED ORDER — MAGNESIUM SULFATE BOLUS VIA INFUSION
4.0000 g | Freq: Once | INTRAVENOUS | Status: AC
  Administered 2022-05-29: 4 g via INTRAVENOUS
  Filled 2022-05-29: qty 1000

## 2022-05-29 MED ORDER — BUPIVACAINE HCL (PF) 0.25 % IJ SOLN
INTRAMUSCULAR | Status: DC | PRN
  Administered 2022-05-29: 1.4 mL via INTRATHECAL

## 2022-05-29 MED ORDER — SODIUM CHLORIDE 0.9% FLUSH: 3.0000 mL | INTRAVENOUS | Status: DC | PRN

## 2022-05-29 MED ORDER — ACETAMINOPHEN 500 MG PO TABS
1000.0000 mg | ORAL_TABLET | Freq: Four times a day (QID) | ORAL | Status: DC
  Administered 2022-05-29 – 2022-05-31 (×7): 1000 mg via ORAL
  Filled 2022-05-29 (×7): qty 2

## 2022-05-29 MED ORDER — EPHEDRINE SULFATE-NACL 50-0.9 MG/10ML-% IV SOSY
PREFILLED_SYRINGE | INTRAVENOUS | Status: DC | PRN
  Administered 2022-05-29: 10 mg via INTRAVENOUS

## 2022-05-29 MED ORDER — CEFAZOLIN SODIUM-DEXTROSE 2-3 GM-%(50ML) IV SOLR
INTRAVENOUS | Status: DC | PRN
  Administered 2022-05-29: 2 g via INTRAVENOUS

## 2022-05-29 MED ORDER — OXYCODONE HCL 5 MG PO TABS: 5.0000 mg | ORAL_TABLET | Freq: Once | ORAL | Status: DC | PRN

## 2022-05-29 MED ORDER — NALOXONE HCL 0.4 MG/ML IJ SOLN: 0.4000 mg | INTRAMUSCULAR | Status: DC | PRN

## 2022-05-29 MED ORDER — DIPHENHYDRAMINE HCL 25 MG PO CAPS
25.0000 mg | ORAL_CAPSULE | ORAL | Status: DC | PRN
  Administered 2022-05-30: 25 mg via ORAL
  Filled 2022-05-29 (×2): qty 1

## 2022-05-29 MED ORDER — SODIUM BICARBONATE 8.4 % IV SOLN
INTRAVENOUS | Status: DC | PRN
  Administered 2022-05-29: 4 mL via EPIDURAL

## 2022-05-29 MED ORDER — ENOXAPARIN SODIUM 60 MG/0.6ML IJ SOSY
60.0000 mg | PREFILLED_SYRINGE | INTRAMUSCULAR | Status: DC
  Administered 2022-05-30 – 2022-05-31 (×2): 60 mg via SUBCUTANEOUS
  Filled 2022-05-29 (×2): qty 0.6

## 2022-05-29 MED ORDER — STERILE WATER FOR IRRIGATION IR SOLN
Status: DC | PRN
  Administered 2022-05-29: 1

## 2022-05-29 MED ORDER — ZOLPIDEM TARTRATE 5 MG PO TABS: 5.0000 mg | ORAL_TABLET | Freq: Every evening | ORAL | Status: DC | PRN

## 2022-05-29 MED ORDER — PHENYLEPHRINE 80 MCG/ML (10ML) SYRINGE FOR IV PUSH (FOR BLOOD PRESSURE SUPPORT)
80.0000 ug | PREFILLED_SYRINGE | INTRAVENOUS | Status: DC | PRN
  Administered 2022-05-29: 80 ug via INTRAVENOUS

## 2022-05-29 MED ORDER — FENTANYL CITRATE (PF) 100 MCG/2ML IJ SOLN
INTRAMUSCULAR | Status: DC | PRN
  Administered 2022-05-29: 100 ug via EPIDURAL

## 2022-05-29 MED ORDER — LABETALOL HCL 5 MG/ML IV SOLN
20.0000 mg | INTRAVENOUS | Status: DC | PRN
  Administered 2022-05-29: 20 mg via INTRAVENOUS

## 2022-05-29 MED ORDER — MAGNESIUM SULFATE 40 GM/1000ML IV SOLN
INTRAVENOUS | Status: AC
  Administered 2022-05-29: 2 g/h via INTRAVENOUS
  Filled 2022-05-29: qty 1000

## 2022-05-29 MED ORDER — LABETALOL HCL 5 MG/ML IV SOLN
INTRAVENOUS | Status: AC
  Filled 2022-05-29: qty 4

## 2022-05-29 MED ORDER — PRENATAL MULTIVITAMIN CH
1.0000 | ORAL_TABLET | Freq: Every day | ORAL | Status: DC
  Administered 2022-05-30 – 2022-05-31 (×2): 1 via ORAL
  Filled 2022-05-29 (×2): qty 1

## 2022-05-29 MED ORDER — ACETAMINOPHEN 500 MG PO TABS: 1000.0000 mg | ORAL_TABLET | Freq: Four times a day (QID) | ORAL | Status: DC

## 2022-05-29 MED ORDER — NALOXONE HCL 4 MG/10ML IJ SOLN: 1.0000 ug/kg/h | INTRAVENOUS | Status: DC | PRN

## 2022-05-29 MED ORDER — INSULIN PUMP
Freq: Three times a day (TID) | SUBCUTANEOUS | Status: DC
  Administered 2022-05-31: 10 via SUBCUTANEOUS
  Filled 2022-05-29: qty 1

## 2022-05-29 MED ORDER — DIBUCAINE (PERIANAL) 1 % EX OINT: 1.0000 | TOPICAL_OINTMENT | CUTANEOUS | Status: DC | PRN

## 2022-05-29 MED ORDER — MORPHINE SULFATE (PF) 0.5 MG/ML IJ SOLN
INTRAMUSCULAR | Status: DC | PRN
  Administered 2022-05-29: 3 mg via EPIDURAL

## 2022-05-29 MED ORDER — LABETALOL HCL 5 MG/ML IV SOLN: 80.0000 mg | INTRAVENOUS | Status: DC | PRN

## 2022-05-29 MED ORDER — DIPHENHYDRAMINE HCL 50 MG/ML IJ SOLN
12.5000 mg | INTRAMUSCULAR | Status: DC | PRN
  Administered 2022-05-29: 12.5 mg via INTRAVENOUS
  Filled 2022-05-29: qty 1

## 2022-05-29 MED ORDER — OXYTOCIN-SODIUM CHLORIDE 30-0.9 UT/500ML-% IV SOLN: 2.5000 [IU]/h | INTRAVENOUS | Status: AC

## 2022-05-29 MED ORDER — ONDANSETRON HCL 4 MG/2ML IJ SOLN
INTRAMUSCULAR | Status: AC
  Filled 2022-05-29: qty 2

## 2022-05-29 MED ORDER — SIMETHICONE 80 MG PO CHEW
80.0000 mg | CHEWABLE_TABLET | Freq: Three times a day (TID) | ORAL | Status: DC
  Administered 2022-05-29 – 2022-05-31 (×5): 80 mg via ORAL
  Filled 2022-05-29 (×5): qty 1

## 2022-05-29 MED ORDER — AMISULPRIDE (ANTIEMETIC) 5 MG/2ML IV SOLN: 10.0000 mg | Freq: Once | INTRAVENOUS | Status: DC | PRN

## 2022-05-29 MED ORDER — SODIUM CHLORIDE 0.9 % IR SOLN
Status: DC | PRN
  Administered 2022-05-29: 1000 mL

## 2022-05-29 MED ORDER — KETOROLAC TROMETHAMINE 30 MG/ML IJ SOLN
30.0000 mg | Freq: Once | INTRAMUSCULAR | Status: AC | PRN
  Administered 2022-05-29: 30 mg via INTRAVENOUS

## 2022-05-29 SURGICAL SUPPLY — 35 items
BENZOIN TINCTURE PRP APPL 2/3 (GAUZE/BANDAGES/DRESSINGS) ×1 IMPLANT
CANISTER PREVENA PLUS 150 (CANNISTER) IMPLANT
CHLORAPREP W/TINT 26 (MISCELLANEOUS) ×2 IMPLANT
CLAMP UMBILICAL CORD (MISCELLANEOUS) ×1 IMPLANT
CLOTH BEACON ORANGE TIMEOUT ST (SAFETY) ×1 IMPLANT
DRESSING PREVENA PLUS CUSTOM (GAUZE/BANDAGES/DRESSINGS) IMPLANT
DRSG OPSITE POSTOP 4X10 (GAUZE/BANDAGES/DRESSINGS) ×1 IMPLANT
DRSG PREVENA PLUS CUSTOM (GAUZE/BANDAGES/DRESSINGS) ×1
ELECT REM PT RETURN 9FT ADLT (ELECTROSURGICAL) ×1
ELECTRODE REM PT RTRN 9FT ADLT (ELECTROSURGICAL) ×1 IMPLANT
EXTRACTOR VACUUM M CUP 4 TUBE (SUCTIONS) IMPLANT
GAUZE SPONGE 4X4 12PLY STRL LF (GAUZE/BANDAGES/DRESSINGS) IMPLANT
GLOVE BIOGEL PI IND STRL 7.0 (GLOVE) ×2 IMPLANT
GLOVE BIOGEL PI IND STRL 7.5 (GLOVE) ×2 IMPLANT
GLOVE ECLIPSE 7.5 STRL STRAW (GLOVE) ×1 IMPLANT
GOWN STRL REUS W/TWL LRG LVL3 (GOWN DISPOSABLE) ×3 IMPLANT
KIT ABG SYR 3ML LUER SLIP (SYRINGE) IMPLANT
NDL HYPO 25X5/8 SAFETYGLIDE (NEEDLE) IMPLANT
NEEDLE HYPO 25X5/8 SAFETYGLIDE (NEEDLE) IMPLANT
NS IRRIG 1000ML POUR BTL (IV SOLUTION) ×1 IMPLANT
PACK C SECTION WH (CUSTOM PROCEDURE TRAY) ×1 IMPLANT
PAD OB MATERNITY 4.3X12.25 (PERSONAL CARE ITEMS) ×1 IMPLANT
RTRCTR C-SECT PINK 25CM LRG (MISCELLANEOUS) ×1 IMPLANT
STRIP CLOSURE SKIN 1/2X4 (GAUZE/BANDAGES/DRESSINGS) ×1 IMPLANT
SUT PLAIN 0 NONE (SUTURE) ×1 IMPLANT
SUT PLAIN 2 0 XLH (SUTURE) IMPLANT
SUT VIC AB 0 CT1 36 (SUTURE) ×3 IMPLANT
SUT VIC AB 0 CTX 36 (SUTURE) ×1
SUT VIC AB 0 CTX36XBRD ANBCTRL (SUTURE) ×1 IMPLANT
SUT VIC AB 2-0 CT1 27 (SUTURE) ×1
SUT VIC AB 2-0 CT1 TAPERPNT 27 (SUTURE) ×1 IMPLANT
SUT VIC AB 4-0 KS 27 (SUTURE) ×1 IMPLANT
TOWEL OR 17X24 6PK STRL BLUE (TOWEL DISPOSABLE) ×1 IMPLANT
TRAY FOLEY W/BAG SLVR 14FR LF (SET/KITS/TRAYS/PACK) ×1 IMPLANT
WATER STERILE IRR 1000ML POUR (IV SOLUTION) ×1 IMPLANT

## 2022-05-29 NOTE — Discharge Summary (Signed)
Postpartum Discharge Summary  Date of Service updated***     Patient Name: Deborah Pierce DOB: [birth year] MRN: [id number]  Date of admission: 05/28/2022 Delivery date:05/29/2022  Delivering provider: Laurey Arrow [place]  Date of discharge: 05/29/2022  Admitting diagnosis: Hypertension affecting pregnancy [O16.9] Intrauterine pregnancy: [redacted]w[redacted]d    Secondary diagnosis:  Principal Problem:   Severe preeclampsia, third trimester Active Problems:   Controlled type 2 diabetes mellitus without complication, with long-term current use of insulin ([place]   Supervision of high risk pregnancy, antepartum   Type 2 diabetes mellitus affecting pregnancy, antepartum   Excessive fetal growth affecting management of mother in third trimester, antepartum   Hypertension affecting pregnancy  Additional problems: ***    Discharge diagnosis: Preterm Pregnancy Delivered, Preeclampsia (severe), and T2DM                                               Post partum procedures:{Postpartum procedures:23558} Augmentation: N/A Complications: None  Hospital course: Sceduled C/S   32y.o. yo G2P0010 at 330w3das admitted to the hospital 05/28/2022 for scheduled cesarean section with the following indication:Malpresentation.Delivery details are as follows:  Membrane Rupture Time/Date: 12:28 PM ,05/29/2022   Delivery Method:C-Section, Low Transverse  Details of operation can be found in separate operative note.  Patient had a postpartum course complicated by***.  She is ambulating, tolerating a regular diet, passing flatus, and urinating well. Patient is discharged home in stable condition on  05/29/22        Newborn Data: Birth date:05/29/2022  Birth time:12:29 PM  Gender:Female  Living status:Living  Apgars:1 ,7  Weight:3480 g     Magnesium Sulfate received: Yes: Seizure prophylaxis BMZ received: Yes Rhophylac:N/A MMR:N/A T-DaP:Given prenatally Flu: {FWG:1132360ransfusion:{Transfusion  received:30440034}  Physical exam  Vitals:   05/29/22 1100 05/29/22 1108 05/29/22 1121 05/29/22 1141  BP: 122/75 127/73 (!) 140/81 (!) 158/85  Pulse: 82 81 84 90  Resp: 18     Temp:      TempSrc:      SpO2: 96% 96%    Weight:      Height:       General: {Exam; general:21111117} Lochia: {Desc; appropriate/inappropriate:30686::"appropriate"} Uterine Fundus: {Desc; firm/soft:30687} Incision: {Exam; incision:21111123} DVT Evaluation: {Exam; dvt:2111122} Labs: Lab Results  Component Value Date   WBC 11.8 (H) 05/29/2022   HGB 11.2 (L) 05/29/2022   HCT 33.5 (L) 05/29/2022   MCV 83.5 05/29/2022   PLT 313 05/29/2022      Latest Ref Rng & Units 05/29/2022    5:27 AM  CMP  Glucose 70 - 99 mg/dL 75   BUN 6 - 20 mg/dL 10   Creatinine 0.44 - 1.00 mg/dL 0.72   Sodium 135 - 145 mmol/L 136   Potassium 3.5 - 5.1 mmol/L 4.1   Chloride 98 - 111 mmol/L 101   CO2 22 - 32 mmol/L 23   Calcium 8.9 - 10.3 mg/dL 8.2   Total Protein 6.5 - 8.1 g/dL 5.3   Total Bilirubin 0.3 - 1.2 mg/dL 0.2   Alkaline Phos 38 - 126 U/L 69   AST 15 - 41 U/L 22   ALT 0 - 44 U/L 17    Edinburgh Score:     No data to display           After visit meds:  Allergies as of 05/29/2022  Reactions   Fruit Extracts Anaphylaxis   Apples, oranges, kiwi, peaches, plums and carrots   Insulins Nausea Only, Rash   Insulin glargine, degludec, aspart   Tree Extract Itching   Bee Venom Rash   unknown   Insulin Degludec-liraglutide Rash   Insulin Glargine-lixisenatide Rash   Penicillins Rash   Sulfa Antibiotics Rash     Med Rec must be completed prior to using this [place]***        Discharge home in stable condition Infant Feeding: {Baby feeding:23562} Infant Disposition:{CHL IP OB HOME WITH OP:[id number] Discharge instruction: per After Visit Summary and Postpartum booklet. Activity: Advance as tolerated. Pelvic rest for 6 weeks.  Diet: {OB TF:3416389 Future Appointments: Future  Appointments  Date Time Provider Department Center  06/03/2022 11:15 AM Donnamae Jude, MD [facility]  06/10/2022  1:30 PM Donnamae Jude, MD [facility]  06/17/2022  1:30 PM Donnamae Jude, MD [facility]  06/24/2022 11:15 AM Donnamae Jude, MD [facility]  07/01/2022 11:15 AM Donnamae Jude, MD [facility]  07/08/2022 11:15 AM Donnamae Jude, MD [facility]  08/25/2022 10:30 AM Ralene Bathe, MD [facility] None  09/01/2022 10:30 AM Ralene Bathe, MD [facility] None   Follow up Visit: Message sent   Please schedule this patient for a In person postpartum visit in 4 weeks with the following provider: Any provider. Additional Postpartum F/U:Incision check 1 week and BP check 1 week  High risk pregnancy complicated by: GDM and HTN Delivery mode:  C-Section, Low Transverse  Anticipated Birth Control:  Unsure   05/29/2022 Concepcion Living, MD

## 2022-05-29 NOTE — Plan of Care (Signed)
  Problem: Education: Goal: Knowledge of disease or condition will improve Outcome: Progressing Goal: Knowledge of the prescribed therapeutic regimen will improve Outcome: Progressing   Problem: Fluid Volume: Goal: Peripheral tissue perfusion will improve Outcome: Progressing   Problem: Clinical Measurements: Goal: Complications related to disease process, condition or treatment will be avoided or minimized Outcome: Progressing   Problem: Education: Goal: Knowledge of General Education information will improve Description: Including pain rating scale, medication(s)/side effects and non-pharmacologic comfort measures Outcome: Progressing   Problem: Health Behavior/Discharge Planning: Goal: Ability to manage health-related needs will improve Outcome: Progressing   Problem: Clinical Measurements: Goal: Ability to maintain clinical measurements within normal limits will improve Outcome: Progressing Goal: Will remain free from infection Outcome: Progressing Goal: Diagnostic test results will improve Outcome: Progressing Goal: Respiratory complications will improve Outcome: Progressing Goal: Cardiovascular complication will be avoided Outcome: Progressing   Problem: Activity: Goal: Risk for activity intolerance will decrease Outcome: Progressing   Problem: Nutrition: Goal: Adequate nutrition will be maintained Outcome: Progressing   Problem: Coping: Goal: Level of anxiety will decrease Outcome: Progressing   Problem: Elimination: Goal: Will not experience complications related to bowel motility Outcome: Progressing Goal: Will not experience complications related to urinary retention Outcome: Progressing   Problem: Pain Managment: Goal: General experience of comfort will improve Outcome: Progressing   Problem: Safety: Goal: Ability to remain free from injury will improve Outcome: Progressing   Problem: Skin Integrity: Goal: Risk for impaired skin integrity will  decrease Outcome: Progressing   Problem: Education: Goal: Knowledge of the prescribed therapeutic regimen will improve Outcome: Progressing Goal: Understanding of sexual limitations or changes related to disease process or condition will improve Outcome: Progressing Goal: Individualized Educational Video(s) Outcome: Progressing   Problem: Self-Concept: Goal: Communication of feelings regarding changes in body function or appearance will improve Outcome: Progressing   Problem: Skin Integrity: Goal: Demonstration of wound healing without infection will improve Outcome: Progressing   Problem: Education: Goal: Knowledge of condition will improve Outcome: Progressing Goal: Individualized Educational Video(s) Outcome: Progressing Goal: Individualized Newborn Educational Video(s) Outcome: Progressing   Problem: Activity: Goal: Will verbalize the importance of balancing activity with adequate rest periods Outcome: Progressing Goal: Ability to tolerate increased activity will improve Outcome: Progressing   Problem: Coping: Goal: Ability to identify and utilize available resources and services will improve Outcome: Progressing   Problem: Life Cycle: Goal: Chance of risk for complications during the postpartum period will decrease Outcome: Progressing   Problem: Role Relationship: Goal: Ability to demonstrate positive interaction with newborn will improve Outcome: Progressing   Problem: Skin Integrity: Goal: Demonstration of wound healing without infection will improve Outcome: Progressing

## 2022-05-29 NOTE — Op Note (Signed)
Cesarean Section Operative Report  Lorri Tejera  05/28/2022 - 05/29/2022  Indications: breech presentation (failed ECV), preeclampsia with severe features  Pre-operative Diagnosis: primary low transverse cesarean section, breech extraction  Post-operative Diagnosis: Same   Surgeon: Surgeon(s) and Role:    * Kavaughn Faucett, Ailene Rud, MD - Primary    * Caron Presume, Angelyn Punt, MD - Assisting   Attending Attestation: I was present and scrubbed for the entire procedure.   An experienced assistant was required given the standard of surgical care given the complexity of the case.  This assistant was needed for exposure, dissection, suctioning, retraction, instrument exchange, assisting with delivery with administration of fundal pressure, and for overall help during the procedure.  Anesthesia: epidural    Quantified Blood Loss: 584 ml  Total IV Fluids: 800 ml LR  Urine Output:: 300 ml yellow urine  Specimens: none  Findings: Viable female infant in breech presentation; Apgars pending; weight pending; arterial cord pH 7.21;  clear amniotic fluid; intact placenta with three vessel cord; normal uterus, fallopian tubes and ovaries bilaterally.  Baby condition / location:  NICU  Complications: no complications  Indications: [name] is a 32 y.o. G2P0010 with an IUP 79w3dpresenting with preeclampsia with severe features, found to be breech. Failed attempt at ECV.  The risks, benefits, complications, treatment options, and exected outcomes were discussed with the patient . The patient dwith the proposed plan, giving informed consent. identified as Deborah Pierce and the procedure verified as C-Section Delivery.  Procedure Details:  The patient was taken back to the operative suite where epidural anesthesia was dosed.  A time out was held and the above information confirmed.   After induction of anesthesia, the patient was draped and prepped in the usual sterile manner and placed in a dorsal  supine position with a leftward tilt. A Pfannenstiel incision was made and carried down through the subcutaneous tissue to the fascia. Fascial incision was made and bluntly extended transversely. The fascia was separated from the underlying rectus tissue superiorly and inferiorly. The peritoneum was identified and bluntly entered and extended longitudinally. Alexis retractor was placed. A bladder flap was not created. A low transverse uterine incision was made and extended bluntly. Delivered from frank breech presentation with a bit of diffulty was a viable infant with Apgars and weight as above.  Poor tone and minimal respiratory effort so we did not engage in delayed cord clamping.  The umbilical cord was clamped and cut cord blood was obtained for evaluation. Cord ph was sent. The placenta was removed Intact and appeared normal. The uterine incision was closed with running unlocked sutures 0-Vicryl in one layer.   Hemostasis was observed. The peritoneum was closed with 2-0 vicryl. The rectus muscles were examined and hemostasis observed. The fascia was then reapproximated with running sutures of 0-Vicryl. The subcuticular closure was closed with 2-0 plain gut. The skin was closed with 4-0 Vicryl.  Prevena wound vac was placed.  Instrument, sponge, and needle counts were correct prior the abdominal closure and were correct at the conclusion of the case.     Disposition: PACU - hemodynamically stable.   Maternal Condition: stable       Signed: NEnnis Forts2/16/2024 12:59 PM

## 2022-05-29 NOTE — Inpatient Diabetes Management (Signed)
Inpatient Diabetes Program Recommendations  AACE/ADA: New Consensus Statement on Inpatient Glycemic Control (2015)  Target Ranges:  Prepandial:   less than 140 mg/dL      Peak postprandial:   less than 180 mg/dL (1-2 hours)      Critically ill patients:  140 - 180 mg/dL   Lab Results  Component Value Date   GLUCAP 74 05/29/2022   HGBA1C 6.0 (H) 04/16/2022    Review of Glycemic Control  Latest Reference Range & Units 05/28/22 16:11 05/28/22 21:19 05/29/22 07:39 05/29/22 08:22  Glucose-Capillary 70 - 99 mg/dL 144 (H) 134 (H) 66 (L) 74  (H): Data is abnormally high (L): Data is abnormally low Diabetes history: Type 2 DM Outpatient Diabetes medications: Omnipod with Dexcom (see settings below) Prior to pregnancy: Novolog 0-15 units TID, Tresiba 200- 40 units QHS (was not taking per patient & MD note from 08/12/2021), Metformin 500 mg BID Current orders for Inpatient glycemic control: insulin pump  Inpatient Diabetes Program Recommendations:    Spoke with patient regarding diabetes and home regimen for diabetes management.  Patient is followed by Dr Meredith Pel, outpatient endocrinology and is on Omnipod with Novolog insulin as an outpatient. Patient has insulin pump connected to right upper arm and is currently infusing. Verified home insulin pump rates. Patient was only taking sliding scale prior to pregnancy, although Tresiba 40 units QD was ordered by endocrinology and A1C was 9%.  Current insulin pump settings are as follows:  Basal insulin  0000-0400 1.35 units/hour 0401-2200 2.0 units/hour 2201-0000 1.35 units/hour Total daily basal insulin: 44.1 units/24 hours  Carb Coverage 1:7.5 1 unit for every 7.5 grams of carbohydrates  Insulin Sensitivity 1:40 1 unit drops blood glucose 40 mg/dl  Target Glucose Goals 0000-0000 100-120 mg/dl  Recommend reducing total basal by 50% and decreasing carb ratios by 25%. Discussed recommendations in preparation for delivery with Dr Si Raider. Patient  adjusted insulin pump rates based on recommendations to: 0000-0000- 0.9, CR 1:10 prior to CS.   NURSING: Once insulin pump order set is ordered please print off the Patient insulin pump contract and flow sheet. The insulin pump contract should be signed by the patient and then placed in the chart. The patient insulin pump flow sheet will be completed by the patient at the bedside and the RN caring for the patient will use the patient's flow sheet to document in the [facility]. RN will need to complete the Nursing Insulin Pump Flowsheet at least once a shift. Patient will need to keep extra insulin pump supplies at the bedside at all times.   Thanks, Bronson Curb, MSN, RNC-OB Diabetes Coordinator [phone] (8a-5p)

## 2022-05-29 NOTE — Anesthesia Preprocedure Evaluation (Signed)
Anesthesia Evaluation  Patient identified by MRN, date of birth, ID band Patient awake    Reviewed: Allergy & Precautions, Patient's Chart, lab work & pertinent test results  Airway Mallampati: III  TM Distance: >3 FB Neck ROM: Full    Dental no notable dental hx.    Pulmonary neg pulmonary ROS   Pulmonary exam normal breath sounds clear to auscultation       Cardiovascular hypertension (preE with SF on mag), Pt. on medications Normal cardiovascular exam Rhythm:Regular Rate:Normal     Neuro/Psych  Headaches PSYCHIATRIC DISORDERS Anxiety Depression       GI/Hepatic negative GI ROS, Neg liver ROS,,,  Endo/Other  diabetes, Well Controlled, Type 1, Insulin Dependent, Oral Hypoglycemic Agents  Morbid obesityBMI 40 Has dexcom on R arm   Renal/GU negative Renal ROS  negative genitourinary   Musculoskeletal negative musculoskeletal ROS (+)    Abdominal  (+) + obese  Peds negative pediatric ROS (+)  Hematology  (+) Blood dyscrasia, anemia Hb 11.2, plt 313   Anesthesia Other Findings   Reproductive/Obstetrics (+) Pregnancy                              Anesthesia Physical Anesthesia Plan  ASA: 3  Anesthesia Plan: Epidural and Combined Spinal and Epidural   Post-op Pain Management:    Induction:   PONV Risk Score and Plan: 2  Airway Management Planned: Natural Airway  Additional Equipment: None  Intra-op Plan:   Post-operative Plan:   Informed Consent: I have reviewed the patients History and Physical, chart, labs and discussed the procedure including the risks, benefits and alternatives for the proposed anesthesia with the patient or authorized representative who has indicated his/her understanding and acceptance.       Plan Discussed with:   Anesthesia Plan Comments: (Plan for CSE for attempt at version on L+D- planning for vaginal delivery w/ epidural if successful, vs section  w/ epidural if unsuccessful  Last glucose 96)         Anesthesia Quick Evaluation

## 2022-05-29 NOTE — Procedures (Signed)
External Cephalic Version   After written informed consent, placement of spinal anesthesia, Terbutaline 0.25 mg SQ given, ECV was attempted under Ultrasound guidance.  Back down head at maternal right. Attempted twice to rotate to the left and twice to the right, no significant progress made. Fetal heart rate checked twice during procedure and found to be normal. No complications, patient tolerated the procedure well. Normal fetal heart tones after procedure. Rh positive. ECV failed, advised proceeding to cesarean and patient is agreeable.  The risks of cesarean section were discussed with the patient including but were not limited to: bleeding which may require transfusion or reoperation; infection which may require antibiotics; injury to bowel, bladder, ureters or other surrounding organs; injury to the fetus; need for additional procedures including hysterectomy in the event of a life-threatening hemorrhage; placental abnormalities wth subsequent pregnancies, incisional problems, thromboembolic phenomenon and other postoperative/anesthesia complications. Patient has been NPO since midnight save for juice this morning and she will remain NPO for procedure. Anesthesia and OR aware.  Preoperative prophylactic antibiotics and SCDs ordered on call to the OR.  To OR when ready.

## 2022-05-29 NOTE — Anesthesia Postprocedure Evaluation (Signed)
Anesthesia Post Note  Patient: Deborah Pierce  Procedure(s) Performed: CESAREAN SECTION (Abdomen)     Patient location during evaluation: PACU Anesthesia Type: Epidural Level of consciousness: awake and alert and oriented Pain management: pain level controlled Vital Signs Assessment: post-procedure vital signs reviewed and stable Respiratory status: spontaneous breathing, nonlabored ventilation and respiratory function stable Cardiovascular status: blood pressure returned to baseline and stable Postop Assessment: no headache, no backache, patient able to bend at knees and epidural receding Anesthetic complications: no   No notable events documented.  Last Vitals:  Vitals:   05/29/22 1345 05/29/22 1400  BP: 131/72 134/81  Pulse: 82 79  Resp: 15 17  Temp: 37.1 C   SpO2: 95% 94%    Last Pain:  Vitals:   05/29/22 1345  TempSrc:   PainSc: 0-No pain   Pain Goal:                Epidural/Spinal Function Cutaneous sensation: Able to Discern Pressure (05/29/22 1337), Patient able to flex knees: No (05/29/22 1337), Patient able to lift hips off bed: No (05/29/22 1337), Back pain beyond tenderness at insertion site: No (05/29/22 1337), Progressively worsening motor and/or sensory loss: No (05/29/22 1337), Bowel and/or bladder incontinence post epidural: No (05/29/22 1337)  Pervis Hocking

## 2022-05-29 NOTE — Anesthesia Procedure Notes (Signed)
Epidural Patient location during procedure: OB Start time: 05/29/2022 10:19 AM End time: 05/29/2022 10:27 AM  Staffing Anesthesiologist: Pervis Hocking, DO Performed: anesthesiologist   Preanesthetic Checklist Completed: patient identified, IV checked, risks and benefits discussed, monitors and equipment checked, pre-op evaluation and timeout performed  Epidural Patient position: sitting Prep: DuraPrep and site prepped and draped Patient monitoring: continuous pulse ox, blood pressure, heart rate and cardiac monitor Approach: midline Location: L3-L4 Injection technique: LOR air  Needle:  Needle type: Tuohy  Needle gauge: 17 G Needle length: 9 cm Needle insertion depth: 7 cm Catheter type: closed end flexible Catheter size: 19 Gauge Catheter at skin depth: 12 cm Test dose: negative  Assessment Sensory level: T8 Events: blood not aspirated, no cerebrospinal fluid, injection not painful, no injection resistance, no paresthesia and negative IV test  Additional Notes Patient identified. Risks/Benefits/Options discussed with patient including but not limited to bleeding, infection, nerve damage, paralysis, failed block, incomplete pain control, headache, blood pressure changes, nausea, vomiting, reactions to medication both or allergic, itching and postpartum back pain. Confirmed with bedside nurse the patient's most recent platelet count. Confirmed with patient that they are not currently taking any anticoagulation, have any bleeding history or any family history of bleeding disorders. Patient expressed understanding and wished to proceed. All questions were answered. Sterile technique was used throughout the entire procedure. Please see nursing notes for vital signs. Test dose was given through epidural catheter and negative prior to continuing to dose epidural or start infusion. Warning signs of high block given to the patient including shortness of breath, tingling/numbness in  hands, complete motor block, or any concerning symptoms with instructions to call for help. Patient was given instructions on fall risk and not to get out of bed. All questions and concerns addressed with instructions to call with any issues or inadequate analgesia.     CSE performed for version on L+D floor Reason for block:procedure for pain

## 2022-05-29 NOTE — Transfer of Care (Signed)
Immediate Anesthesia Transfer of Care Note  Patient: Deborah Pierce  Procedure(s) Performed: CESAREAN SECTION (Abdomen)  Patient Location: PACU  Anesthesia Type:Epidural  Level of Consciousness: awake  Airway & Oxygen Therapy: Patient Spontanous Breathing  Post-op Assessment: Report given to RN  Post vital signs: Reviewed and stable  Last Vitals:  Vitals Value Taken Time  BP    Temp    Pulse    Resp    SpO2      Last Pain:  Vitals:   05/29/22 0925  TempSrc: Oral  PainSc:          Complications: No notable events documented.

## 2022-05-29 NOTE — Progress Notes (Signed)
[facility] OB[facility]N Attending Note  Subjective:  Patient reports continued 8/10 headache this morning, not alleviated by Tylenol. Had one severe BP when she got up to go to bathroom but immediate recheck was 148/87.  Patient denies visual symptoms, RUQ/epigastric pain or other concerning symptoms.    FHR reassuring, no contractions, no LOF or vaginal bleeding. Good FM.   Admitted on 05/28/2022 for Hypertension affecting pregnancy.    Objective:  Blood pressure (!) 148/87, pulse (!) 105, temperature 97.8 F (36.6 C), temperature source Oral, resp. rate 18, height 5' 7"$  (1.702 m), weight 115.8 kg, last menstrual period 09/22/2021, SpO2 98 %. Patient Vitals for the past 24 hrs:  BP Temp Temp src Pulse Resp SpO2 Height Weight  05/29/22 0328 (!) 148/87 -- -- -- -- -- -- --  05/29/22 0300 -- -- -- -- -- 98 % -- --  05/29/22 0259 (!) 168/96 -- -- (!) 105 18 98 % -- --  05/28/22 2302 136/73 97.8 F (36.6 C) Oral 85 17 100 % -- --  05/28/22 2023 -- 97.8 F (36.6 C) Oral -- 16 -- -- --  05/28/22 1915 (!) 154/84 -- -- (!) 108 -- -- -- --  05/28/22 1542 (!) 142/51 98 F (36.7 C) Oral 93 16 98 % -- --  05/28/22 1529 -- -- -- -- -- 97 % -- --  05/28/22 1524 -- -- -- -- -- 97 % -- --  05/28/22 1519 -- -- -- -- -- 97 % -- --  05/28/22 1514 -- -- -- -- -- 97 % -- --  05/28/22 1509 -- -- -- -- -- 97 % -- --  05/28/22 1504 -- -- -- -- -- 97 % -- --  05/28/22 1500 (!) 151/85 -- -- 100 -- -- -- --  05/28/22 1459 -- -- -- -- -- 96 % -- --  05/28/22 1454 -- -- -- -- -- 96 % -- --  05/28/22 1449 -- -- -- -- -- 96 % -- --  05/28/22 1445 (!) 152/94 -- -- (!) 102 -- -- -- --  05/28/22 1444 -- -- -- -- -- 97 % -- --  05/28/22 1439 -- -- -- -- -- 96 % -- --  05/28/22 1434 -- -- -- -- -- 95 % -- --  05/28/22 1430 (!) 141/85 -- -- (!) 102 -- -- -- --  05/28/22 1429 -- -- -- -- -- 96 % -- --  05/28/22 1424 -- -- -- -- -- 95 % -- --  05/28/22 1419 -- -- -- -- -- 96 % -- --  05/28/22 1415 (!) 147/80  -- -- (!) 108 16 -- -- --  05/28/22 1414 -- -- -- -- -- 96 % -- --  05/28/22 1410 -- -- -- -- -- 97 % -- --  05/28/22 1405 -- -- -- -- -- 98 % -- --  05/28/22 1400 (!) 145/79 -- -- (!) 118 -- 97 % -- --  05/28/22 1352 (!) 148/82 -- -- (!) 103 -- -- -- --  05/28/22 1315 (!) 156/91 -- -- (!) 103 16 96 % -- --  05/28/22 1311 (!) 149/93 -- -- (!) 103 -- 97 % -- --  05/28/22 1300 (!) 150/77 -- -- 97 16 96 % -- --  05/28/22 1230 (!) 150/99 -- -- (!) 102 -- 97 % -- --  05/28/22 1215 (!) 152/95 -- -- 96 -- 97 % -- --  05/28/22 1200 139/87 -- -- 95 -- 97 % -- --  05/28/22  1145 (!) 146/92 -- -- 98 -- 97 % -- --  05/28/22 1130 (!) 155/98 -- -- 96 -- 96 % -- --  05/28/22 1115 (!) 149/95 -- -- 94 -- 97 % -- --  05/28/22 1100 (!) 147/90 -- -- 90 -- -- -- --  05/28/22 1045 (!) 152/91 -- -- 94 -- -- -- --  05/28/22 1030 (!) 157/92 -- -- 89 -- -- -- --  05/28/22 1022 (!) 155/89 98.5 F (36.9 C) Oral 88 18 -- -- --  05/28/22 1019 -- -- -- -- -- 98 % -- --  05/28/22 1001 -- -- -- -- -- -- 5' 7"$  (1.702 m) 115.8 kg    FHT  Baseline 145 bpm, moderate variability, +accelerations, no decelerations Toco: q 6-7 mins Gen: NAD HENT: Normocephalic, atraumatic Lungs: Normal respiratory effort Heart: Regular rate noted Abdomen: NT gravid fundus, soft Cervix: Deferred Ext: 2+ DTRs, no edema, no cyanosis, negative Homan's sign  Assessment & Plan:  32 y.o. G2P0010 at 2w3dad[facility] [place] development of [facility].  Will treat headache with stronger medication, patient is getting her scheduled BPP.  Will follow up results and her response, and make note of fetal presentation.  If headache continues or severe BP continues,  patient is aware she will need to proceed with delivery.  She knows she will need cesarean section if fetus still in breech presentation.  Continue close observation.   UVerita Schneiders MD, [place]for [facility] [place]

## 2022-05-29 NOTE — Progress Notes (Signed)
Was informed of patient's recent severe range BP of 172/92., recheck still  in severe range Labetalol protocol initiated,; will also get magnesium sulfate for eclampsia prophylaxis. Patient meets criteria for severe preeclampsia, will proceed with delivery Noted to have breech fetal presentation, BPP 8/8. She has been NPO since last night. Will proceed with cesarean delivery soon; Dr. Mikel Cella and Dr. Si Raider (oncoming attendings) aware of plan.   Deborah Schneiders, MD, [place] for [employer], [place]

## 2022-05-29 NOTE — Progress Notes (Signed)
Evaluated patient at bedside. She is a 32 yo g2p0 @ 34+3 here with severe preE (BP, headach), also type 2 diabetes on insulin pump, lga fetus. Bpp today 8/8 but breech. Patient aware it is time for delivery and she is in agreement with that plan. She is receiving labetalol protocol and magnesium. We discussed breech delivery (which I advised against), proceeding directly to cesarean, or attempt at version with plan for IOL if successful, section if not. We discussed risks of version including stillbirth, ROM, abruption, need for emergent cesarean. We also discussed risks/benefits of cesarean and vaginal delivery. Her mother had a bad experience with a c/s and patient very much wishes to avoid surgery if possible. She elects to proceed with ECV. We discussed risks/benefits of neuraxial anesthesia prior to ECV and she elects that.

## 2022-05-29 NOTE — Lactation Note (Signed)
This note was copied from a baby's chart.  NICU Lactation Consultation Note  Patient Name: Deborah Pierce [id number] Date: 05/29/2022 Age:32 hours  Subjective Reason for consult: Maternal endocrine disorder; Primapara; NICU baby; Late-preterm 34-36.6wks; 1st time breastfeeding  Visited with family of 64 43/50 weeks old AGA NICU female; Deborah Pierce is a P1 and reports she's already pumping but not getting any drops yet. Explained that the importance of pumping this early on is mainly for breast stimulation and not to get volume, she voiced understanding. Assisted with hand expression, she was able to get glistening drops of colostrum, praised her for her efforts. Her plan is to primarily breastfeed once baby is able to. Reviewed pumping schedule, lactogenesis II and anticipatory guidelines.  Objective Infant data: Mother's Current Feeding Choice: Breast Milk and Donor Milk  Maternal data: [id number]  C-Section, Low Transverse Significant Breast History:: moderate breast changes during the pregnancy (pigmentation) Current breast feeding challenges:: NICU admission Does the patient have breastfeeding experience prior to this delivery?: No Pumping frequency: initiated pumping within 6 hours post-partum Pumped volume: 0 mL Flange Size: 24 Risk factor for low milk supply:: primipara, prematurity, infant separation, maternal Mag and 584 cc. blood loss Pump: Personal (Motif Luna DEBP)  Assessment Infant: Feeding Status: -- (Scheduled feedings)  Maternal: Milk volume: Normal  Intervention/Plan Interventions: Breast feeding basics reviewed; Breast massage; Hand express; Coconut oil; DEBP; Education; [employer] brochure Tools: Pump; Flanges; Coconut oil Pump Education: Setup, frequency, and cleaning; Milk Storage  Plan of care: Encouraged bilateral pumping every 3 hours, ideally 8 pumping sessions/24 hours Breast massage, hand expression and coconut oil were also encouraged prior pumping  No  other support person at this time. All questions and concerns answered, family to contact [facility] services PRN.  Consult Status: NICU follow-up  NICU Follow-up type: New admission follow up; Maternal D/C visit   Deborah Pierce Deborah Pierce 05/29/2022, 7:05 PM

## 2022-05-30 LAB — CBC
HCT: 30.2 % — ABNORMAL LOW (ref 36.0–46.0)
Hemoglobin: 9.4 g/dL — ABNORMAL LOW (ref 12.0–15.0)
MCH: 26.9 pg (ref 26.0–34.0)
MCHC: 31.1 g/dL (ref 30.0–36.0)
MCV: 86.3 fL (ref 80.0–100.0)
Platelets: 309 10*3/uL (ref 150–400)
RBC: 3.5 MIL/uL — ABNORMAL LOW (ref 3.87–5.11)
RDW: 14.6 % (ref 11.5–15.5)
WBC: 12.9 10*3/uL — ABNORMAL HIGH (ref 4.0–10.5)
nRBC: 0 % (ref 0.0–0.2)

## 2022-05-30 LAB — GLUCOSE, CAPILLARY
Glucose-Capillary: 86 mg/dL (ref 70–99)
Glucose-Capillary: 122 mg/dL — ABNORMAL HIGH (ref 70–99)

## 2022-05-30 MED ORDER — FUROSEMIDE 20 MG PO TABS
20.0000 mg | ORAL_TABLET | Freq: Every day | ORAL | Status: DC
  Administered 2022-05-30 – 2022-05-31 (×2): 20 mg via ORAL
  Filled 2022-05-30 (×2): qty 1

## 2022-05-30 NOTE — Progress Notes (Signed)
Post Partum Day 1 - s/p pLTCS at 53w3dfor breech & preE w/ SF (BP, HA) Subjective: no complaints, up ad lib, voiding, and tolerating PO Denies HA, visual changes, CP/SOB, RUQ/epigastric pain  Objective: Blood pressure 126/75, pulse 67, temperature (!) 97.2 F (36.2 C), temperature source Axillary, resp. rate 18, height 5' 7"$  (1.702 m), weight 115.8 kg, last menstrual period 09/22/2021, SpO2 95 %, unknown if currently breastfeeding.  Physical Exam:  General: alert and no distress Lochia: appropriate Uterine Fundus: firm Incision: C/D/I with Prevena in place DVT Evaluation: No evidence of DVT seen on physical exam.  Recent Labs    05/29/22 1353 05/30/22 0442  HGB 10.8* 9.4*  HCT 33.7* 30.2*   Assessment/Plan: Postpartum - Contraception: POPs as bridge to IUD - MOF: pumping - Rh status: Rh+ - Rubella status: RI - Dispo: anticipate discharge home POD3-4 - Consults: lactation  Neonatal - Doing well in NICU per pt - Circumcision: desired, consent signed  3. PreE w/ SF (BP, HA) - asymptomatic - Normotensive on procardia XL 312mdaily - UOP adequate - lasix 2063maily x 5d - continue Mag 4/2 until 24h PP  4. T2DM - BG well controlled, pt denies any lows - continue insulin pump - diabetes coordinator following   LOS: 2 days   KymInez Catalina17/2024, 8:58 AM

## 2022-05-31 ENCOUNTER — Other Ambulatory Visit: Payer: Self-pay

## 2022-05-31 LAB — GLUCOSE, CAPILLARY
Glucose-Capillary: 77 mg/dL (ref 70–99)
Glucose-Capillary: 128 mg/dL — ABNORMAL HIGH (ref 70–99)

## 2022-05-31 MED ORDER — OXYCODONE HCL 5 MG PO TABS: 5.0000 mg | ORAL_TABLET | ORAL | 0 refills | Status: DC | PRN

## 2022-05-31 MED ORDER — INSULIN PUMP: 1.0000 | Freq: Three times a day (TID) | SUBCUTANEOUS | 0 refills | Status: DC

## 2022-05-31 MED ORDER — IBUPROFEN 600 MG PO TABS: 600.0000 mg | ORAL_TABLET | Freq: Four times a day (QID) | ORAL | 0 refills | Status: DC

## 2022-05-31 NOTE — Lactation Note (Signed)
This note was copied from a baby's chart.  NICU Lactation Consultation Note  Patient Name: Boy Chelcie Niedzielski [id number] Date: 05/31/2022 Age:32 hours  Subjective Reason for consult: Maternal endocrine disorder; Primapara; 1st time breastfeeding; Late-preterm 34-36.6wks; Maternal discharge  Visited with family of 61 hours old LPI NICU female; Ms. Lucken is a P1 and reports she's been trying to pump every 3 hours for sometimes misses a pumping session especially at night. Explained the importance of consistent pumping for the onset of lactogenesis II and the prevention of engorgement. She voiced that her OB Specialty care RN had to get another DEBP kit yesterday because the first one she had was not suctioning well from one side; she didn't any any further assistance since the pump is working properly now. She's getting discharged today. Reviewed discharge education, lactogenesis II/III pumping schedule, pump settings and anticipatory guidelines.  Objective Infant data: Mother's Current Feeding Choice: Breast Milk and Donor Milk  Infant feeding assessment Scale for Readiness: 5  Maternal data: G2P0111  C-Section, Low Transverse Pumping frequency: 4 times/24 hours Pumped volume: 1 mL Flange Size: 24 Pump: Personal (Motif Luna DEBP)  Assessment Infant: In NICU  Maternal: Milk volume: Normal  Intervention/Plan Interventions: Breast feeding basics reviewed; DEBP; Education Tools: Pump; Flanges; Coconut oil Pump Education: Setup, frequency, and cleaning; Milk Storage  Plan of care: Encouraged bilateral pumping every 3 hours, ideally 8 pumping sessions/24 hours She'll take all pump parts to baby's room after her discharge She'll switch her pump settings from initiation to expression mode once she starts getting 20 ml. of EBM combined   No other support person at this time. All questions and concerns answered, family to contact [facility] services PRN.  Consult Status: NICU follow-up  NICU  Follow-up type: Verify onset of copious milk; Verify absence of engorgement   Areonna Bran S Hemi Chacko 05/31/2022, 10:44 AM

## 2022-05-31 NOTE — Plan of Care (Signed)
Patient given discharge instructions by Rodena Medin RN. Patient ambulatory at discharge. Patient going to NICU to visit.

## 2022-06-03 ENCOUNTER — Encounter: Payer: No Typology Code available for payment source | Admitting: Family Medicine

## 2022-06-04 ENCOUNTER — Encounter: Payer: Self-pay | Admitting: Family Medicine

## 2022-06-05 ENCOUNTER — Ambulatory Visit: Payer: No Typology Code available for payment source

## 2022-06-05 ENCOUNTER — Ambulatory Visit (INDEPENDENT_AMBULATORY_CARE_PROVIDER_SITE_OTHER): Payer: No Typology Code available for payment source | Admitting: *Deleted

## 2022-06-05 VITALS — BP 139/84 | HR 116

## 2022-06-05 DIAGNOSIS — Z98891 History of uterine scar from previous surgery: Secondary | ICD-10-CM

## 2022-06-05 NOTE — Progress Notes (Signed)
Subjective:  Deborah Pierce is a 32 y.o. female here for BP check and incision check.  Hypertension ROS: taking medications as instructed, no medication side effects noted, no dyspnea on exertion, and no swelling of ankles.   Pt reports incision covered with Prevena wound vac.  Objective:  BP 139/84   Pulse (!) 116   Appearance alert, well appearing, and in no distress. Incision healing well   Assessment:   Blood Pressure today in office stable.  Incision healed nicely, no signs of infection, scar looks good, wound vac removed.  Plan:  Follow up as needed and or at postpartum visit.  Crosby Oyster, RN

## 2022-06-05 NOTE — Progress Notes (Signed)
Patient was assessed and managed by nursing staff during this encounter. I have reviewed the chart and agree with the documentation and plan. I have also made any necessary editorial changes.  Aletha Halim, MD 06/05/2022 1:14 PM

## 2022-06-08 ENCOUNTER — Ambulatory Visit: Payer: No Typology Code available for payment source

## 2022-06-09 ENCOUNTER — Ambulatory Visit: Payer: Self-pay

## 2022-06-09 NOTE — Lactation Note (Addendum)
This note was copied from a baby's chart.  NICU Lactation Consultation Note  Patient Name: Deborah Pierce [id number] Date: 06/09/2022 Age:32 days   Subjective Reason for consult: Follow-up assessment; Mother's request; Late-preterm 34-36.6wks; NICU baby; Maternal endocrine disorder; Breastfeeding assistance  LC in to assist with feeding at the breast.  Baby showing consistent feeding cues.  Mom was to do STS with baby, and then pre-pump 1/2 prior to latching baby..  Baby cueing while STS and Mom was trying to latch baby when [place] walked into room.  Mom in recliner, laid back and baby in cradle hold.  Mom trying to help nipple into baby's mouth.  Offered to help.  Adjusted baby's position to cross cradle hold, assisting Mom to sandwich and support her breast with one hand and support baby's head with other hand.  Baby latching, but slipping onto nipple.  Mom denied painful sucking.  Readjusted baby to a semi-reclined football hold on right breast.  Milk easily expressed.  A few attempts and then baby opened his mouth wide enough to attain a deep latch to Mom's breast.  Baby sustained the latch and fed consistently for 10 mins with deep jaw extensions and pauses hearing swallows.    No sign of stress signs at the breast with Mom not pre-pumping.  Baby relaxed and contented upon the end of the feeding.  RN to feed 1/3 NG feeding.  Showed Mom how to break the suction prior to taking him off the breast, nipple rounded and not pinched.  Placed baby STS on Mom's chest.  Encouraged continued consistent pumping and if baby latches and feeds at the breast, encouraged pumping after  Objective Infant data:  Infant feeding assessment Scale for Readiness: 2 Scale for Quality: 3     Maternal data: G2P0111  C-Section, Low Transverse  Pumping frequency: Every 3 hrs Pumped volume: 120 mL  Pump: Personal (Motif Luna DEBP)  Assessment Infant: LATCH Score: 9 10 min feeding on the  breast  Maternal: Milk volume: Normal   Intervention/Plan Interventions: Breast feeding basics reviewed; Assisted with latch; Skin to skin; Breast massage; Hand express; Breast compression; Adjust position; Support pillows; Position options; DEBP  Tools: Pump; Flanges; Bottle; Hands-free pumping top  Plan: Consult Status: NICU follow-up  NICU Follow-up type: Assist with IDF-1 (Mother to pre-pump before breastfeeding)    Deborah Pierce 06/09/2022, 12:24 PM

## 2022-06-09 NOTE — Lactation Note (Signed)
This note was copied from a baby's chart.  NICU Lactation Consultation Note  Patient Name: Deborah Pierce [id number] Date: 06/09/2022 Age:32 years   Subjective Reason for consult: Follow-up assessment; Mother's request; Late-preterm 34-36.6wks; NICU baby; Maternal endocrine disorder; Breastfeeding assistance  LC in to assist with attempting to latch baby to the breast.  Mom pre-pumped.  Baby sleepy and showing no feeding cues at this time.  Baby sleeping STS on Mom's chest.  Hand expressed a drop and tried to entice baby.    LC will follow up tomorrow.  Objective Infant data:  Infant feeding assessment Scale for Readiness: 1 Scale for Quality: 3     Maternal data: G2P0111  C-Section, Low Transverse No data recorded Pumping frequency: Every 3 hrs Pumped volume: 120 mL Pump: Personal (Motif Luna DEBP)  Assessment Infant: LATCH Score: 9  Feeding Status: IDF-1   Maternal: Milk volume: Normal   Intervention/Plan Interventions: Breast feeding basics reviewed; Assisted with latch; Skin to skin; Breast massage; Hand express; Breast compression; Adjust position; Support pillows; Position options; DEBP  Tools: Pump; Flanges; Bottle; Hands-free pumping top  Plan: Consult Status: NICU follow-up  NICU Follow-up type: Assist with IDF-1 (Mother to pre-pump before breastfeeding)    [name] 06/09/2022, 12:50 PM

## 2022-06-10 ENCOUNTER — Encounter: Payer: No Typology Code available for payment source | Admitting: Family Medicine

## 2022-06-12 ENCOUNTER — Ambulatory Visit: Payer: No Typology Code available for payment source

## 2022-06-17 ENCOUNTER — Encounter: Payer: No Typology Code available for payment source | Admitting: Family Medicine

## 2022-06-18 ENCOUNTER — Ambulatory Visit: Payer: Self-pay

## 2022-06-18 ENCOUNTER — Encounter: Payer: Self-pay | Admitting: *Deleted

## 2022-06-18 NOTE — Lactation Note (Signed)
This note was copied from a baby's chart.  NICU Lactation Consultation Note  Patient Name: Deborah Pierce [id number] Date: 06/18/2022 Age:32 wk.o.  Reason for consult: Weekly NICU follow-up; NICU baby; Maternal endocrine disorder; Early term 27-38.6wks  Subjective  Visited with family of 41 66/12 weeks old AGA NICU female; Deborah Pierce is a P1 and reports she continues pumping consistently during the day but she's having difficulty with keeping up pumping at night. She's also taking baby to breast but mainly concentrating on bottle feedings at this time. She feels comfortable doing independent breastfeeding but she's aware that she can call for latch assistance whenever it is needed. She also had questions about using a wearable pump before going back to work. Reviewed pumping schedule, types of breast pumps, IDF 2 and strategies to increase supply.  Objective Infant data: Mother's Current Feeding Choice: Breast Milk and Donor Milk  Infant feeding assessment Scale for Readiness: 2 Scale for Quality: 2  Maternal data: [id number]  C-Section, Low Transverse Pumping frequency: 5-6 times/24 hours Pumped volume: 80 mL (80-115 ml) Flange Size: 24 Pump: Personal (Motif Luna DEBP)  Assessment Infant: Feeding Status: Scheduled 9-12-3-6  Maternal: Milk volume: Low  Intervention/Plan Interventions: Breast feeding basics reviewed; DEBP; Education; Infant Driven Feeding Algorithm education  Tools: Pump; Flanges Pump Education: Setup, frequency, and cleaning; Milk Storage  Plan of care: Encouraged bilateral pumping every 3 hours, ideally 8 pumping sessions/24 hours She'll start power pumping in the AM She'll call for latch assistance when she's ready to take baby to breast to assess for transfer (IDF2)   No other support person at this time. All questions and concerns answered, family to contact [facility] services PRN.  Consult Status: NICU follow-up  NICU Follow-up type: Weekly NICU follow up;  Assist with IDF-2 (Mother does not need to pre-pump before breastfeeding)   Deborah Pierce Deborah Pierce 06/18/2022, 10:47 AM

## 2022-06-24 ENCOUNTER — Encounter: Payer: No Typology Code available for payment source | Admitting: Family Medicine

## 2022-06-24 ENCOUNTER — Ambulatory Visit: Payer: Self-pay

## 2022-06-24 NOTE — Lactation Note (Signed)
This note was copied from a baby's chart. Lactation Consultation Note  Patient Name: Boy Jaclyne Pullum [id number] Date: 06/24/2022 Age:32 wk.o. Reason for consult: Follow-up assessment;1st time breastfeeding;Primapara;Early term 37-38.6wks;NICU baby;Maternal endocrine disorder  LC briefly spoke with P1 Mom on day of discharge from the NICU.  Mom would like OP lactation appt.  [facility] sent to [facility] to [facility] RN [facility] for follow-up.    Discharge Discharge Education: Outpatient recommendation;Outpatient Epic message sent  Consult Status Consult Status: Complete Date: 06/24/22 Follow-up type: [place] 06/24/2022, 1:39 PM

## 2022-07-01 ENCOUNTER — Encounter: Payer: No Typology Code available for payment source | Admitting: Family Medicine

## 2022-07-07 ENCOUNTER — Other Ambulatory Visit: Payer: No Typology Code available for payment source

## 2022-07-07 ENCOUNTER — Encounter: Payer: Self-pay | Admitting: *Deleted

## 2022-07-07 ENCOUNTER — Ambulatory Visit: Payer: No Typology Code available for payment source | Admitting: Obstetrics & Gynecology

## 2022-07-07 ENCOUNTER — Encounter: Payer: Self-pay | Admitting: Obstetrics & Gynecology

## 2022-07-07 DIAGNOSIS — E119 Type 2 diabetes mellitus without complications: Secondary | ICD-10-CM

## 2022-07-07 DIAGNOSIS — O1414 Severe pre-eclampsia complicating childbirth: Secondary | ICD-10-CM

## 2022-07-07 DIAGNOSIS — Z794 Long term (current) use of insulin: Secondary | ICD-10-CM

## 2022-07-07 DIAGNOSIS — Z30011 Encounter for initial prescription of contraceptive pills: Secondary | ICD-10-CM

## 2022-07-07 MED ORDER — NORETHINDRONE 0.35 MG PO TABS: 1.0000 | ORAL_TABLET | Freq: Every day | ORAL | 11 refills | Status: DC

## 2022-07-07 MED ORDER — SLYND 4 MG PO TABS: 1.0000 | ORAL_TABLET | Freq: Every day | ORAL | 12 refills | Status: DC

## 2022-07-07 NOTE — Progress Notes (Signed)
[facility] Partum Visit Note  Deborah Pierce is a 32 y.o. [phone] female who presents for a postpartum visit. She is 5 weeks postpartum following a Low Transverse Cesarean Section.  I have fully reviewed the prenatal and intrapartum course. Patient has known T2DM.  The delivery was at 34 gestational weeks due to severe preeclampsia and baby in breech presentation after failed ECV attempt.  Anesthesia: epidural. Postpartum course has been uncomplicated. Baby is doing well. Baby is feeding by breast. Bleeding no bleeding. Bowel function is normal. Bladder function is normal. Patient is not sexually active. Contraception method is none, desires progestin only pills. Postpartum depression screening: negative.  The pregnancy intention screening data noted above was reviewed. Potential methods of contraception were discussed. The patient elected to proceed with No data recorded.   Edinburgh Postnatal Depression Scale - 07/07/22 0823       Edinburgh Postnatal Depression Scale:  In the Past 7 Days   I have been able to laugh and see the funny side of things. 0    I have looked forward with enjoyment to things. 0    I have blamed myself unnecessarily when things went wrong. 0    I have been anxious or worried for no good reason. 0    I have felt scared or panicky for no good reason. 0    Things have been getting on top of me. 0    I have been so unhappy that I have had difficulty sleeping. 0    I have felt sad or miserable. 0    I have been so unhappy that I have been crying. 0    The thought of harming myself has occurred to me. 0    Edinburgh Postnatal Depression Scale Total 0             Health Maintenance Due  Topic Date Due   COVID-19 Vaccine (1) Never done   FOOT EXAM  Never done   OPHTHALMOLOGY EXAM  Never done    The following portions of the patient's history were reviewed and updated as appropriate: allergies, current medications, past family history, past medical history, past  social history, past surgical history, and problem list.  Review of Systems Pertinent items noted in HPI and remainder of comprehensive ROS otherwise negative.  Objective:  BP 119/78   Pulse 91   Wt 238 lb (108 kg)   Breastfeeding Yes   BMI 37.28 kg/m    General:  alert and no distress   Breasts:  normal, done in presence of chaperone.  Lungs: clear to auscultation bilaterally  Heart:  regular rate and rhythm  Abdomen: soft, non-tender; bowel sounds normal; no masses,  no organomegaly   Wound well approximated incision  GU exam:  not indicated       Assessment:   1. Controlled type 2 diabetes mellitus without complication, with long-term current use of insulin ([place]) 2. Severe preeclampsia, delivered 3. Postpartum examination following cesarean delivery 4. OCP (oral contraceptive pills) initiation - norethindrone (MICRONOR) 0.35 MG tablet; Take 1 tablet (0.35 mg total) by mouth daily.  Dispense: 28 tablet; Refill: 11 - Drospirenone (SLYND) 4 MG TABS; Take 1 tablet (4 mg total) by mouth daily.  Dispense: 28 tablet; Refill: 12   Plan:   Essential components of care per ACOG recommendations:  1.  Mood and well being: Patient with negative depression screening today. Reviewed local resources for support.  - Patient tobacco use? No.   - hx of drug  use? No.    2. Infant care and feeding:  -Patient currently breastmilk feeding? Yes. Discussed returning to work and pumping. Reviewed importance of draining breast regularly to support lactation.  -Social determinants of health (SDOH) reviewed in EPIC. No concerns.  3. Sexuality, contraception and birth spacing - Patient does not want a pregnancy in the next year.  Desired family size is 1 children.  - Reviewed reproductive life planning. Reviewed contraceptive methods based on pt preferences and effectiveness.  Patient desired Oral Contraceptive today.   - Discussed birth spacing of 18 months  4. Sleep and fatigue -Encouraged  family/partner/community support of 4 hrs of uninterrupted sleep to help with mood and fatigue  5. Physical Recovery  - Discussed patients delivery and complications.  - Patient had a C-section.  - Patient has urinary incontinence? No. - Patient is safe to resume physical and sexual activity  6.  Health Maintenance - HM due items addressed Yes - Last pap smear  Diagnosis  Date Value Ref Range Status  08/27/2021   Final   - Negative for intraepithelial lesion or malignancy (NILM)   Pap smear not done at today's visit.  -Breast Cancer screening indicated? No.   7. Chronic Disease/Pregnancy Condition follow up:  T2DM and severe preeclampsia - PCP and Endocrinology follow up   Verita Schneiders, MD [facility] [place] [place]

## 2022-07-08 ENCOUNTER — Encounter: Payer: No Typology Code available for payment source | Admitting: Family Medicine

## 2022-07-08 ENCOUNTER — Telehealth (HOSPITAL_COMMUNITY): Payer: Self-pay

## 2022-07-08 NOTE — Telephone Encounter (Signed)
Preadmission labs

## 2022-07-13 ENCOUNTER — Encounter: Payer: Self-pay | Admitting: *Deleted

## 2022-07-19 ENCOUNTER — Encounter: Payer: Self-pay | Admitting: Dermatology

## 2022-07-20 ENCOUNTER — Other Ambulatory Visit: Payer: Self-pay | Admitting: Obstetrics & Gynecology

## 2022-07-20 DIAGNOSIS — O24119 Pre-existing diabetes mellitus, type 2, in pregnancy, unspecified trimester: Secondary | ICD-10-CM

## 2022-07-20 DIAGNOSIS — E119 Type 2 diabetes mellitus without complications: Secondary | ICD-10-CM

## 2022-07-21 ENCOUNTER — Encounter: Payer: Self-pay | Admitting: Dermatology

## 2022-07-21 ENCOUNTER — Ambulatory Visit: Payer: No Typology Code available for payment source | Admitting: Dermatology

## 2022-07-21 ENCOUNTER — Telehealth: Payer: Self-pay

## 2022-07-21 VITALS — BP 122/76

## 2022-07-21 DIAGNOSIS — D225 Melanocytic nevi of trunk: Secondary | ICD-10-CM | POA: Diagnosis not present

## 2022-07-21 DIAGNOSIS — D235 Other benign neoplasm of skin of trunk: Secondary | ICD-10-CM

## 2022-07-21 DIAGNOSIS — D492 Neoplasm of unspecified behavior of bone, soft tissue, and skin: Secondary | ICD-10-CM

## 2022-07-21 DIAGNOSIS — Z86018 Personal history of other benign neoplasm: Secondary | ICD-10-CM

## 2022-07-21 DIAGNOSIS — D239 Other benign neoplasm of skin, unspecified: Secondary | ICD-10-CM

## 2022-07-21 MED ORDER — MUPIROCIN 2 % EX OINT: 1.0000 | TOPICAL_OINTMENT | Freq: Every day | CUTANEOUS | 1 refills | Status: DC

## 2022-07-21 NOTE — Patient Instructions (Addendum)

## 2022-07-21 NOTE — Telephone Encounter (Signed)
Patient having a little discomfort after today's surgery.  Discussed Tylenol or Tylenol/Ibuprofen pain regimen.  Advised pt to call if any problems./sh

## 2022-07-21 NOTE — Progress Notes (Signed)
Follow-Up Visit   Subjective  Deborah Pierce is a 32 y.o. female who presents for the following: Severe dysplastic nevi x 2, bx proven (R to mid upper back paraspinal, L mid back paraspinal, pt presents for excision of one today).  The following portions of the chart were reviewed this encounter and updated as appropriate:   Tobacco  Allergies  Meds  Problems  Med Hx  Surg Hx  Fam Hx     Review of Systems:  No other skin or systemic complaints except as noted in HPI or Assessment and Plan.  Objective  Well appearing patient in no apparent distress; mood and affect are within normal limits.  A focused examination was performed including back, buttocks. Relevant physical exam findings are noted in the Assessment and Plan.  R to mid upper back paraspinal Pink bx site 1.1 x 0.7cm  L mid back paraspinal Pink bx site  L lat buttocks Bx site with repigmentation   Assessment & Plan  Neoplasm of skin R to mid upper back paraspinal  Skin excision  Lesion length (cm):  1.1 Lesion width (cm):  0.7 Margin per side (cm):  0.2 Total excision diameter (cm):  1.5 Informed consent: discussed and consent obtained   Timeout: patient name, date of birth, surgical site, and procedure verified   Procedure prep:  Patient was prepped and draped in usual sterile fashion Prep type:  Isopropyl alcohol and povidone-iodine Anesthesia: the lesion was anesthetized in a standard fashion   Anesthetic:  1% lidocaine w/ epinephrine 1-100,000 buffered w/ 8.4% NaHCO3 (9cc lido w/ epi, 3cc bupivicaine) Instrument used: #15 blade   Hemostasis achieved with: pressure   Hemostasis achieved with comment:  Electrocautery Outcome: patient tolerated procedure well with no complications   Post-procedure details: sterile dressing applied and wound care instructions given   Dressing type: bandage, pressure dressing and bacitracin (Mupirocin)    Skin repair Complexity:  Complex Final length (cm):   4 Reason for type of repair: reduce tension to allow closure, reduce the risk of dehiscence, infection, and necrosis, reduce subcutaneous dead space and avoid a hematoma, allow closure of the large defect, preserve normal anatomy, preserve normal anatomical and functional relationships and enhance both functionality and cosmetic results   Undermining: area extensively undermined   Undermining comment:  Undermining Defect 1.1cm Subcutaneous layers (deep stitches):  Suture size:  2-0 Suture type: Vicryl (polyglactin 910)   Subcutaneous suture technique: Inverted Dermal. Fine/surface layer approximation (top stitches):  Suture size:  2-0 Suture type: nylon   Stitches: simple running   Suture removal (days):  7 Hemostasis achieved with: pressure Outcome: patient tolerated procedure well with no complications   Post-procedure details: sterile dressing applied and wound care instructions given   Dressing type: bandage, pressure dressing and bacitracin (Mupirocin)    mupirocin ointment (BACTROBAN) 2 % Apply 1 Application topically daily. Qd to excision site  Specimen 1 - Surgical pathology Differential Diagnosis: D48.5 Bx proven Dysplastic nevus  Check Margins: yes Pink bx site 1.1 x 0.7cm [id number]  Dysplastic nevus L mid back paraspinal  Bx proven Severe Dysplastic Nevus, pt is scheduled for excision  History of dysplastic nevus L lat buttocks  With Repigmentation Plan shave removal on f/u   Return in about 1 week (around 07/28/2022) for suture removal, plan shave removal of repigmented dysplastic nevus L buttock.  I, Deborah Pierce, RMA, am acting as scribe for Armida Sans, MD . Documentation: I have reviewed the above documentation for accuracy and completeness, and  I agree with the above.  Sarina Ser, MD

## 2022-07-28 ENCOUNTER — Ambulatory Visit: Payer: No Typology Code available for payment source | Admitting: Dermatology

## 2022-07-28 DIAGNOSIS — Z4802 Encounter for removal of sutures: Secondary | ICD-10-CM

## 2022-07-28 DIAGNOSIS — D235 Other benign neoplasm of skin of trunk: Secondary | ICD-10-CM

## 2022-07-28 DIAGNOSIS — D239 Other benign neoplasm of skin, unspecified: Secondary | ICD-10-CM

## 2022-07-28 NOTE — Patient Instructions (Signed)
Due to recent changes in healthcare laws, you may see results of your pathology and/or laboratory studies on MyChart before the doctors have had a chance to review them. We understand that in some cases there may be results that are confusing or concerning to you. Please understand that not all results are received at the same time and often the doctors may need to interpret multiple results in order to provide you with the best plan of care or course of treatment. Therefore, we ask that you please give us 2 business days to thoroughly review all your results before contacting the office for clarification. Should we see a critical lab result, you will be contacted sooner.   If You Need Anything After Your Visit  If you have any questions or concerns for your doctor, please call our main line at [phone] and press option 4 to reach your doctor's medical assistant. If no one answers, please leave a voicemail as directed and we will return your call as soon as possible. Messages left after 4 pm will be answered the following business day.   You may also send us a message via MyChart. We typically respond to MyChart messages within 1-2 business days.  For prescription refills, please ask your pharmacy to contact our office. Our fax number is [phone].  If you have an urgent issue when the clinic is closed that cannot wait until the next business day, you can page your doctor at the number below.    Please note that while we do our best to be available for urgent issues outside of office hours, we are not available 24/7.   If you have an urgent issue and are unable to reach us, you may choose to seek medical care at your doctor's office, retail clinic, urgent care center, or emergency room.  If you have a medical emergency, please immediately call 911 or go to the emergency department.  Pager Numbers  - Dr. Kowalski: [phone]  - Dr. Moye: [phone]  - Dr. Stewart:  [phone]  In the event of inclement weather, please call our main line at [phone] for an update on the status of any delays or closures.  Dermatology Medication Tips: Please keep the boxes that topical medications come in in order to help keep track of the instructions about where and how to use these. Pharmacies typically print the medication instructions only on the boxes and not directly on the medication tubes.   If your medication is too expensive, please contact our office at [phone] option 4 or send us a message through MyChart.   We are unable to tell what your co-pay for medications will be in advance as this is different depending on your insurance coverage. However, we may be able to find a substitute medication at lower cost or fill out paperwork to get insurance to cover a needed medication.   If a prior authorization is required to get your medication covered by your insurance company, please allow us 1-2 business days to complete this process.  Drug prices often vary depending on where the prescription is filled and some pharmacies may offer cheaper prices.  The website www.goodrx.com contains coupons for medications through different pharmacies. The prices here do not account for what the cost may be with help from insurance (it may be cheaper with your insurance), but the website can give you the price if you did not use any insurance.  - You can print the associated coupon and take it with   your prescription to the pharmacy.  - You may also stop by our office during regular business hours and pick up a GoodRx coupon card.  - If you need your prescription sent electronically to a different pharmacy, notify our office through [facility] MyChart or by phone at [phone] option 4.     Si Usted Necesita Algo Despus de Su Visita  Tambin puede enviarnos un mensaje a travs de MyChart. Por lo general respondemos a los mensajes de MyChart en el transcurso de 1 a 2  das hbiles.  Para renovar recetas, por favor pida a su farmacia que se ponga en contacto con nuestra oficina. Nuestro nmero de fax es el [phone].  Si tiene un asunto urgente cuando la clnica est cerrada y que no puede esperar hasta el siguiente da hbil, puede llamar/localizar a su doctor(a) al nmero que aparece a continuacin.   Por favor, tenga en cuenta que aunque hacemos todo lo posible para estar disponibles para asuntos urgentes fuera del horario de oficina, no estamos disponibles las 24 horas del da, los 7 das de la semana.   Si tiene un problema urgente y no puede comunicarse con nosotros, puede optar por buscar atencin mdica  en el consultorio de su doctor(a), en una clnica privada, en un centro de atencin urgente o en una sala de emergencias.  Si tiene una emergencia mdica, por favor llame inmediatamente al 911 o vaya a la sala de emergencias.  Nmeros de bper  - Dr. Kowalski: [phone]  - Dra. Moye: [phone]  - Dra. Stewart: [phone]  En caso de inclemencias del tiempo, por favor llame a nuestra lnea principal al [phone] para una actualizacin sobre el estado de cualquier retraso o cierre.  Consejos para la medicacin en dermatologa: Por favor, guarde las cajas en las que vienen los medicamentos de uso tpico para ayudarle a seguir las instrucciones sobre dnde y cmo usarlos. Las farmacias generalmente imprimen las instrucciones del medicamento slo en las cajas y no directamente en los tubos del medicamento.   Si su medicamento es muy caro, por favor, pngase en contacto con nuestra oficina llamando al [phone] y presione la opcin 4 o envenos un mensaje a travs de MyChart.   No podemos decirle cul ser su copago por los medicamentos por adelantado ya que esto es diferente dependiendo de la cobertura de su seguro. Sin embargo, es posible que podamos encontrar un medicamento sustituto a menor costo o llenar un formulario para que el  seguro cubra el medicamento que se considera necesario.   Si se requiere una autorizacin previa para que su compaa de seguros cubra su medicamento, por favor permtanos de 1 a 2 das hbiles para completar este proceso.  Los precios de los medicamentos varan con frecuencia dependiendo del lugar de dnde se surte la receta y alguna farmacias pueden ofrecer precios ms baratos.  El sitio web www.goodrx.com tiene cupones para medicamentos de diferentes farmacias. Los precios aqu no tienen en cuenta lo que podra costar con la ayuda del seguro (puede ser ms barato con su seguro), pero el sitio web puede darle el precio si no utiliz ningn seguro.  - Puede imprimir el cupn correspondiente y llevarlo con su receta a la farmacia.  - Tambin puede pasar por nuestra oficina durante el horario de atencin regular y recoger una tarjeta de cupones de GoodRx.  - Si necesita que su receta se enve electrnicamente a una farmacia diferente, informe a nuestra oficina a travs de MyChart de [place]   o por telfono llamando al [phone] y presione la opcin 4.  

## 2022-07-28 NOTE — Progress Notes (Signed)
   Follow-Up Visit   Subjective  Deborah Pierce is a 32 y.o. female who presents for the following: Suture removal S/P excision bx proven severely dyplastic nevus, margins free, of the R mid to upper back paraspinal. Bx proven mildly dysplastic nevus of the L lat buttock, re-pigmented, patient is here today for shave removal.  Pathology showed a margins free severely dysplastic nevus on the R to mid upper back paraspinal.  The following portions of the chart were reviewed this encounter and updated as appropriate: medications, allergies, medical history  Review of Systems:  No other skin or systemic complaints except as noted in HPI or Assessment and Plan.  Objective  Well appearing patient in no apparent distress; mood and affect are within normal limits.  Areas Examined:  The back and L buttocks  Relevant physical exam findings are noted in the Assessment and Plan.  L lat buttock 0.4 cm area of repigmentation.    Assessment & Plan   Dysplastic nevus L lat buttock  Bx proven mildly dysplastic nevus 02/20/22, re-pigmented  Epidermal / dermal shaving - L lat buttock  Lesion diameter (cm):  0.4 Informed consent: discussed and consent obtained   Timeout: patient name, date of birth, surgical site, and procedure verified   Procedure prep:  Patient was prepped and draped in usual sterile fashion Prep type:  Isopropyl alcohol Anesthesia: the lesion was anesthetized in a standard fashion   Anesthetic:  1% lidocaine w/ epinephrine 1-100,000 buffered w/ 8.4% NaHCO3 Instrument used: flexible razor blade   Hemostasis achieved with: pressure, aluminum chloride and electrodesiccation   Outcome: patient tolerated procedure well   Post-procedure details: sterile dressing applied and wound care instructions given   Dressing type: bandage and petrolatum     Encounter for Removal of Sutures - R to mid upper back paraspinal - Incision site is clean, dry and intact. - Wound cleansed,  sutures removed, wound cleansed and steri strips applied.  - Discussed pathology results showing a margins free severely dysplastic nevus  - Patient advised to keep steri-strips dry until they fall off. - Scars remodel for a full year. - Once steri-strips fall off, patient can apply over-the-counter silicone scar cream once to twice a day to help with scar remodeling if desired. - Patient advised to call with any concerns or if they notice any new or changing lesions.  Dysplastic nevus - bx proven severe, L mid back paraspinal Patient scheduled for surgical excision on 08/18/22.   Return for appointment as scheduled for excision of severely dysplastic nevus on the L mid back paraspinal.  I, Cari Caraway, CMA, am acting as scribe for Armida Sans, MD .  Documentation: I have reviewed the above documentation for accuracy and completeness, and I agree with the above.  Armida Sans, MD

## 2022-08-05 ENCOUNTER — Encounter: Payer: Self-pay | Admitting: Dermatology

## 2022-08-06 ENCOUNTER — Other Ambulatory Visit: Payer: Self-pay | Admitting: Family Medicine

## 2022-08-06 DIAGNOSIS — F418 Other specified anxiety disorders: Secondary | ICD-10-CM

## 2022-08-09 ENCOUNTER — Encounter: Payer: Self-pay | Admitting: Dermatology

## 2022-08-18 ENCOUNTER — Encounter: Payer: Self-pay | Admitting: Dermatology

## 2022-08-18 ENCOUNTER — Ambulatory Visit: Payer: No Typology Code available for payment source | Admitting: Dermatology

## 2022-08-18 DIAGNOSIS — D225 Melanocytic nevi of trunk: Secondary | ICD-10-CM | POA: Diagnosis not present

## 2022-08-18 DIAGNOSIS — D492 Neoplasm of unspecified behavior of bone, soft tissue, and skin: Secondary | ICD-10-CM

## 2022-08-18 NOTE — Patient Instructions (Signed)

## 2022-08-18 NOTE — Progress Notes (Unsigned)
   Follow-Up Visit   Subjective  Deborah Pierce is a 32 y.o. 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 Here for excision).  The following portions of the chart were reviewed this encounter and updated as appropriate:  Tobacco  Allergies  Meds  Problems  Med Hx  Surg Hx  Fam Hx     Review of Systems: No other skin or systemic complaints except as noted in HPI or Assessment and Plan.  Objective  Well appearing patient in no apparent distress; mood and affect are within normal limits.  A focused examination was performed including back. Relevant physical exam findings are noted in the Assessment and Plan.  L mid back paraspinal 1.2 x 0.6 cm pink healing biopsy site   Assessment & Plan  Neoplasm of skin L mid back paraspinal  Skin excision  Lesion length (cm):  1.2 Lesion width (cm):  0.6 Margin per side (cm):  0.2 Total excision diameter (cm):  1.6 Informed consent: discussed and consent obtained   Timeout: patient name, date of birth, surgical site, and procedure verified   Procedure prep:  Patient was prepped and draped in usual sterile fashion Prep type:  Isopropyl alcohol and povidone-iodine Anesthesia: the lesion was anesthetized in a standard fashion   Anesthetic:  1% lidocaine w/ epinephrine 1-100,000 buffered w/ 8.4% NaHCO3 Instrument used: #15 blade   Hemostasis achieved with: pressure   Hemostasis achieved with comment:  Electrocautery Outcome: patient tolerated procedure well with no complications   Post-procedure details: sterile dressing applied and wound care instructions given   Dressing type: bandage and pressure dressing (mupirocin)    Skin repair Complexity:  Complex Final length (cm):  3 Reason for type of repair: reduce tension to allow closure, reduce the risk of dehiscence, infection, and necrosis, reduce subcutaneous dead space and avoid a hematoma, allow closure of the large defect, preserve normal  anatomy, preserve normal anatomical and functional relationships and enhance both functionality and cosmetic results   Undermining: area extensively undermined   Undermining comment:  Undermining defect  Subcutaneous layers (deep stitches):  Suture size:  2-0 Suture type: Vicryl (polyglactin 910)   Stitches:  Buried horizontal mattress (inverted dermal) Fine/surface layer approximation (top stitches):  Suture size:  2-0 Suture type: nylon   Stitches: vertical mattress   Suture removal (days):  7 Hemostasis achieved with: suture and pressure Outcome: patient tolerated procedure well with no complications   Post-procedure details: sterile dressing applied and wound care instructions given   Dressing type: bandage and pressure dressing (mupirocin)    Specimen 1 - Surgical pathology Differential Diagnosis: R/O residual dysplastic nevus  Check Margins: Yes  Previous Bx: [id number]  Related Medications mupirocin ointment (BACTROBAN) 2 % Apply 1 Application topically daily. Qd to excision site   Return in about 1 week (around 08/25/2022) for Suture Removal.  I, Lawson Radar, CMA, am acting as scribe for Armida Sans, MD. Documentation: I have reviewed the above documentation for accuracy and completeness, and I agree with the above.  Armida Sans, MD

## 2022-08-19 ENCOUNTER — Telehealth: Payer: Self-pay

## 2022-08-19 NOTE — Telephone Encounter (Signed)
Patient doing well after surgery yesterday. Call if any concerns.

## 2022-08-20 ENCOUNTER — Encounter: Payer: Self-pay | Admitting: Dermatology

## 2022-08-25 ENCOUNTER — Ambulatory Visit: Payer: No Typology Code available for payment source | Admitting: Dermatology

## 2022-08-25 ENCOUNTER — Encounter: Payer: No Typology Code available for payment source | Admitting: Dermatology

## 2022-08-25 DIAGNOSIS — Z86018 Personal history of other benign neoplasm: Secondary | ICD-10-CM

## 2022-08-25 NOTE — Patient Instructions (Signed)
Due to recent changes in healthcare laws, you may see results of your pathology and/or laboratory studies on MyChart before the doctors have had a chance to review them. We understand that in some cases there may be results that are confusing or concerning to you. Please understand that not all results are received at the same time and often the doctors may need to interpret multiple results in order to provide you with the best plan of care or course of treatment. Therefore, we ask that you please give us 2 business days to thoroughly review all your results before contacting the office for clarification. Should we see a critical lab result, you will be contacted sooner.   If You Need Anything After Your Visit  If you have any questions or concerns for your doctor, please call our main line at [phone] and press option 4 to reach your doctor's medical assistant. If no one answers, please leave a voicemail as directed and we will return your call as soon as possible. Messages left after 4 pm will be answered the following business day.   You may also send us a message via MyChart. We typically respond to MyChart messages within 1-2 business days.  For prescription refills, please ask your pharmacy to contact our office. Our fax number is [phone].  If you have an urgent issue when the clinic is closed that cannot wait until the next business day, you can page your doctor at the number below.    Please note that while we do our best to be available for urgent issues outside of office hours, we are not available 24/7.   If you have an urgent issue and are unable to reach us, you may choose to seek medical care at your doctor's office, retail clinic, urgent care center, or emergency room.  If you have a medical emergency, please immediately call 911 or go to the emergency department.  Pager Numbers  - Dr. Kowalski: [phone]  - Dr. Moye: [phone]  - Dr. Stewart:  [phone]  In the event of inclement weather, please call our main line at [phone] for an update on the status of any delays or closures.  Dermatology Medication Tips: Please keep the boxes that topical medications come in in order to help keep track of the instructions about where and how to use these. Pharmacies typically print the medication instructions only on the boxes and not directly on the medication tubes.   If your medication is too expensive, please contact our office at [phone] option 4 or send us a message through MyChart.   We are unable to tell what your co-pay for medications will be in advance as this is different depending on your insurance coverage. However, we may be able to find a substitute medication at lower cost or fill out paperwork to get insurance to cover a needed medication.   If a prior authorization is required to get your medication covered by your insurance company, please allow us 1-2 business days to complete this process.  Drug prices often vary depending on where the prescription is filled and some pharmacies may offer cheaper prices.  The website www.goodrx.com contains coupons for medications through different pharmacies. The prices here do not account for what the cost may be with help from insurance (it may be cheaper with your insurance), but the website can give you the price if you did not use any insurance.  - You can print the associated coupon and take it with   your prescription to the pharmacy.  - You may also stop by our office during regular business hours and pick up a GoodRx coupon card.  - If you need your prescription sent electronically to a different pharmacy, notify our office through [facility] MyChart or by phone at [phone] option 4.     Si Usted Necesita Algo Despus de Su Visita  Tambin puede enviarnos un mensaje a travs de MyChart. Por lo general respondemos a los mensajes de MyChart en el transcurso de 1 a 2  das hbiles.  Para renovar recetas, por favor pida a su farmacia que se ponga en contacto con nuestra oficina. Nuestro nmero de fax es el [phone].  Si tiene un asunto urgente cuando la clnica est cerrada y que no puede esperar hasta el siguiente da hbil, puede llamar/localizar a su doctor(a) al nmero que aparece a continuacin.   Por favor, tenga en cuenta que aunque hacemos todo lo posible para estar disponibles para asuntos urgentes fuera del horario de oficina, no estamos disponibles las 24 horas del da, los 7 das de la semana.   Si tiene un problema urgente y no puede comunicarse con nosotros, puede optar por buscar atencin mdica  en el consultorio de su doctor(a), en una clnica privada, en un centro de atencin urgente o en una sala de emergencias.  Si tiene una emergencia mdica, por favor llame inmediatamente al 911 o vaya a la sala de emergencias.  Nmeros de bper  - Dr. Kowalski: [phone]  - Dra. Moye: [phone]  - Dra. Stewart: [phone]  En caso de inclemencias del tiempo, por favor llame a nuestra lnea principal al [phone] para una actualizacin sobre el estado de cualquier retraso o cierre.  Consejos para la medicacin en dermatologa: Por favor, guarde las cajas en las que vienen los medicamentos de uso tpico para ayudarle a seguir las instrucciones sobre dnde y cmo usarlos. Las farmacias generalmente imprimen las instrucciones del medicamento slo en las cajas y no directamente en los tubos del medicamento.   Si su medicamento es muy caro, por favor, pngase en contacto con nuestra oficina llamando al [phone] y presione la opcin 4 o envenos un mensaje a travs de MyChart.   No podemos decirle cul ser su copago por los medicamentos por adelantado ya que esto es diferente dependiendo de la cobertura de su seguro. Sin embargo, es posible que podamos encontrar un medicamento sustituto a menor costo o llenar un formulario para que el  seguro cubra el medicamento que se considera necesario.   Si se requiere una autorizacin previa para que su compaa de seguros cubra su medicamento, por favor permtanos de 1 a 2 das hbiles para completar este proceso.  Los precios de los medicamentos varan con frecuencia dependiendo del lugar de dnde se surte la receta y alguna farmacias pueden ofrecer precios ms baratos.  El sitio web www.goodrx.com tiene cupones para medicamentos de diferentes farmacias. Los precios aqu no tienen en cuenta lo que podra costar con la ayuda del seguro (puede ser ms barato con su seguro), pero el sitio web puede darle el precio si no utiliz ningn seguro.  - Puede imprimir el cupn correspondiente y llevarlo con su receta a la farmacia.  - Tambin puede pasar por nuestra oficina durante el horario de atencin regular y recoger una tarjeta de cupones de GoodRx.  - Si necesita que su receta se enve electrnicamente a una farmacia diferente, informe a nuestra oficina a travs de MyChart de [facility]   o por telfono llamando al [phone] y presione la opcin 4.  

## 2022-08-25 NOTE — Progress Notes (Signed)
   Follow-Up Visit   Subjective  Deborah Pierce is a 32 y.o. female who presents for the following: Suture removal of the left mid back paraspinal   Pathology showed RESIDUAL DYSPLASTIC NEVUS, MARGIN FREE   The following portions of the chart were reviewed this encounter and updated as appropriate: medications, allergies, medical history  Review of Systems:  No other skin or systemic complaints except as noted in HPI or Assessment and Plan.  Objective  Well appearing patient in no apparent distress; mood and affect are within normal limits.  Areas Examined: left mid back paraspinal   Relevant physical exam findings are noted in the Assessment and Plan.   Assessment & Plan   Encounter for Removal of Sutures - Incision site is clean, dry and intact. - Wound cleansed, sutures removed, wound cleansed and steri strips applied.  - Discussed pathology results showing RESIDUAL DYSPLASTIC NEVUS, MARGIN FREE  - Patient advised to keep steri-strips dry until they fall off. - Scars remodel for a full year. - Once steri-strips fall off, patient can apply over-the-counter silicone scar cream once to twice a day to help with scar remodeling if desired. - Patient advised to call with any concerns or if they notice any new or changing lesions.  Return in about 6 months (around 02/25/2023) for TBSE, hx of Dysplastic nevus .  IAngelique Holm, CMA, am acting as scribe for Armida Sans, MD .   Documentation: I have reviewed the above documentation for accuracy and completeness, and I agree with the above.  Armida Sans, MD

## 2022-08-30 ENCOUNTER — Ambulatory Visit
Admission: EM | Admit: 2022-08-30 | Discharge: 2022-08-30 | Disposition: A | Discharge status: Home / Self Care | Payer: No Typology Code available for payment source | Attending: Emergency Medicine | Admitting: Emergency Medicine

## 2022-08-30 DIAGNOSIS — H1031 Unspecified acute conjunctivitis, right eye: Secondary | ICD-10-CM | POA: Diagnosis not present

## 2022-08-30 DIAGNOSIS — L03213 Periorbital cellulitis: Secondary | ICD-10-CM

## 2022-08-30 MED ORDER — ERYTHROMYCIN 5 MG/GM OP OINT: TOPICAL_OINTMENT | OPHTHALMIC | 0 refills | Status: DC

## 2022-08-30 MED ORDER — CEFDINIR 300 MG PO CAPS: 300.0000 mg | ORAL_CAPSULE | Freq: Two times a day (BID) | ORAL | 0 refills | Status: AC

## 2022-08-30 NOTE — Discharge Instructions (Addendum)
Take the cefdinir and use the erythromycin eye ointment as directed.    Follow-up with your primary care provider.    Go to the emergency department if you have acute eye pain, changes in your vision, or other concerning symptoms.

## 2022-08-30 NOTE — ED Triage Notes (Signed)
Patient presents to UC for right eye stye, blurriness x 3 days. Treating with warm/cold compresses, cleansing with baby soap. No improvement.

## 2022-08-30 NOTE — ED Provider Notes (Signed)
[name]    CSN: [id number] Arrival date & time: 08/30/22  1236      History   Chief Complaint Chief Complaint  Patient presents with   Eye Problem    HPI Deborah Pierce is a 32 y.o. female.  Patient presents with right eye redness and drainage x 3 days.  She has been treating it with warm compresses, cold compresses, baby soap.  She denies fever, eye pain, change in vision, or other symptoms.  She is breast-feeding her infant.  Her medical history includes diabetes.  The history is provided by the patient and medical records.    Past Medical History:  Diagnosis Date   Anxiety    Depression    Diabetes mellitus without complication (HCC)    Dysplastic nevus 02/20/2022   Severe, R to mid upper back paraspinal, Excised 07/21/22   Dysplastic nevus 02/20/2022   Severe, L mid back paraspinal, Excised 08/18/22   Dysplastic nevus 02/20/2022   mild, L lateral buttocks   Fractured elbow 2022   rt   Headache    Other fatigue 08/12/2021   Pregnancy induced hypertension 05/11/2022   Pt states they just noticed about a week ago.   Seasonal allergies 08/07/2011   UTI (urinary tract infection)     Patient Active Problem List   Diagnosis Date Noted   Dysplastic nevus 04/16/2022   BMI 37.0-37.9, adult 04/16/2022   Mixed hyperlipidemia 12/17/2021   Vitamin D deficiency 12/17/2021   Controlled type 2 diabetes mellitus without complication, with long-term current use of insulin (HCC) 08/12/2021   Diabetic peripheral neuropathy (HCC) 08/12/2021   Depression with anxiety 08/12/2021    Past Surgical History:  Procedure Laterality Date   CESAREAN SECTION N/A 05/29/2022   Procedure: CESAREAN SECTION;  Surgeon: Kathrynn Running, MD;  Location: [facility];  Service: Obstetrics;  Laterality: N/A;   LAPAROSCOPY     looking for endometriosis    OB History     Gravida  2   Para  1   Term  0   Preterm  1   AB  1   Living  1      SAB  1   IAB      Ectopic       Multiple  0   Live Births  1        Obstetric Comments  March 2022 spontaneous abortion/miscarriage          Home Medications    Prior to Admission medications   Medication Sig Start Date End Date Taking? Authorizing Provider  cefdinir (OMNICEF) 300 MG capsule Take 1 capsule (300 mg total) by mouth 2 (two) times daily for 10 days. 08/30/22 09/09/22 Yes Mickie Bail, NP  erythromycin ophthalmic ointment Place a 1/2 inch ribbon of ointment into the lower eyelid four times a day for 7 days. 08/30/22  Yes Mickie Bail, NP  cetirizine (ZYRTEC ALLERGY) 10 MG tablet 1 tablet as needed Orally Once a day    [provider]  Cholecalciferol (VITAMIN D) 125 MCG (5000 UT) CAPS 1 capsule Orally Once a day    [provider]  Continuous Blood Gluc Sensor (DEXCOM G6 SENSOR) MISC Apply topically as directed. 06/27/22   [provider]  Continuous Blood Gluc Transmit (DEXCOM G6 TRANSMITTER) MISC USE 1 TRANSMITTER TO MONITOR BLOOD SUGARS EVERY 3 MONTHS 90 DAYS    [provider]  Drospirenone (SLYND) 4 MG TABS Take 1 tablet (4 mg total) by  mouth daily. 07/07/22   Anyanwu, Jethro Bastos, MD  FLUoxetine (PROZAC) 40 MG capsule 1 capsule Orally Once a day    [provider]  hydrOXYzine (ATARAX) 25 MG tablet 1 tablet as needed Orally Once a day    [provider]  ibuprofen (ADVIL) 600 MG tablet Take 1 tablet (600 mg total) by mouth every 6 (six) hours. Patient not taking: Reported on 07/07/2022 05/31/22   Adam Phenix, MD  Insulin Aspart FlexPen (NOVOLOG) 100 UNIT/ML Sliding scale insulin Less than 70 initiate hypoglycemia protocol 70-120  0 units 120-150 2 units 151-200 3 units 201-250 5units 251-300 8 units 301-350 11 units 351-400 15 units Greater than 400 call MD 04/22/20   [provider]  Insulin Disposable Pump (OMNIPOD 5 G6 INTRO, GEN 5,) KIT Use for continuous insulin delivery daily for 365 days 01/21/22   [provider]   Insulin Disposable Pump (OMNIPOD 5 G6 PODS, GEN 5,) MISC SMARTSIG:SUB-Q Every Other Day    [provider]  Insulin Human (INSULIN PUMP) SOLN Inject 1 each into the skin 3 times daily with meals, bedtime and 2 AM. 05/31/22   Adam Phenix, MD  metFORMIN (GLUCOPHAGE) 1000 MG tablet TAKE 1 TABLET (1,000 MG TOTAL) BY MOUTH TWICE A DAY WITH FOOD 07/20/22   Anyanwu, Jethro Bastos, MD  mupirocin ointment (BACTROBAN) 2 % Apply 1 Application topically daily. Qd to excision site 07/21/22   Deirdre Evener, MD  NIFEdipine (PROCARDIA-XL/NIFEDICAL-XL) 30 MG 24 hr tablet Take 30 mg by mouth daily.    [provider]  norethindrone (MICRONOR) 0.35 MG tablet Take 1 tablet (0.35 mg total) by mouth daily. 07/07/22   Anyanwu, Jethro Bastos, MD  NOVOLOG 100 UNIT/ML injection use up to 100 units daily subcutaneously daily in the pump for 30 days Ok to sub Humalog if insurance prefers 02/04/22   [provider]  oxyCODONE (OXY IR/ROXICODONE) 5 MG immediate release tablet Take 1 tablet (5 mg total) by mouth every 4 (four) hours as needed for moderate pain. Patient not taking: Reported on 07/07/2022 05/31/22   Adam Phenix, MD  Prenatal 28-0.8 MG TABS 1 tablet Orally Once a day    [provider]  TRESIBA FLEXTOUCH 200 UNIT/ML FlexTouch Pen START WITH 40 UNITS DAILY AND GRADUALLY TITRATE UP AS DIRECTED TO 70 UNITS DAILY. 09/16/20   [provider]    Family History Family History  Problem Relation Age of Onset   Hypertension Mother    Heart disease Mother    Cancer Mother        melanoma   Diabetes Mother    Diabetes type II Mother    Melanoma Mother    Diabetes Sister    Heart disease Maternal Grandmother    Hypertension Maternal Grandmother    Diabetes Maternal Grandmother    Cancer Maternal Grandfather    Lung cancer Maternal Grandfather    Brain cancer Maternal Grandfather    Asthma Neg Hx     Social History Social History   Tobacco Use   Smoking status: Never    Smokeless tobacco: Never  Vaping Use   Vaping Use: Never used  Substance Use Topics   Alcohol use: Not Currently   Drug use: Not Currently    Types: Marijuana    Comment: daily with anxiety; but not since preg     Allergies   Fruit extracts, Insulins, Tree extract, Bee venom, Insulin degludec-liraglutide, Insulin glargine-lixisenatide, Penicillins, and Sulfa antibiotics   Review of  Systems Review of Systems  Constitutional:  Negative for chills and fever.  HENT:  Negative for ear pain and sore throat.   Eyes:  Positive for discharge and redness. Negative for pain, itching and visual disturbance.  Respiratory:  Negative for cough and shortness of breath.   Cardiovascular:  Negative for chest pain and palpitations.     Physical Exam Triage Vital Signs ED Triage Vitals  Enc Vitals Group     BP 08/30/22 1251 (!) 142/75     Pulse Rate 08/30/22 1251 99     Resp 08/30/22 1251 18     Temp 08/30/22 1251 97.6 F (36.4 C)     Temp Source 08/30/22 1251 Temporal     SpO2 08/30/22 1251 95 %     Weight --      Height --      Head Circumference --      Peak Flow --      Pain Score 08/30/22 1253 3     Pain Loc --      Pain Edu? --      Excl. in GC? --    No data found.  Updated Vital Signs BP (!) 142/75 (BP Location: Left Arm)   Pulse 99   Temp 97.6 F (36.4 C) (Temporal)   Resp 18   SpO2 95%   Breastfeeding Yes   Visual Acuity Right Eye Distance: 20/85 Left Eye Distance: 20/25 Bilateral Distance: 20/30 (w/corrective lenses)  Right Eye Near:   Left Eye Near:    Bilateral Near:     Physical Exam Vitals and nursing note reviewed.  Constitutional:      General: She is not in acute distress.    Appearance: Normal appearance. She is well-developed. She is not ill-appearing.  HENT:     Mouth/Throat:     Mouth: Mucous membranes are moist.  Eyes:     General: Vision grossly intact.        Left eye: No discharge.     Extraocular Movements: Extraocular movements  intact.     Conjunctiva/sclera:     Right eye: Right conjunctiva is injected.     Pupils: Pupils are equal, round, and reactive to light.  Cardiovascular:     Rate and Rhythm: Normal rate and regular rhythm.  Pulmonary:     Effort: Pulmonary effort is normal. No respiratory distress.  Musculoskeletal:     Cervical back: Neck supple.  Skin:    General: Skin is warm and dry.  Neurological:     Mental Status: She is alert.  Psychiatric:        Mood and Affect: Mood normal.        Behavior: Behavior normal.         UC Treatments / Results  Labs (all labs ordered are listed, but only abnormal results are displayed) Labs Reviewed - No data to display  EKG   Radiology No results found.  Procedures Procedures (including critical care time)  Medications Ordered in UC Medications - No data to display  Initial Impression / Assessment and Plan / UC Course  I have reviewed the triage vital signs and the nursing notes.  Pertinent labs & imaging results that were available during my care of the patient were reviewed by me and considered in my medical decision making (see chart for details).    Preseptal cellulitis of right lower eyelid, acute bacterial conjunctivitis. Lactating mother.  Afebrile and vital signs are stable.  Treating with cefdinir and erythromycin eye ointment.  ED precautions discussed.  Instructed patient to follow-up with her PCP tomorrow.  Education provided on preseptal cellulitis and bacterial conjunctivitis.  She agrees to plan of care.  Final Clinical Impressions(s) / UC Diagnoses   Final diagnoses:  Preseptal cellulitis of right lower eyelid  Acute bacterial conjunctivitis of right eye  Lactating mother     Discharge Instructions      Take the cefdinir and use the erythromycin eye ointment as directed.    Follow-up with your primary care provider.    Go to the emergency department if you have acute eye pain, changes in your vision, or other  concerning symptoms.        ED Prescriptions     Medication Sig Dispense Auth. Provider   cefdinir (OMNICEF) 300 MG capsule Take 1 capsule (300 mg total) by mouth 2 (two) times daily for 10 days. 20 capsule Mickie Bail, NP   erythromycin ophthalmic ointment Place a 1/2 inch ribbon of ointment into the lower eyelid four times a day for 7 days. 3.5 g Mickie Bail, NP      PDMP not reviewed this encounter.   Mickie Bail, NP 08/30/22 1330

## 2022-09-01 ENCOUNTER — Encounter: Payer: Self-pay | Admitting: Dermatology

## 2022-09-01 ENCOUNTER — Encounter: Payer: Self-pay | Admitting: Family Medicine

## 2022-09-01 ENCOUNTER — Encounter: Payer: No Typology Code available for payment source | Admitting: Dermatology

## 2022-10-01 ENCOUNTER — Encounter: Payer: No Typology Code available for payment source | Admitting: Family

## 2022-10-26 ENCOUNTER — Ambulatory Visit (INDEPENDENT_AMBULATORY_CARE_PROVIDER_SITE_OTHER): Payer: No Typology Code available for payment source | Admitting: Family

## 2022-10-26 ENCOUNTER — Encounter: Payer: Self-pay | Admitting: Family

## 2022-10-26 VITALS — BP 132/84 | HR 120 | Ht 67.0 in | Wt 220.4 lb

## 2022-10-26 DIAGNOSIS — E782 Mixed hyperlipidemia: Secondary | ICD-10-CM

## 2022-10-26 DIAGNOSIS — Z794 Long term (current) use of insulin: Secondary | ICD-10-CM | POA: Diagnosis not present

## 2022-10-26 DIAGNOSIS — F418 Other specified anxiety disorders: Secondary | ICD-10-CM

## 2022-10-26 DIAGNOSIS — E119 Type 2 diabetes mellitus without complications: Secondary | ICD-10-CM | POA: Diagnosis not present

## 2022-10-26 DIAGNOSIS — E66811 Obesity, class 1: Secondary | ICD-10-CM

## 2022-10-26 DIAGNOSIS — Z0000 Encounter for general adult medical examination without abnormal findings: Secondary | ICD-10-CM | POA: Diagnosis not present

## 2022-10-26 DIAGNOSIS — E559 Vitamin D deficiency, unspecified: Secondary | ICD-10-CM | POA: Diagnosis not present

## 2022-10-26 DIAGNOSIS — Z789 Other specified health status: Secondary | ICD-10-CM | POA: Diagnosis not present

## 2022-10-26 DIAGNOSIS — E6609 Other obesity due to excess calories: Secondary | ICD-10-CM

## 2022-10-26 DIAGNOSIS — Z6834 Body mass index (BMI) 34.0-34.9, adult: Secondary | ICD-10-CM

## 2022-10-26 LAB — COMPREHENSIVE METABOLIC PANEL
ALT: 58 U/L — ABNORMAL HIGH (ref 0–35)
AST: 35 U/L (ref 0–37)
Albumin: 4.5 g/dL (ref 3.5–5.2)
Alkaline Phosphatase: 108 U/L (ref 39–117)
BUN: 11 mg/dL (ref 6–23)
CO2: 22 mEq/L (ref 19–32)
Calcium: 9.9 mg/dL (ref 8.4–10.5)
Chloride: 97 mEq/L (ref 96–112)
Creatinine, Ser: 0.79 mg/dL (ref 0.40–1.20)
GFR: 99.56 mL/min (ref >60.00)
Glucose, Bld: 310 mg/dL — ABNORMAL HIGH (ref 70–99)
Potassium: 3.9 mEq/L (ref 3.5–5.1)
Sodium: 134 mEq/L — ABNORMAL LOW (ref 135–145)
Total Bilirubin: 0.4 mg/dL (ref 0.2–1.2)
Total Protein: 7.7 g/dL (ref 6.0–8.3)

## 2022-10-26 LAB — HEMOGLOBIN A1C: Hgb A1c MFr Bld: 12.5 % — ABNORMAL HIGH (ref 4.6–6.5)

## 2022-10-26 LAB — VITAMIN D 25 HYDROXY (VIT D DEFICIENCY, FRACTURES): VITD: 19.13 ng/mL — ABNORMAL LOW (ref 30.00–100.00)

## 2022-10-26 LAB — CBC
HCT: 41.8 % (ref 36.0–46.0)
Hemoglobin: 14.2 g/dL (ref 12.0–15.0)
MCHC: 34 g/dL (ref 30.0–36.0)
MCV: 81.6 fl (ref 78.0–100.0)
Platelets: 451 10*3/uL — ABNORMAL HIGH (ref 150.0–400.0)
RBC: 5.12 Mil/uL — ABNORMAL HIGH (ref 3.87–5.11)
RDW: 15.3 % (ref 11.5–15.5)
WBC: 10.6 10*3/uL — ABNORMAL HIGH (ref 4.0–10.5)

## 2022-10-26 LAB — MICROALBUMIN / CREATININE URINE RATIO
Creatinine,U: 95.7 mg/dL
Microalb Creat Ratio: 46 mg/g — ABNORMAL HIGH (ref 0.0–30.0)
Microalb, Ur: 44 mg/dL — ABNORMAL HIGH (ref 0.0–1.9)

## 2022-10-26 LAB — LDL CHOLESTEROL, DIRECT: Direct LDL: 65 mg/dL

## 2022-10-26 LAB — LIPID PANEL
Cholesterol: 231 mg/dL — ABNORMAL HIGH (ref 0–200)
HDL: 39.1 mg/dL (ref >39.00)
Total CHOL/HDL Ratio: 6
Triglycerides: 1673 mg/dL — ABNORMAL HIGH (ref 0.0–149.0)

## 2022-10-26 MED ORDER — BUSPIRONE HCL 5 MG PO TABS: 5.0000 mg | ORAL_TABLET | Freq: Two times a day (BID) | ORAL | 2 refills | Status: DC

## 2022-10-26 NOTE — Assessment & Plan Note (Signed)
 Ordered hga1c today pending results. Work on diabetic diet and exercise as tolerated. Yearly foot exam, and annual eye exam.   

## 2022-10-26 NOTE — Patient Instructions (Signed)
   [facility] Phone # [phone] They have locations in [place], [place], [place], and [place]. They do take Healthy [employer] I know for sure and the [employer].   [address] [phone]  [address] [place], [place], [place] [address]    [facility] [employer] [address] [place], [place] [address]   [phone]   If at any time you feel your needs are more urgent or you are concerned for your well being please see the below options:  For walk in options for mental health:   [facility] [address] in [place], [place]. Call our 24-hour HelpLine at [phone] or [phone] for immediate assistance for mental health and substance abuse issues.  And or walk into [facility] hospital ER.   [employer]: 1-800-SUICIDE  The [employer] Suicide Prevention Lifeline: 1-800-273-TALK  Regards,   Mort Sawyers FNP-C

## 2022-10-26 NOTE — Assessment & Plan Note (Addendum)
One study suggested that buspirone not in breast milk, pt does state despite risks explained would like to try buspirone.   Did d/w pt finding therapist.  Continue prozac 40 mg once daily.  Sent message to Dr. Shawnie Pons as well to verify prior to pt starting buspirone that ok with breastfeeding for collaborative agreement. Sent in buspirone 5 mg bid but awaiting reply from Dr. Shawnie Pons, pt advised pending prior to proceeding.

## 2022-10-26 NOTE — Assessment & Plan Note (Signed)
Pt advised to work on diet and exercise as tolerated  

## 2022-10-26 NOTE — Progress Notes (Signed)
Subjective:  Patient ID: Deborah Pierce, female    DOB: Nov 27, 1990  Age: 32 y.o. MRN: [id number]  Patient Care Team: Mort Sawyers, FNP as PCP - General (Family Medicine) Reva Bores, MD as PCP - OBGYN (Obstetrics and Gynecology)   CC:  Chief Complaint  Patient presents with   Annual Exam    HPI Deborah Pierce is a 32 y.o. female who presents today for an annual physical exam. She reports consuming a general diet.  Not currently exercising  She generally feels well. She reports sleeping well. She does not have additional problems to discuss today.   Vision:Within last year Dental:Receives regular dental care STD:The patient denies history of sexually transmitted disease.   Mammogram:  Last pap:  Colonoscopy: Bone density scan:  Pt is with acute concerns.  On hydroxyxine but not really taking because it might cause supply of breastmilk to drop She does state her gyn had recommended buspirone but she needs to take appt.     Advanced Directives Patient does not have advanced directives  DEPRESSION SCREENING    10/26/2022   10:56 AM 12/17/2021    3:13 PM 08/12/2021   11:33 AM  PHQ 2/9 Scores  PHQ - 2 Score 2 0 2  PHQ- 9 Score 8 4 8      ROS: Negative unless specifically indicated above in HPI.    Current Outpatient Medications:    busPIRone (BUSPAR) 5 MG tablet, Take 1 tablet (5 mg total) by mouth 2 (two) times daily., Disp: 60 tablet, Rfl: 2   cetirizine (ZYRTEC ALLERGY) 10 MG tablet, 1 tablet as needed Orally Once a day, Disp: , Rfl:    Cholecalciferol (VITAMIN D) 125 MCG (5000 UT) CAPS, 1 capsule Orally Once a day, Disp: , Rfl:    Continuous Blood Gluc Sensor (DEXCOM G6 SENSOR) MISC, Apply topically as directed., Disp: , Rfl:    Continuous Blood Gluc Transmit (DEXCOM G6 TRANSMITTER) MISC, USE 1 TRANSMITTER TO MONITOR BLOOD SUGARS EVERY 3 MONTHS 90 DAYS, Disp: , Rfl:    FLUoxetine (PROZAC) 40 MG capsule, TAKE 1 CAPSULE (40 MG TOTAL) BY MOUTH DAILY., Disp: 90  capsule, Rfl: 1   hydrOXYzine (ATARAX) 25 MG tablet, 1 tablet as needed Orally Once a day, Disp: , Rfl:    Insulin Aspart FlexPen (NOVOLOG) 100 UNIT/ML, Sliding scale insulin Less than 70 initiate hypoglycemia protocol 70-120  0 units 120-150 2 units 151-200 3 units 201-250 5units 251-300 8 units 301-350 11 units 351-400 15 units Greater than 400 call MD, Disp: , Rfl:    Insulin Disposable Pump (OMNIPOD 5 G6 PODS, GEN 5,) MISC, SMARTSIG:SUB-Q Every Other Day, Disp: , Rfl:    metFORMIN (GLUCOPHAGE) 1000 MG tablet, TAKE 1 TABLET (1,000 MG TOTAL) BY MOUTH TWICE A DAY WITH FOOD, Disp: 180 tablet, Rfl: 1   NIFEdipine (PROCARDIA-XL/NIFEDICAL-XL) 30 MG 24 hr tablet, Take 30 mg by mouth daily., Disp: , Rfl:    norethindrone (MICRONOR) 0.35 MG tablet, Take 1 tablet (0.35 mg total) by mouth daily., Disp: 28 tablet, Rfl: 11   NOVOLOG 100 UNIT/ML injection, use up to 100 units daily subcutaneously daily in the pump for 30 days Ok to sub Humalog if insurance prefers, Disp: , Rfl:    Prenatal 28-0.8 MG TABS, 1 tablet Orally Once a day, Disp: , Rfl:    TRESIBA FLEXTOUCH 200 UNIT/ML FlexTouch Pen, START WITH 40 UNITS DAILY AND GRADUALLY TITRATE UP AS DIRECTED TO 70 UNITS DAILY., Disp: , Rfl:    Drospirenone (SLYND) 4  MG TABS, Take 1 tablet (4 mg total) by mouth daily. (Patient not taking: Reported on 10/26/2022), Disp: 28 tablet, Rfl: 12    Objective:    BP 132/84   Pulse (!) 120   Ht 5\' 7"  (1.702 m)   Wt 220 lb 6.4 oz (100 kg)   LMP 10/15/2022   SpO2 97%   Breastfeeding Yes   BMI 34.52 kg/m   BP Readings from Last 3 Encounters:  10/26/22 132/84  08/30/22 (!) 142/75  07/21/22 122/76      Physical Exam Constitutional:      General: She is not in acute distress.    Appearance: Normal appearance. She is obese. She is not ill-appearing.  HENT:     Head: Normocephalic.     Right Ear: Tympanic membrane normal.     Left Ear: Tympanic membrane normal.     Nose: Nose normal.     Mouth/Throat:      Mouth: Mucous membranes are moist.  Eyes:     Extraocular Movements: Extraocular movements intact.     Pupils: Pupils are equal, round, and reactive to light.  Cardiovascular:     Rate and Rhythm: Normal rate and regular rhythm.  Pulmonary:     Effort: Pulmonary effort is normal.     Breath sounds: Normal breath sounds.  Abdominal:     General: Abdomen is flat. Bowel sounds are normal.     Palpations: Abdomen is soft.     Tenderness: There is no guarding or rebound.  Musculoskeletal:        General: Normal range of motion.     Cervical back: Normal range of motion.  Skin:    General: Skin is warm.     Capillary Refill: Capillary refill takes less than 2 seconds.  Neurological:     General: No focal deficit present.     Mental Status: She is alert.  Psychiatric:        Mood and Affect: Mood normal.        Behavior: Behavior normal.        Thought Content: Thought content normal.        Judgment: Judgment normal.    Diabetic Foot Form - Detailed   Diabetic Foot Exam - detailed Can the patient see the bottom of their feet?: Yes Are the shoes appropriate in style and fit?: Yes Is there swelling or and abnormal foot shape?: No Is there a claw toe deformity?: No Is there elevated skin temparature?: No Is there foot or ankle muscle weakness?: No Normal Range of Motion: Yes Right posterior Tibialias: Present Left posterior Tibialias: Present   Right Dorsalis Pedis: Present Left Dorsalis Pedis: Present  Semmes-Weinstein Monofilament Test R Site 1-Great Toe: Pos L Site 1-Great Toe: Pos               Assessment & Plan:  Breastfeeding (infant)  Controlled type 2 diabetes mellitus without complication, with long-term current use of insulin (HCC) Assessment & Plan: Ordered hga1c today pending results. Work on diabetic diet and exercise as tolerated. Yearly foot exam, and annual eye exam.    Orders: -     Comprehensive metabolic panel -     Hemoglobin A1c -      Microalbumin / creatinine urine ratio  Vitamin D deficiency -     VITAMIN D 25 Hydroxy (Vit-D Deficiency, Fractures)  Mixed hyperlipidemia -     Lipid panel  Class 1 obesity due to excess calories with serious comorbidity and body mass  index (BMI) of 34.0 to 34.9 in adult Assessment & Plan: Pt advised to work on diet and exercise as tolerated    Encounter for general adult medical examination without abnormal findings Assessment & Plan: Patient Counseling(The following topics were reviewed):  Preventative care handout given to pt  Health maintenance and immunizations reviewed. Please refer to Health maintenance section. Pt advised on safe sex, wearing seatbelts in car, and proper nutrition labwork ordered today for annual Dental health: Discussed importance of regular tooth brushing, flossing, and dental visits.   Orders: -     Comprehensive metabolic panel -     CBC -     Hemoglobin A1c  Depression with anxiety Assessment & Plan: One study suggested that buspirone not in breast milk, pt does state despite risks explained would like to try buspirone.   Did d/w pt finding therapist.  Continue prozac 40 mg once daily.  Sent message to Dr. Shawnie Pons as well to verify prior to pt starting buspirone that ok with breastfeeding for collaborative agreement. Sent in buspirone 5 mg bid but awaiting reply from Dr. Shawnie Pons, pt advised pending prior to proceeding.   Orders: -     busPIRone HCl; Take 1 tablet (5 mg total) by mouth 2 (two) times daily.  Dispense: 60 tablet; Refill: 2      Follow-up: Return in about 3 months (around 01/26/2023) for f/u anxiety, f/u depression.   Mort Sawyers, FNP

## 2022-10-26 NOTE — Progress Notes (Signed)
Have pt schedule video visit as soon as able to discuss lab results as multiple concerns we need to discuss.

## 2022-10-26 NOTE — Assessment & Plan Note (Signed)

## 2022-10-27 ENCOUNTER — Telehealth: Payer: Self-pay | Admitting: Family

## 2022-10-27 ENCOUNTER — Encounter: Payer: Self-pay | Admitting: Family

## 2022-10-27 NOTE — Telephone Encounter (Signed)
Patient returned call. I scheduled her for a virtual to discuss lab results.

## 2022-10-27 NOTE — Telephone Encounter (Signed)
 Noted  

## 2022-10-28 ENCOUNTER — Telehealth: Payer: No Typology Code available for payment source | Admitting: Family

## 2022-10-28 ENCOUNTER — Other Ambulatory Visit: Payer: Self-pay | Admitting: Obstetrics & Gynecology

## 2022-10-28 ENCOUNTER — Encounter: Payer: Self-pay | Admitting: Family

## 2022-10-28 VITALS — BP 141/88 | HR 101 | Ht 67.0 in

## 2022-10-28 DIAGNOSIS — O24119 Pre-existing diabetes mellitus, type 2, in pregnancy, unspecified trimester: Secondary | ICD-10-CM

## 2022-10-28 DIAGNOSIS — E559 Vitamin D deficiency, unspecified: Secondary | ICD-10-CM

## 2022-10-28 DIAGNOSIS — E6609 Other obesity due to excess calories: Secondary | ICD-10-CM | POA: Diagnosis not present

## 2022-10-28 DIAGNOSIS — E119 Type 2 diabetes mellitus without complications: Secondary | ICD-10-CM

## 2022-10-28 DIAGNOSIS — E782 Mixed hyperlipidemia: Secondary | ICD-10-CM | POA: Diagnosis not present

## 2022-10-28 DIAGNOSIS — Z794 Long term (current) use of insulin: Secondary | ICD-10-CM

## 2022-10-28 DIAGNOSIS — Z6834 Body mass index (BMI) 34.0-34.9, adult: Secondary | ICD-10-CM | POA: Diagnosis not present

## 2022-10-28 MED ORDER — CHOLECALCIFEROL 1.25 MG (50000 UT) PO TABS: 1.0000 | ORAL_TABLET | ORAL | 0 refills | Status: DC

## 2022-10-28 NOTE — Assessment & Plan Note (Signed)
Triglycerides very elevated.  Will repeat and then plan will be developed from there.  Advised to start fish oils for now. Will take into consideration breast feeding once return lipid  Could also be inflammatory with uncontrolled diabetes

## 2022-10-28 NOTE — Assessment & Plan Note (Signed)
Uncontrolled Pt with endo, I did advise her to reach out to her immediately to inform her of readings.  Also d/w her compliance and to continue on insulin as without it her A1c becomes uncontrolled quite quickly, pt verbalized understanding.

## 2022-10-28 NOTE — Assessment & Plan Note (Signed)
Pt advised to work on diet and exercise as tolerated  

## 2022-10-28 NOTE — Progress Notes (Signed)
Virtual Visit via Video note  I connected with Deborah Pierce on 10/28/22 at home by video and verified that I am speaking with the correct person using two identifiers.The provider, Mort Sawyers, FNP is located in their office at time of visit.  I discussed the limitations, risks, security and privacy concerns of performing an evaluation and management service by video and the availability of in person appointments. I also discussed with the patient that there may be a patient responsible charge related to this service. The patient expressed understanding and agreed to proceed.  Subjective: PCP: Mort Sawyers, FNP  Chief Complaint  Patient presents with   Results    Discuss lab results    HPI  Vitamin d def: currently taking vitamin D3 5000 I/U has been taking for awhile.  Wt Readings from Last 3 Encounters:  10/26/22 220 lb 6.4 oz (100 kg)  07/07/22 238 lb (108 kg)  05/28/22 255 lb 6.4 oz (115.8 kg)   Temp Readings from Last 3 Encounters:  08/30/22 97.6 F (36.4 C) (Temporal)  05/31/22 97.8 F (36.6 C) (Oral)   BP Readings from Last 3 Encounters:  10/28/22 (!) 141/88  10/26/22 132/84  08/30/22 (!) 142/75   Pulse Readings from Last 3 Encounters:  10/28/22 (!) 101  10/26/22 (!) 120  08/30/22 99   Elevated triglycerides, pt does confirm she was fasting.  She does not drink etoh tries to work on diet. Not overly eating fast foods or fried fatty foods. She is still currently breast feeding.   DM2: currently with significant elevation of A1c from 6.0 to 12.5. pt does see endocrinologist she does state she has a f/u at the end of the month. Is not taking the insulin like she was back in march. She just restarted back on insulin once she saw her lab work. She does have a dexcom, she states that she is in average of 300's during the day. A few days ago was 512.   Lab Results  Component Value Date   HGBA1C 12.5 Repeated and verified X2. (H) 10/26/2022    ROS: Per  HPI  Current Outpatient Medications:    busPIRone (BUSPAR) 5 MG tablet, Take 1 tablet (5 mg total) by mouth 2 (two) times daily., Disp: 60 tablet, Rfl: 2   cetirizine (ZYRTEC ALLERGY) 10 MG tablet, 1 tablet as needed Orally Once a day, Disp: , Rfl:    Cholecalciferol (VITAMIN D) 125 MCG (5000 UT) CAPS, 1 capsule Orally Once a day, Disp: , Rfl:    Cholecalciferol 1.25 MG (50000 UT) TABS, Take 1 tablet by mouth once a week., Disp: 8 tablet, Rfl: 0   Continuous Blood Gluc Sensor (DEXCOM G6 SENSOR) MISC, Apply topically as directed., Disp: , Rfl:    Continuous Blood Gluc Transmit (DEXCOM G6 TRANSMITTER) MISC, USE 1 TRANSMITTER TO MONITOR BLOOD SUGARS EVERY 3 MONTHS 90 DAYS, Disp: , Rfl:    Drospirenone (SLYND) 4 MG TABS, Take 1 tablet (4 mg total) by mouth daily., Disp: 28 tablet, Rfl: 12   FLUoxetine (PROZAC) 40 MG capsule, TAKE 1 CAPSULE (40 MG TOTAL) BY MOUTH DAILY., Disp: 90 capsule, Rfl: 1   hydrOXYzine (ATARAX) 25 MG tablet, 1 tablet as needed Orally Once a day, Disp: , Rfl:    Insulin Aspart FlexPen (NOVOLOG) 100 UNIT/ML, Sliding scale insulin Less than 70 initiate hypoglycemia protocol 70-120  0 units 120-150 2 units 151-200 3 units 201-250 5units 251-300 8 units 301-350 11 units 351-400 15 units Greater than 400 call  MD, Disp: , Rfl:    Insulin Disposable Pump (OMNIPOD 5 G6 PODS, GEN 5,) MISC, SMARTSIG:SUB-Q Every Other Day, Disp: , Rfl:    metFORMIN (GLUCOPHAGE) 1000 MG tablet, TAKE 1 TABLET (1,000 MG TOTAL) BY MOUTH TWICE A DAY WITH FOOD, Disp: 180 tablet, Rfl: 1   NIFEdipine (PROCARDIA-XL/NIFEDICAL-XL) 30 MG 24 hr tablet, Take 30 mg by mouth daily., Disp: , Rfl:    norethindrone (MICRONOR) 0.35 MG tablet, Take 1 tablet (0.35 mg total) by mouth daily., Disp: 28 tablet, Rfl: 11   NOVOLOG 100 UNIT/ML injection, use up to 100 units daily subcutaneously daily in the pump for 30 days Ok to sub Humalog if insurance prefers, Disp: , Rfl:    Prenatal 28-0.8 MG TABS, 1 tablet Orally Once a day, Disp:  , Rfl:    TRESIBA FLEXTOUCH 200 UNIT/ML FlexTouch Pen, START WITH 40 UNITS DAILY AND GRADUALLY TITRATE UP AS DIRECTED TO 70 UNITS DAILY., Disp: , Rfl:   Observations/Objective: Physical Exam Constitutional:      General: She is not in acute distress.    Appearance: Normal appearance. She is not ill-appearing.  Pulmonary:     Effort: Pulmonary effort is normal.  Neurological:     General: No focal deficit present.     Mental Status: She is alert and oriented to person, place, and time.  Psychiatric:        Mood and Affect: Mood normal.        Behavior: Behavior normal.        Thought Content: Thought content normal.     Assessment and Plan: Vitamin D deficiency Assessment & Plan: Pt advised to:  Your vitamin D was a bit on the lower range. I will send an RX for vitamin D3 50,000 IU which you will take once weekly for 8 weeks. Once RX is complete, please continue over the counter Vitamin D3 2000 IU once daily.     Orders: -     Cholecalciferol; Take 1 tablet by mouth once a week.  Dispense: 8 tablet; Refill: 0  Mixed hyperlipidemia Assessment & Plan: Triglycerides very elevated.  Will repeat and then plan will be developed from there.  Advised to start fish oils for now. Will take into consideration breast feeding once return lipid  Could also be inflammatory with uncontrolled diabetes  Orders: -     Lipid panel; Future  Controlled type 2 diabetes mellitus without complication, with long-term current use of insulin (HCC) Assessment & Plan: Uncontrolled Pt with endo, I did advise her to reach out to her immediately to inform her of readings.  Also d/w her compliance and to continue on insulin as without it her A1c becomes uncontrolled quite quickly, pt verbalized understanding.     Class 1 obesity due to excess calories with serious comorbidity and body mass index (BMI) of 34.0 to 34.9 in adult Assessment & Plan: Pt advised to work on diet and exercise as  tolerated      Follow Up Instructions: Return in about 3 months (around 01/28/2023) for f/u cholesterol.   I discussed the assessment and treatment plan with the patient. The patient was provided an opportunity to ask questions and all were answered. The patient agreed with the plan and demonstrated an understanding of the instructions.   The patient was advised to call back or seek an in-person evaluation if the symptoms worsen or if the condition fails to improve as anticipated.  The above assessment and management plan was discussed with the patient.  The patient verbalized understanding of and has agreed to the management plan. Patient is aware to call the clinic if symptoms persist or worsen. Patient is aware when to return to the clinic for a follow-up visit. Patient educated on when it is appropriate to go to the emergency department.     Mort Sawyers, MSN, APRN, FNP-C [facility]

## 2022-10-28 NOTE — Assessment & Plan Note (Signed)
Pt advised to:  Your vitamin D was a bit on the lower range. I will send an RX for vitamin D3 50,000 IU which you will take once weekly for 8 weeks. Once RX is complete, please continue over the counter Vitamin D3 2000 IU once daily.

## 2022-10-30 ENCOUNTER — Encounter: Payer: Self-pay | Admitting: Family

## 2022-10-30 DIAGNOSIS — E559 Vitamin D deficiency, unspecified: Secondary | ICD-10-CM

## 2022-10-30 MED ORDER — CHOLECALCIFEROL 1.25 MG (50000 UT) PO TABS
1.0000 | ORAL_TABLET | ORAL | 0 refills | Status: DC
Start: 2022-10-30 — End: 2023-02-04

## 2022-11-05 ENCOUNTER — Other Ambulatory Visit: Payer: No Typology Code available for payment source

## 2022-11-05 ENCOUNTER — Encounter: Payer: Self-pay | Admitting: Family

## 2022-11-05 DIAGNOSIS — E782 Mixed hyperlipidemia: Secondary | ICD-10-CM | POA: Diagnosis not present

## 2022-11-05 DIAGNOSIS — Z13220 Encounter for screening for lipoid disorders: Secondary | ICD-10-CM

## 2022-11-05 DIAGNOSIS — Z79899 Other long term (current) drug therapy: Secondary | ICD-10-CM

## 2022-11-05 LAB — LIPID PANEL
Cholesterol: 176 mg/dL (ref 0–200)
HDL: 43.6 mg/dL (ref >39.00)
NonHDL: 132.02
Total CHOL/HDL Ratio: 4
Triglycerides: 320 mg/dL — ABNORMAL HIGH (ref 0.0–149.0)
VLDL: 64 mg/dL — ABNORMAL HIGH (ref 0.0–40.0)

## 2022-11-05 LAB — LDL CHOLESTEROL, DIRECT: Direct LDL: 105 mg/dL

## 2022-11-10 ENCOUNTER — Encounter: Payer: Self-pay | Admitting: Family

## 2022-11-10 DIAGNOSIS — E782 Mixed hyperlipidemia: Secondary | ICD-10-CM | POA: Diagnosis not present

## 2022-11-10 DIAGNOSIS — E1165 Type 2 diabetes mellitus with hyperglycemia: Secondary | ICD-10-CM | POA: Diagnosis not present

## 2022-11-10 DIAGNOSIS — E1140 Type 2 diabetes mellitus with diabetic neuropathy, unspecified: Secondary | ICD-10-CM | POA: Diagnosis not present

## 2022-11-11 MED ORDER — PRAVASTATIN SODIUM 10 MG PO TABS: 10.0000 mg | ORAL_TABLET | Freq: Every day | ORAL | 3 refills | Status: DC

## 2022-11-17 ENCOUNTER — Other Ambulatory Visit: Payer: Self-pay | Admitting: Family

## 2022-11-17 ENCOUNTER — Telehealth: Payer: Self-pay | Admitting: Family

## 2022-11-17 DIAGNOSIS — E782 Mixed hyperlipidemia: Secondary | ICD-10-CM

## 2022-11-17 DIAGNOSIS — F418 Other specified anxiety disorders: Secondary | ICD-10-CM

## 2022-11-17 MED ORDER — PRAVASTATIN SODIUM 10 MG PO TABS
10.0000 mg | ORAL_TABLET | Freq: Every day | ORAL | 3 refills | Status: DC
Start: 2022-11-17 — End: 2023-11-25

## 2022-11-17 MED ORDER — FLUOXETINE HCL 40 MG PO CAPS
40.0000 mg | ORAL_CAPSULE | Freq: Every day | ORAL | 3 refills | Status: DC
Start: 2022-11-17 — End: 2023-11-25

## 2022-11-17 NOTE — Telephone Encounter (Signed)
Patient called regarding medication FLUoxetine (PROZAC) 40 MG capsule, says it is currently prescribed through her OBGYN. Patient wanted to know if Tabitha could start sending this in for her instead? Please advise patient if needed, thank you.

## 2022-11-17 NOTE — Telephone Encounter (Signed)
Duplicate telephone note.  

## 2022-11-17 NOTE — Telephone Encounter (Signed)
Please see pt request for Fluoxetine being prescribed by Mort Sawyers. Pt currently has a 1 week supply of medication.  Prescription is pended for 90 day supply. Pt has also reported that pravastatin prescription did not go through, this was the day of computer issues.  I've pended this one for refill also.

## 2022-11-17 NOTE — Telephone Encounter (Signed)
Prescription Request  11/17/2022  LOV: 10/26/2022  What is the name of the medication or equipment? pravastatin (PRAVACHOL) 10 MG tablet [[id number]]   Have you contacted your pharmacy to request a refill? Yes   Which pharmacy would you like this sent to?  [id number], [place] - [address] [place] [address] Phone: [phone] Fax: [phone]    Patient notified that their request is being sent to the clinical staff for review and that they should receive a response within 2 business days.   Please advise at Mobile [phone] (mobile)   Pt states she was told by pharmacy that the day Dugal sent in rx, it was the same day some people lost power, which cause the pharmacy to not receive some rxs. Pt states she was told to ask Dugal to resend rx?

## 2022-11-19 ENCOUNTER — Other Ambulatory Visit: Payer: Self-pay | Admitting: Family

## 2022-11-19 DIAGNOSIS — F418 Other specified anxiety disorders: Secondary | ICD-10-CM

## 2022-11-22 ENCOUNTER — Other Ambulatory Visit: Payer: Self-pay | Admitting: Family

## 2022-11-22 DIAGNOSIS — E559 Vitamin D deficiency, unspecified: Secondary | ICD-10-CM

## 2022-11-23 ENCOUNTER — Ambulatory Visit: Payer: No Typology Code available for payment source | Admitting: Family

## 2022-12-05 ENCOUNTER — Encounter: Payer: Self-pay | Admitting: Family

## 2022-12-05 DIAGNOSIS — G6289 Other specified polyneuropathies: Secondary | ICD-10-CM

## 2022-12-07 NOTE — Telephone Encounter (Signed)
Do you want to see in office before starting back. Her last visit with you was 10/26/22

## 2022-12-09 MED ORDER — GABAPENTIN 300 MG PO CAPS
300.0000 mg | ORAL_CAPSULE | Freq: Two times a day (BID) | ORAL | 0 refills | Status: DC
Start: 2022-12-09 — End: 2023-02-04

## 2022-12-09 NOTE — Addendum Note (Signed)
Addended by: Mort Sawyers on: 12/09/2022 01:31 PM   Modules accepted: Orders

## 2022-12-29 ENCOUNTER — Telehealth: Payer: Self-pay

## 2022-12-29 NOTE — Telephone Encounter (Signed)
Great and thank you. I agree with discussion.

## 2022-12-29 NOTE — Telephone Encounter (Signed)
I spoke with pt;pt said she scheduled appt through my chart; pt said last week her lower back pain all the way across the lower back was a dull achy continuous pain with pain level of 8 - 9,but now pt has no pain and when does have pain the pain level is 3-4. Pt has had no burning or pain upon urination and no frequency of urine. Pt has been drinking a lot of water. Last week urine was cloudy yellow with odor. Now the urine is not nearly as cloudy and the odor is a lot less. No blood seen. Pt in no distress and no known injury, no lifting,no vaginal symptoms,no hx of kidney stones.pt plans on keeping appt with T Dugal FNP on 12/30/22 at 8 AM; with UC & ED precautions and pt voiced understanding. Sending note to Hayden Pedro FNP.

## 2022-12-29 NOTE — Telephone Encounter (Signed)
-----   Message from [place] sent at 12/29/2022  1:58 PM EDT ----- Can we lease triage this patient? On my schedule tomorrow am for SEVERE back pain . Can we triage for possible kidney stone. Thanks.

## 2022-12-30 ENCOUNTER — Ambulatory Visit: Payer: No Typology Code available for payment source | Admitting: Family

## 2022-12-30 ENCOUNTER — Encounter: Payer: Self-pay | Admitting: Family

## 2022-12-30 VITALS — BP 116/72 | HR 90 | Temp 98.4°F | Ht 67.0 in | Wt 249.4 lb

## 2022-12-30 DIAGNOSIS — R81 Glycosuria: Secondary | ICD-10-CM | POA: Diagnosis not present

## 2022-12-30 DIAGNOSIS — R8290 Unspecified abnormal findings in urine: Secondary | ICD-10-CM

## 2022-12-30 DIAGNOSIS — M545 Low back pain, unspecified: Secondary | ICD-10-CM

## 2022-12-30 DIAGNOSIS — Z794 Long term (current) use of insulin: Secondary | ICD-10-CM

## 2022-12-30 DIAGNOSIS — E119 Type 2 diabetes mellitus without complications: Secondary | ICD-10-CM | POA: Diagnosis not present

## 2022-12-30 DIAGNOSIS — R82998 Other abnormal findings in urine: Secondary | ICD-10-CM

## 2022-12-30 LAB — POCT URINE DIPSTICK
Bilirubin, UA: NEGATIVE
Blood, UA: NEGATIVE
Glucose, UA: 100 mg/dL — AB
Ketones, POC UA: NEGATIVE mg/dL
Nitrite, UA: NEGATIVE
Spec Grav, UA: 1.015 (ref 1.010–1.025)
Urobilinogen, UA: 0.2 U/dL
pH, UA: 6 (ref 5.0–8.0)

## 2022-12-30 MED ORDER — NITROFURANTOIN MONOHYD MACRO 100 MG PO CAPS
100.0000 mg | ORAL_CAPSULE | Freq: Two times a day (BID) | ORAL | 0 refills | Status: AC
Start: 2022-12-30 — End: 2023-01-06

## 2022-12-30 NOTE — Assessment & Plan Note (Signed)
poct urine dip in office Urine culture ordered pending results antbx sent to pharmacy, pt to take as directed. Encouraged increased water intake throughout the day. Choosing to treat due to being symptomatic. If no improvement in the next 2 days pt advised to let me know.

## 2022-12-30 NOTE — Assessment & Plan Note (Signed)
Glucosuria in urine today. Verifying with urinalysis however if still present, have advised pt to notify endo for evaluation.  Pt to continue with tresiba and novolog as prescribed.  Advised to let endo know symptomatic with hypoglycemia at levels greater than 12 units with tresiba.

## 2022-12-30 NOTE — Assessment & Plan Note (Signed)
Suspect cause of back pain, slowly improving.  Work on low back exercises heat as needed, baclofen prn .

## 2022-12-30 NOTE — Progress Notes (Signed)
Established Patient Office Visit  Subjective:   Patient ID: Deborah Pierce, female    DOB: 1991/03/15  Age: 32 y.o. MRN: [id number]  CC:  Chief Complaint  Patient presents with   Urinary Tract Infection    HPI: Deborah Pierce is a 32 y.o. female presenting on 12/30/2022 for Urinary Tract Infection  Lower back pain, was constant but seems to have eased off a bit now, not as frequent and or as strong as it was. The back pain was so miserable that she couldn't much bend over. No dysuria. She always has urinary frequency and urgency so this is not abnormal for her. No fever or chills. She does report that she took some baclofen without much relief.   She did go to the chiropractor for relief of the back pain without success.   Diabetes: she states average 170-200 in the am, fasting. She is due for endo f/u in November. She does state she has been self increasing her tresiba, she is now taking 12 units at night. She was unable to tolerate higher, was finding she had low lows.        ROS: Negative unless specifically indicated above in HPI.   Relevant past medical history reviewed and updated as indicated.   Allergies and medications reviewed and updated.   Current Outpatient Medications:    busPIRone (BUSPAR) 5 MG tablet, TAKE 1 TABLET BY MOUTH TWICE A DAY, Disp: 180 tablet, Rfl: 1   cetirizine (ZYRTEC ALLERGY) 10 MG tablet, 1 tablet as needed Orally Once a day, Disp: , Rfl:    Cholecalciferol 1.25 MG (50000 UT) TABS, Take 1 tablet by mouth once a week., Disp: 8 tablet, Rfl: 0   Continuous Blood Gluc Sensor (DEXCOM G6 SENSOR) MISC, Apply topically as directed., Disp: , Rfl:    Continuous Blood Gluc Transmit (DEXCOM G6 TRANSMITTER) MISC, USE 1 TRANSMITTER TO MONITOR BLOOD SUGARS EVERY 3 MONTHS 90 DAYS, Disp: , Rfl:    FLUoxetine (PROZAC) 40 MG capsule, Take 1 capsule (40 mg total) by mouth daily., Disp: 90 capsule, Rfl: 3   gabapentin (NEURONTIN) 300 MG capsule, Take 1 capsule (300  mg total) by mouth 2 (two) times daily., Disp: 180 capsule, Rfl: 0   hydrOXYzine (ATARAX) 25 MG tablet, 1 tablet as needed Orally Once a day, Disp: , Rfl:    Insulin Aspart FlexPen (NOVOLOG) 100 UNIT/ML, Sliding scale insulin Less than 70 initiate hypoglycemia protocol 70-120  0 units 120-150 2 units 151-200 3 units 201-250 5units 251-300 8 units 301-350 11 units 351-400 15 units Greater than 400 call MD, Disp: , Rfl:    Insulin Disposable Pump (OMNIPOD 5 G6 PODS, GEN 5,) MISC, SMARTSIG:SUB-Q Every Other Day, Disp: , Rfl:    metFORMIN (GLUCOPHAGE) 1000 MG tablet, TAKE 1 TABLET (1,000 MG TOTAL) BY MOUTH TWICE A DAY WITH FOOD, Disp: 180 tablet, Rfl: 1   NIFEdipine (PROCARDIA-XL/NIFEDICAL-XL) 30 MG 24 hr tablet, Take 30 mg by mouth daily., Disp: , Rfl:    nitrofurantoin, macrocrystal-monohydrate, (MACROBID) 100 MG capsule, Take 1 capsule (100 mg total) by mouth 2 (two) times daily for 7 days., Disp: 14 capsule, Rfl: 0   norethindrone (MICRONOR) 0.35 MG tablet, Take 1 tablet (0.35 mg total) by mouth daily., Disp: 28 tablet, Rfl: 11   NOVOLOG 100 UNIT/ML injection, use up to 100 units daily subcutaneously daily in the pump for 30 days Ok to sub Humalog if insurance prefers, Disp: , Rfl:    pravastatin (PRAVACHOL) 10 MG tablet, Take 1  tablet (10 mg total) by mouth daily., Disp: 90 tablet, Rfl: 3   TRESIBA FLEXTOUCH 200 UNIT/ML FlexTouch Pen, START WITH 40 UNITS DAILY AND GRADUALLY TITRATE UP AS DIRECTED TO 70 UNITS DAILY., Disp: , Rfl:   Allergies  Allergen Reactions   Fruit Extracts Itching and Other (See Comments)    Apples, oranges, kiwi, peaches, plums and carrots Itching of tongue    Insulins Nausea Only and Rash    Insulin glargine, degludec, aspart     Sertraline Other (See Comments)    Increased depression /suicide    Tree Extract Itching   Bee Venom Rash    unknown   Insulin Degludec-Liraglutide Rash   Insulin Glargine-Lixisenatide Rash   Penicillins Rash   Sulfa Antibiotics Rash     Objective:   BP 116/72 (BP Location: Right Arm, Patient Position: Sitting, Cuff Size: Normal)   Pulse 90   Temp 98.4 F (36.9 C) (Temporal)   Ht 5\' 7"  (1.702 m)   Wt 249 lb 6.4 oz (113.1 kg)   SpO2 96%   BMI 39.06 kg/m    Physical Exam Vitals reviewed.  Constitutional:      General: She is not in acute distress.    Appearance: Normal appearance. She is normal weight. She is not ill-appearing, toxic-appearing or diaphoretic.  Cardiovascular:     Rate and Rhythm: Normal rate.  Pulmonary:     Effort: Pulmonary effort is normal.  Abdominal:     General: Abdomen is flat.     Tenderness: There is no abdominal tenderness. There is no right CVA tenderness or left CVA tenderness.  Musculoskeletal:     Lumbar back: Tenderness (midline palpable tenderness) present. Normal range of motion.  Neurological:     General: No focal deficit present.     Mental Status: She is alert and oriented to person, place, and time. Mental status is at baseline.     Motor: No weakness.  Psychiatric:        Mood and Affect: Mood normal.        Behavior: Behavior normal.        Thought Content: Thought content normal.        Judgment: Judgment normal.     Assessment & Plan:  Abnormal urine odor -     POCT URINE DIPSTICK -     Urinalysis w microscopic + reflex cultur -     Nitrofurantoin Monohyd Macro; Take 1 capsule (100 mg total) by mouth 2 (two) times daily for 7 days.  Dispense: 14 capsule; Refill: 0  Glucosuria -     Urinalysis w microscopic + reflex cultur -     Nitrofurantoin Monohyd Macro; Take 1 capsule (100 mg total) by mouth 2 (two) times daily for 7 days.  Dispense: 14 capsule; Refill: 0  Acute midline low back pain without sciatica Assessment & Plan: Suspect cause of back pain, slowly improving.  Work on low back exercises heat as needed, baclofen prn .    Orders: -     Urinalysis w microscopic + reflex cultur  Controlled type 2 diabetes mellitus without complication, with  long-term current use of insulin (HCC) Assessment & Plan: Glucosuria in urine today. Verifying with urinalysis however if still present, have advised pt to notify endo for evaluation.  Pt to continue with tresiba and novolog as prescribed.  Advised to let endo know symptomatic with hypoglycemia at levels greater than 12 units with tresiba.    Urine leukocytes Assessment & Plan: poct urine dip in  office Urine culture ordered pending results antbx sent to pharmacy, pt to take as directed. Encouraged increased water intake throughout the day. Choosing to treat due to being symptomatic. If no improvement in the next 2 days pt advised to let me know.       Follow up plan: Return in about 2 months (around 03/01/2023) for f/u cholesterol.  Mort Sawyers, FNP

## 2023-01-01 LAB — URINALYSIS W MICROSCOPIC + REFLEX CULTURE
Bilirubin Urine: NEGATIVE
Glucose, UA: NEGATIVE
Hgb urine dipstick: NEGATIVE
Ketones, ur: NEGATIVE
Nitrites, Initial: NEGATIVE
Protein, ur: NEGATIVE
RBC / HPF: NONE SEEN /HPF (ref 0–2)
Specific Gravity, Urine: 1.018 (ref 1.001–1.035)
pH: 5.5 (ref 5.0–8.0)

## 2023-01-01 LAB — URINE CULTURE
MICRO NUMBER:: 15486504
Result:: NO GROWTH
SPECIMEN QUALITY:: ADEQUATE

## 2023-01-01 LAB — CULTURE INDICATED

## 2023-01-13 ENCOUNTER — Encounter: Payer: Self-pay | Admitting: Family

## 2023-01-14 ENCOUNTER — Other Ambulatory Visit: Payer: Self-pay | Admitting: Family

## 2023-02-04 ENCOUNTER — Other Ambulatory Visit: Payer: Self-pay | Admitting: Family

## 2023-02-04 ENCOUNTER — Encounter: Payer: Self-pay | Admitting: Family

## 2023-02-04 ENCOUNTER — Ambulatory Visit: Payer: No Typology Code available for payment source | Admitting: Family

## 2023-02-04 ENCOUNTER — Ambulatory Visit (INDEPENDENT_AMBULATORY_CARE_PROVIDER_SITE_OTHER)
Admission: RE | Admit: 2023-02-04 | Discharge: 2023-02-04 | Disposition: A | Discharge status: Home / Self Care | Payer: No Typology Code available for payment source | Source: Ambulatory Visit | Attending: Family | Admitting: Family

## 2023-02-04 VITALS — BP 136/86 | HR 94 | Temp 97.7°F | Ht 67.0 in | Wt 256.4 lb

## 2023-02-04 DIAGNOSIS — M5441 Lumbago with sciatica, right side: Secondary | ICD-10-CM

## 2023-02-04 DIAGNOSIS — M5440 Lumbago with sciatica, unspecified side: Secondary | ICD-10-CM | POA: Diagnosis not present

## 2023-02-04 DIAGNOSIS — G6289 Other specified polyneuropathies: Secondary | ICD-10-CM

## 2023-02-04 DIAGNOSIS — E1140 Type 2 diabetes mellitus with diabetic neuropathy, unspecified: Secondary | ICD-10-CM | POA: Insufficient documentation

## 2023-02-04 DIAGNOSIS — Z794 Long term (current) use of insulin: Secondary | ICD-10-CM | POA: Diagnosis not present

## 2023-02-04 DIAGNOSIS — G629 Polyneuropathy, unspecified: Secondary | ICD-10-CM | POA: Insufficient documentation

## 2023-02-04 DIAGNOSIS — M5127 Other intervertebral disc displacement, lumbosacral region: Secondary | ICD-10-CM | POA: Diagnosis not present

## 2023-02-04 DIAGNOSIS — M4310 Spondylolisthesis, site unspecified: Secondary | ICD-10-CM | POA: Insufficient documentation

## 2023-02-04 MED ORDER — KETOROLAC TROMETHAMINE 30 MG/ML IJ SOLN
30.0000 mg | Freq: Once | INTRAMUSCULAR | 0 refills | Status: AC
Start: 2023-02-04 — End: 2023-02-04

## 2023-02-04 MED ORDER — KETOROLAC TROMETHAMINE 30 MG/ML IJ SOLN
30.0000 mg | Freq: Once | INTRAMUSCULAR | Status: AC
Start: 2023-02-04 — End: 2023-02-04
  Administered 2023-02-04: 30 mg via INTRAMUSCULAR

## 2023-02-04 MED ORDER — TIZANIDINE HCL 4 MG PO TABS
ORAL_TABLET | ORAL | 0 refills | Status: DC
Start: 2023-02-04 — End: 2023-03-01

## 2023-02-04 MED ORDER — GABAPENTIN 300 MG PO CAPS
ORAL_CAPSULE | ORAL | 0 refills | Status: AC
Start: 2023-02-04 — End: ?

## 2023-02-04 NOTE — Assessment & Plan Note (Signed)
Toradol 30 mg IM in office, advised do not take ibuprofen today not until tomorrow Can use tylenol extra strength prn  Advised on pain measures after today,  Recommend tylenol 500 mg and ibuprofen 600 mg together for pain every 6-8 hours.  Heat to site, lidocaine patches as needed.  Referral placed to neurospine as condition not improving.  Stat lumbar spine xray today

## 2023-02-04 NOTE — Progress Notes (Signed)
Established Patient Office Visit  Subjective:   Patient ID: Deborah Pierce, female    DOB: 1990/07/08  Age: 32 y.o. MRN: [id number]  CC:  Chief Complaint  Patient presents with   Back Pain    HPI: Deborah Pierce is a 32 y.o. female presenting on 02/04/2023 for Back Pain  9/18 came in with lower back pain constant, that had slightly eased off. Urine was ordered, negative for infection. Had taken baclofen without much relief. Chiropractor was not helpful. States since then eased off 'just a bit' but is back again.   Symptom onset started to become worse again since Saturday (for the last six days ago). Has been on the couch, mainly lying down but unable to move much without pain. The pain is pinpoint mid to right lower back. Some slight radiation of pain down the right leg at times. Has taken aleve without any relief.   Today without urinary symptoms. No known injury or back trauma. Has had back pain before, but not this bad. She does have a small infant so has to lift him and carry him around often. Even hard to do dishes or stand a long period of time without pain.   DM2: seeing endo in the next few weeks. Currently 89 this am. Has been around 180-200's .  Lab Results  Component Value Date   HGBA1C 12.5 Repeated and verified X2. (H) 10/26/2022   She also states her neuropathy is worsening, especially at night hard to even sleep. She is currently on gabapentin 300 mg twice daily.       ROS: Negative unless specifically indicated above in HPI.   Relevant past medical history reviewed and updated as indicated.   Allergies and medications reviewed and updated.   Current Outpatient Medications:    ketorolac (TORADOL) 30 MG/ML injection, Inject 1 mL (30 mg total) into the vein once for 1 dose., Disp: 1 mL, Rfl: 0   tiZANidine (ZANAFLEX) 4 MG tablet, Take 1/2 to one tablet po at bedtime prn muscle spasm, Disp: 30 tablet, Rfl: 0   busPIRone (BUSPAR) 5 MG tablet, TAKE 1 TABLET BY  MOUTH TWICE A DAY, Disp: 180 tablet, Rfl: 1   cetirizine (ZYRTEC ALLERGY) 10 MG tablet, 1 tablet as needed Orally Once a day, Disp: , Rfl:    Continuous Blood Gluc Sensor (DEXCOM G6 SENSOR) MISC, Apply topically as directed., Disp: , Rfl:    Continuous Blood Gluc Transmit (DEXCOM G6 TRANSMITTER) MISC, USE 1 TRANSMITTER TO MONITOR BLOOD SUGARS EVERY 3 MONTHS 90 DAYS, Disp: , Rfl:    FLUoxetine (PROZAC) 40 MG capsule, Take 1 capsule (40 mg total) by mouth daily., Disp: 90 capsule, Rfl: 3   gabapentin (NEURONTIN) 300 MG capsule, Take one tablet in am (300 mg) and two tablets pm (600 mg), Disp: 90 capsule, Rfl: 0   hydrOXYzine (ATARAX) 25 MG tablet, 1 tablet as needed Orally Once a day, Disp: , Rfl:    Insulin Aspart FlexPen (NOVOLOG) 100 UNIT/ML, Sliding scale insulin Less than 70 initiate hypoglycemia protocol 70-120  0 units 120-150 2 units 151-200 3 units 201-250 5units 251-300 8 units 301-350 11 units 351-400 15 units Greater than 400 call MD, Disp: , Rfl:    Insulin Disposable Pump (OMNIPOD 5 G6 PODS, GEN 5,) MISC, SMARTSIG:SUB-Q Every Other Day, Disp: , Rfl:    metFORMIN (GLUCOPHAGE) 1000 MG tablet, TAKE 1 TABLET (1,000 MG TOTAL) BY MOUTH TWICE A DAY WITH FOOD, Disp: 180 tablet, Rfl: 1   NIFEdipine (PROCARDIA-XL/NIFEDICAL-XL)  30 MG 24 hr tablet, Take 30 mg by mouth daily., Disp: , Rfl:    norethindrone (MICRONOR) 0.35 MG tablet, Take 1 tablet (0.35 mg total) by mouth daily., Disp: 28 tablet, Rfl: 11   NOVOLOG 100 UNIT/ML injection, use up to 100 units daily subcutaneously daily in the pump for 30 days Ok to sub Humalog if insurance prefers, Disp: , Rfl:    pravastatin (PRAVACHOL) 10 MG tablet, Take 1 tablet (10 mg total) by mouth daily., Disp: 90 tablet, Rfl: 3   TRESIBA FLEXTOUCH 200 UNIT/ML FlexTouch Pen, START WITH 40 UNITS DAILY AND GRADUALLY TITRATE UP AS DIRECTED TO 70 UNITS DAILY., Disp: , Rfl:   Allergies  Allergen Reactions   Fruit Extracts Itching and Other (See Comments)    Apples,  oranges, kiwi, peaches, plums and carrots Itching of tongue    Insulins Nausea Only and Rash    Insulin glargine, degludec, aspart     Sertraline Other (See Comments)    Increased depression /suicide    Tree Extract Itching   Bee Venom Rash    unknown   Insulin Degludec-Liraglutide Rash   Insulin Glargine-Lixisenatide Rash   Penicillins Rash   Sulfa Antibiotics Rash    Objective:   BP 136/86 (BP Location: Left Arm, Patient Position: Sitting, Cuff Size: Large)   Pulse 94   Temp 97.7 F (36.5 C) (Temporal)   Ht 5\' 7"  (1.702 m)   Wt 256 lb 6.4 oz (116.3 kg)   LMP 02/01/2023 (Exact Date) Comment: on BC  SpO2 95%   BMI 40.16 kg/m    Physical Exam Constitutional:      General: She is not in acute distress.    Appearance: Normal appearance. She is normal weight. She is not ill-appearing, toxic-appearing or diaphoretic.  HENT:     Head: Normocephalic.  Cardiovascular:     Rate and Rhythm: Normal rate.  Pulmonary:     Effort: Pulmonary effort is normal.  Musculoskeletal:     Lumbar back: Spasms and tenderness (point tenderness mid right lower back) present. Decreased range of motion (very limited ROM with flexion and external rotation). Negative right straight leg raise test and negative left straight leg raise test.       Back:  Neurological:     General: No focal deficit present.     Mental Status: She is alert and oriented to person, place, and time. Mental status is at baseline.  Psychiatric:        Mood and Affect: Mood normal.        Behavior: Behavior normal.        Thought Content: Thought content normal.        Judgment: Judgment normal.     Assessment & Plan:  Acute right-sided low back pain with right-sided sciatica Assessment & Plan: Toradol 30 mg IM in office, advised do not take ibuprofen today not until tomorrow Can use tylenol extra strength prn  Advised on pain measures after today,  Recommend tylenol 500 mg and ibuprofen 600 mg together for pain  every 6-8 hours.  Heat to site, lidocaine patches as needed.  Referral placed to neurospine as condition not improving.  Stat lumbar spine xray today     Orders: -     tiZANidine HCl; Take 1/2 to one tablet po at bedtime prn muscle spasm  Dispense: 30 tablet; Refill: 0 -     DG Lumbar Spine Complete; Future -     Ketorolac Tromethamine; Inject 1 mL (30 mg total)  into the vein once for 1 dose.  Dispense: 1 mL; Refill: 0 -     Ambulatory referral to Neurosurgery -     Ketorolac Tromethamine  Other polyneuropathy -     Ambulatory referral to Neurosurgery -     Gabapentin; Take one tablet in am (300 mg) and two tablets pm (600 mg)  Dispense: 90 capsule; Refill: 0  Type 2 diabetes mellitus with diabetic neuropathy, with long-term current use of insulin (HCC) Assessment & Plan: Increase gabapentin to 300 mg qam and 600 mg qpm  F/u with endo as scheduled, continue to check daily sugars.  Need strict tight control on diet as able as well, limited on exercise due to acute back pain.      Follow up plan: Return if symptoms worsen or fail to improve.  Mort Sawyers, FNP

## 2023-02-04 NOTE — Patient Instructions (Signed)
Recommend tylenol 500 mg and ibuprofen 600 mg together for pain every 6-8 hours.  Heat to site, lidocaine patches as needed.  Exercises to area as tolerated.    Regards,   Mort Sawyers FNP-C

## 2023-02-04 NOTE — Assessment & Plan Note (Signed)
Increase gabapentin to 300 mg qam and 600 mg qpm  F/u with endo as scheduled, continue to check daily sugars.  Need strict tight control on diet as able as well, limited on exercise due to acute back pain.

## 2023-02-10 DIAGNOSIS — E1165 Type 2 diabetes mellitus with hyperglycemia: Secondary | ICD-10-CM | POA: Diagnosis not present

## 2023-02-10 DIAGNOSIS — E782 Mixed hyperlipidemia: Secondary | ICD-10-CM | POA: Diagnosis not present

## 2023-02-10 DIAGNOSIS — R635 Abnormal weight gain: Secondary | ICD-10-CM | POA: Diagnosis not present

## 2023-02-17 DIAGNOSIS — H5213 Myopia, bilateral: Secondary | ICD-10-CM | POA: Diagnosis not present

## 2023-02-17 LAB — HM DIABETES EYE EXAM

## 2023-02-19 NOTE — Progress Notes (Unsigned)
Referring Physician:  Mort Sawyers, FNP [address] [place] [place],  [place] [address]  Primary Physician:  Mort Sawyers, FNP  History of Present Illness: 02/19/2023*** Deborah Pierce has a history of DM with neuropathy, hyperlipidemia, and obesity.   2 month history of mid to right lower back pain with intermittent right *** leg pain. Pain is worse with lifting (has infant) and standing (doing dishes).     She has known diabetic neuropathy.   Was given toradol injection by PCP and advised to use tylenol, motrin, and lidocaine patches. She was also given zanaflex and was to continue on her neurontin.   Duration: *** Location: *** Quality: *** Severity: ***  Precipitating: aggravated by *** Modifying factors: made better by *** Weakness: none Timing: *** Bowel/Bladder Dysfunction: none  Conservative measures:  Physical therapy: ***  Multimodal medical therapy including regular antiinflammatories: tylenol, motrin, neurotin, zanaflex, lidocaine patches  Injections: *** epidural steroid injections  Past Surgery: ***  Deborah Pierce has ***no symptoms of cervical myelopathy.  The symptoms are causing a significant impact on the patient's life.   Review of Systems:  A 10 point review of systems is negative, except for the pertinent positives and negatives detailed in the HPI.  Past Medical History: Past Medical History:  Diagnosis Date   Anxiety    Depression    Diabetes mellitus without complication (HCC)    Dysplastic nevus 02/20/2022   Severe, R to mid upper back paraspinal, Excised 07/21/22   Dysplastic nevus 02/20/2022   Severe, L mid back paraspinal, Excised 08/18/22   Dysplastic nevus 02/20/2022   mild, L lateral buttocks   Fractured elbow 2022   rt   Headache    Other fatigue 08/12/2021   Pregnancy induced hypertension 05/11/2022   Pt states they just noticed about a week ago.   Seasonal allergies 08/07/2011   UTI (urinary tract infection)      Past Surgical History: Past Surgical History:  Procedure Laterality Date   CESAREAN SECTION N/A 05/29/2022   Procedure: CESAREAN SECTION;  Surgeon: Kathrynn Running, MD;  Location: [facility] LD ORS;  Service: Obstetrics;  Laterality: N/A;   LAPAROSCOPY     looking for endometriosis    Allergies: Allergies as of 02/23/2023 - Review Complete 02/04/2023  Allergen Reaction Noted   Fruit extracts Itching and Other (See Comments) 05/28/2022   Insulins Nausea Only and Rash 04/20/2020   Sertraline Other (See Comments) 10/26/2022   Tree extract Itching 11/19/2021   Bee venom Rash 04/20/2020   Insulin degludec-liraglutide Rash 11/19/2021   Insulin glargine-lixisenatide Rash 11/19/2021   Penicillins Rash 04/20/2020   Sulfa antibiotics Rash 04/20/2020    Medications: Outpatient Encounter Medications as of 02/23/2023  Medication Sig   busPIRone (BUSPAR) 5 MG tablet TAKE 1 TABLET BY MOUTH TWICE A DAY   cetirizine (ZYRTEC ALLERGY) 10 MG tablet 1 tablet as needed Orally Once a day   Continuous Blood Gluc Sensor (DEXCOM G6 SENSOR) MISC Apply topically as directed.   Continuous Blood Gluc Transmit (DEXCOM G6 TRANSMITTER) MISC USE 1 TRANSMITTER TO MONITOR BLOOD SUGARS EVERY 3 MONTHS 90 DAYS   FLUoxetine (PROZAC) 40 MG capsule Take 1 capsule (40 mg total) by mouth daily.   gabapentin (NEURONTIN) 300 MG capsule Take one tablet in am (300 mg) and two tablets pm (600 mg)   hydrOXYzine (ATARAX) 25 MG tablet 1 tablet as needed Orally Once a day   Insulin Aspart FlexPen (NOVOLOG) 100 UNIT/ML Sliding scale insulin Less than 70  initiate hypoglycemia protocol 70-120  0 units 120-150 2 units 151-200 3 units 201-250 5units 251-300 8 units 301-350 11 units 351-400 15 units Greater than 400 call MD   Insulin Disposable Pump (OMNIPOD 5 G6 PODS, GEN 5,) MISC SMARTSIG:SUB-Q Every Other Day   metFORMIN (GLUCOPHAGE) 1000 MG tablet TAKE 1 TABLET (1,000 MG TOTAL) BY MOUTH TWICE A DAY WITH FOOD   NIFEdipine  (PROCARDIA-XL/NIFEDICAL-XL) 30 MG 24 hr tablet Take 30 mg by mouth daily.   norethindrone (MICRONOR) 0.35 MG tablet Take 1 tablet (0.35 mg total) by mouth daily.   NOVOLOG 100 UNIT/ML injection use up to 100 units daily subcutaneously daily in the pump for 30 days Ok to sub Humalog if insurance prefers   pravastatin (PRAVACHOL) 10 MG tablet Take 1 tablet (10 mg total) by mouth daily.   tiZANidine (ZANAFLEX) 4 MG tablet Take 1/2 to one tablet po at bedtime prn muscle spasm   TRESIBA FLEXTOUCH 200 UNIT/ML FlexTouch Pen START WITH 40 UNITS DAILY AND GRADUALLY TITRATE UP AS DIRECTED TO 70 UNITS DAILY.   No facility-administered encounter medications on file as of 02/23/2023.    Social History: Social History   Tobacco Use   Smoking status: Never   Smokeless tobacco: Never  Vaping Use   Vaping status: Never Used  Substance Use Topics   Alcohol use: Not Currently   Drug use: Not Currently    Types: Marijuana    Family Medical History: Family History  Problem Relation Age of Onset   Hypertension Mother    Heart disease Mother    Skin cancer Mother        melanoma   Diabetes Mother    Diabetes type II Mother    Melanoma Mother    Diabetes Sister    Heart disease Maternal Grandmother    Hypertension Maternal Grandmother    Diabetes Maternal Grandmother    Lung cancer Maternal Grandfather    Brain cancer Maternal Grandfather    Asthma Neg Hx     Physical Examination: There were no vitals filed for this visit.  General: Patient is well developed, well nourished, calm, collected, and in no apparent distress. Attention to examination is appropriate.  Respiratory: Patient is breathing without any difficulty.   NEUROLOGICAL:     Awake, alert, oriented to person, place, and time.  Speech is clear and fluent. Fund of knowledge is appropriate.   Cranial Nerves: Pupils equal round and reactive to light.  Facial tone is symmetric.    *** ROM of cervical spine *** pain ***  posterior cervical tenderness. *** tenderness in bilateral trapezial region.   *** ROM of lumbar spine *** pain *** posterior lumbar tenderness.   No abnormal lesions on exposed skin.   Strength: Side Biceps Triceps Deltoid Interossei Grip Wrist Ext. Wrist Flex.  R 5 5 5 5 5 5 5   L 5 5 5 5 5 5 5    Side Iliopsoas Quads Hamstring PF DF EHL  R 5 5 5 5 5 5   L 5 5 5 5 5 5    Reflexes are ***2+ and symmetric at the biceps, brachioradialis, patella and achilles.   Hoffman's is absent.  Clonus is not present.   Bilateral upper and lower extremity sensation is intact to light touch.     Gait is normal.   ***No difficulty with tandem gait.    Medical Decision Making  Imaging: Lumbar xrays dated 02/04/23:  FINDINGS: There is no evidence of lumbar spine fracture. Straightening of the normal  lumbar lordosis. Trace retrolisthesis of L5 on S1. Intervertebral disc spaces are maintained. Nonobstructive bowel gas pattern.   IMPRESSION: Trace retrolisthesis of L5 on S1.     Electronically Signed   By: Wiliam Ke M.D.   On: 02/04/2023 11:20  I have personally reviewed the images and agree with the above interpretation.  Assessment and Plan: Deborah Pierce is a pleasant 32 y.o. female has ***  Treatment options discussed with patient and following plan made:   - Order for physical therapy for *** spine ***. Patient to call to schedule appointment. *** - Continue current medications including ***. Reviewed dosing and side effects.  - Prescription for ***. Reviewed dosing and side effects. Take with food.  - Prescription for *** to take prn muscle spasms. Reviewed dosing and side effects. Discussed this can cause drowsiness.  - MRI of *** to further evaluate *** radiculopathy. No improvement time or medications (***).  - Referral to PMR at [facility] to discuss possible *** injections.  - Will schedule phone visit to review MRI results once I get them back.   I spent a total of *** minutes in  face-to-face and non-face-to-face activities related to this patient's care today including review of outside records, review of imaging, review of symptoms, physical exam, discussion of differential diagnosis, discussion of treatment options, and documentation.   Thank you for involving me in the care of this patient.   Drake Leach PA-C Dept. of Neurosurgery

## 2023-02-23 ENCOUNTER — Encounter: Payer: Self-pay | Admitting: Orthopedic Surgery

## 2023-02-23 ENCOUNTER — Ambulatory Visit: Payer: No Typology Code available for payment source | Admitting: Orthopedic Surgery

## 2023-02-23 VITALS — BP 122/78 | Ht 67.0 in | Wt 262.0 lb

## 2023-02-23 DIAGNOSIS — G8929 Other chronic pain: Secondary | ICD-10-CM

## 2023-02-23 DIAGNOSIS — R202 Paresthesia of skin: Secondary | ICD-10-CM | POA: Diagnosis not present

## 2023-02-23 DIAGNOSIS — M5416 Radiculopathy, lumbar region: Secondary | ICD-10-CM | POA: Diagnosis not present

## 2023-02-23 DIAGNOSIS — M545 Low back pain, unspecified: Secondary | ICD-10-CM | POA: Diagnosis not present

## 2023-02-23 NOTE — Patient Instructions (Signed)
It was so nice to see you today. Thank you so much for coming in.    You have some minimal wear and tear in your back.   I want to get an MRI of your lower back to look into things further. We will get this approved through your insurance and [facility] will call you to schedule the appointment.   [facility] (building with the white pillars) is located off of [address]. The address is [address], [place], [place] [address].    After you have the MRI, it takes 10-14 days for me to get the results back. Once I have them, we will call you to schedule a follow up visit with me to review them.   I want you to see neurology at the [facility] clinic for further evaluation of your neuropathy. They should call you to schedule an appointment or you can call them at [phone].   Please do not hesitate to call if you have any questions or concerns. You can also message me in MyChart.   Drake Leach PA-C [phone]     The physicians and staff at [facility] are committed to providing excellent care. You may receive a survey asking for feedback about your experience at our office. We value you your feedback and appreciate you taking the time to to fill it out. The [facility] leadership team is also available to discuss your experience in person, feel free to contact us [phone].

## 2023-02-26 ENCOUNTER — Other Ambulatory Visit: Payer: Self-pay | Admitting: Family

## 2023-02-26 DIAGNOSIS — M5441 Lumbago with sciatica, right side: Secondary | ICD-10-CM

## 2023-03-03 ENCOUNTER — Ambulatory Visit: Payer: No Typology Code available for payment source | Admitting: Dermatology

## 2023-03-03 ENCOUNTER — Encounter: Payer: Self-pay | Admitting: Dermatology

## 2023-03-03 DIAGNOSIS — Z808 Family history of malignant neoplasm of other organs or systems: Secondary | ICD-10-CM | POA: Diagnosis not present

## 2023-03-03 DIAGNOSIS — W908XXA Exposure to other nonionizing radiation, initial encounter: Secondary | ICD-10-CM | POA: Diagnosis not present

## 2023-03-03 DIAGNOSIS — D229 Melanocytic nevi, unspecified: Secondary | ICD-10-CM

## 2023-03-03 DIAGNOSIS — Z86018 Personal history of other benign neoplasm: Secondary | ICD-10-CM | POA: Diagnosis not present

## 2023-03-03 DIAGNOSIS — D492 Neoplasm of unspecified behavior of bone, soft tissue, and skin: Secondary | ICD-10-CM | POA: Diagnosis not present

## 2023-03-03 DIAGNOSIS — D2271 Melanocytic nevi of right lower limb, including hip: Secondary | ICD-10-CM | POA: Diagnosis not present

## 2023-03-03 DIAGNOSIS — D2261 Melanocytic nevi of right upper limb, including shoulder: Secondary | ICD-10-CM | POA: Diagnosis not present

## 2023-03-03 DIAGNOSIS — L821 Other seborrheic keratosis: Secondary | ICD-10-CM

## 2023-03-03 DIAGNOSIS — L578 Other skin changes due to chronic exposure to nonionizing radiation: Secondary | ICD-10-CM

## 2023-03-03 DIAGNOSIS — Z1283 Encounter for screening for malignant neoplasm of skin: Secondary | ICD-10-CM

## 2023-03-03 DIAGNOSIS — D225 Melanocytic nevi of trunk: Secondary | ICD-10-CM

## 2023-03-03 DIAGNOSIS — L814 Other melanin hyperpigmentation: Secondary | ICD-10-CM

## 2023-03-03 NOTE — Progress Notes (Signed)
Follow-Up Visit   Subjective  Deborah Pierce is a 32 y.o. female who presents for the following: Skin Cancer Screening and Full Body Skin Exam, hx of Dysplastic Nevi, check spot R shoulder, has had for yrs, itchy and raised  The patient presents for Total-Body Skin Exam (TBSE) for skin cancer screening and mole check. The patient has spots, moles and lesions to be evaluated, some may be new or changing and the patient may have concern these could be cancer.    The following portions of the chart were reviewed this encounter and updated as appropriate: medications, allergies, medical history  Review of Systems:  No other skin or systemic complaints except as noted in HPI or Assessment and Plan.  Objective  Well appearing patient in no apparent distress; mood and affect are within normal limits.  A full examination was performed including scalp, head, eyes, ears, nose, lips, neck, chest, axillae, abdomen, back, buttocks, bilateral upper extremities, bilateral lower extremities, hands, feet, fingers, toes, fingernails, and toenails. All findings within normal limits unless otherwise noted below.   Relevant physical exam findings are noted in the Assessment and Plan.  R top of shoulder 0.4cm tan pap     Back            Assessment & Plan   SKIN CANCER SCREENING PERFORMED TODAY.  ACTINIC DAMAGE - Chronic condition, secondary to cumulative UV/sun exposure - diffuse scaly erythematous macules with underlying dyspigmentation - Recommend daily broad spectrum sunscreen SPF 30+ to sun-exposed areas, reapply every 2 hours as needed.  - Staying in the shade or wearing long sleeves, sun glasses (UVA+UVB protection) and wide brim hats (4-inch brim around the entire circumference of the hat) are also recommended for sun protection.  - Call for new or changing lesions.  LENTIGINES, SEBORRHEIC KERATOSES, HEMANGIOMAS - Benign normal skin lesions - Benign-appearing - Call for any  changes  MELANOCYTIC NEVI - Tan-brown and/or pink-flesh-colored symmetric macules and papules - Benign appearing on exam today - Observation - Call clinic for new or changing moles - Recommend daily use of broad spectrum spf 30+ sunscreen to sun-exposed areas.  - R 2nd toe dorsum 0.2cm reg brown macule stable - Upper back Slightly irregular brown macules back see photos, observe  HISTORY OF DYSPLASTIC NEVUS No evidence of recurrence today Recommend regular full body skin exams Recommend daily broad spectrum sunscreen SPF 30+ to sun-exposed areas, reapply every 2 hours as needed.  Call if any new or changing lesions are noted between office visits  - R to mid upper back paraspinal, L mid back paraspinal, L lat buttocks  FAMILY HISTORY OF SKIN CANCER What type(s):melanoma x 2 Who affected:mother   Neoplasm of skin R top of shoulder  Epidermal / dermal shaving  Lesion diameter (cm):  0.4 Informed consent: discussed and consent obtained   Timeout: patient name, date of birth, surgical site, and procedure verified   Procedure prep:  Patient was prepped and draped in usual sterile fashion Prep type:  Isopropyl alcohol Anesthesia: the lesion was anesthetized in a standard fashion   Anesthetic:  1% lidocaine w/ epinephrine 1-100,000 buffered w/ 8.4% NaHCO3 Instrument used: flexible razor blade   Hemostasis achieved with: pressure, aluminum chloride and electrodesiccation   Outcome: patient tolerated procedure well   Post-procedure details: sterile dressing applied and wound care instructions given   Dressing type: bandage and petrolatum    Specimen 1 - Surgical pathology Differential Diagnosis: D48.5 Irritated Nevus r/o Atypia  Check Margins: yes 0.4cm  tan pap   Return in about 1 year (around 03/02/2024) for  TBSE , Hx of Dysplastic nevi.  I, [name], RMA, am acting as scribe for Armida Sans, MD .   Documentation: I have reviewed the above documentation for  accuracy and completeness, and I agree with the above.  Armida Sans, MD

## 2023-03-03 NOTE — Patient Instructions (Addendum)

## 2023-03-05 ENCOUNTER — Other Ambulatory Visit (INDEPENDENT_AMBULATORY_CARE_PROVIDER_SITE_OTHER): Payer: No Typology Code available for payment source

## 2023-03-05 DIAGNOSIS — Z79899 Other long term (current) drug therapy: Secondary | ICD-10-CM | POA: Diagnosis not present

## 2023-03-05 DIAGNOSIS — E1140 Type 2 diabetes mellitus with diabetic neuropathy, unspecified: Secondary | ICD-10-CM

## 2023-03-05 DIAGNOSIS — Z13220 Encounter for screening for lipoid disorders: Secondary | ICD-10-CM | POA: Diagnosis not present

## 2023-03-05 DIAGNOSIS — Z794 Long term (current) use of insulin: Secondary | ICD-10-CM | POA: Diagnosis not present

## 2023-03-05 LAB — HEPATIC FUNCTION PANEL
ALT: 29 U/L (ref 0–35)
AST: 19 U/L (ref 0–37)
Albumin: 4.6 g/dL (ref 3.5–5.2)
Alkaline Phosphatase: 83 U/L (ref 39–117)
Bilirubin, Direct: 0.1 mg/dL (ref 0.0–0.3)
Total Bilirubin: 0.4 mg/dL (ref 0.2–1.2)
Total Protein: 7.3 g/dL (ref 6.0–8.3)

## 2023-03-05 LAB — LDL CHOLESTEROL, DIRECT: Direct LDL: 49 mg/dL

## 2023-03-05 LAB — LIPID PANEL
Cholesterol: 117 mg/dL (ref 0–200)
HDL: 32.8 mg/dL — ABNORMAL LOW (ref >39.00)
Total CHOL/HDL Ratio: 4
Triglycerides: 425 mg/dL — ABNORMAL HIGH (ref 0.0–149.0)

## 2023-03-05 LAB — HEMOGLOBIN A1C: Hgb A1c MFr Bld: 8.7 % — ABNORMAL HIGH (ref 4.6–6.5)

## 2023-03-05 NOTE — Addendum Note (Signed)
Addended by: Alvina Chou on: 03/05/2023 09:33 AM   Modules accepted: Orders

## 2023-03-08 ENCOUNTER — Other Ambulatory Visit: Payer: Self-pay | Admitting: Family

## 2023-03-08 DIAGNOSIS — E781 Pure hyperglyceridemia: Secondary | ICD-10-CM

## 2023-03-08 LAB — SURGICAL PATHOLOGY

## 2023-03-08 MED ORDER — OMEGA-3-ACID ETHYL ESTERS 1 G PO CAPS: 2.0000 g | ORAL_CAPSULE | Freq: Two times a day (BID) | ORAL | 0 refills | Status: DC

## 2023-03-08 NOTE — Progress Notes (Signed)
I was able to draw her A1C at her last lab visit for your review

## 2023-03-09 ENCOUNTER — Telehealth: Payer: Self-pay

## 2023-03-09 NOTE — Telephone Encounter (Addendum)
Called and discussed results with patient. She verbalized understanding and denied further questions.   ----- Message from Deborah Pierce sent at 03/08/2023  7:55 PM EST ----- FINAL DIAGNOSIS        1. Skin, R top of shoulder :       MELANOCYTIC NEVUS WITH HYPERPIGMENTATION, IRRITATED, DEEP MARGIN INVOLVED   Benign mole No further treatment needed

## 2023-03-14 ENCOUNTER — Ambulatory Visit
Admission: RE | Admit: 2023-03-14 | Discharge: 2023-03-14 | Disposition: A | Discharge status: Home / Self Care | Payer: No Typology Code available for payment source | Source: Ambulatory Visit | Attending: Orthopedic Surgery | Admitting: Orthopedic Surgery

## 2023-03-14 DIAGNOSIS — M5416 Radiculopathy, lumbar region: Secondary | ICD-10-CM | POA: Diagnosis not present

## 2023-03-14 DIAGNOSIS — M5441 Lumbago with sciatica, right side: Secondary | ICD-10-CM | POA: Diagnosis not present

## 2023-03-14 DIAGNOSIS — M5127 Other intervertebral disc displacement, lumbosacral region: Secondary | ICD-10-CM | POA: Diagnosis not present

## 2023-03-14 DIAGNOSIS — M51372 Other intervertebral disc degeneration, lumbosacral region with discogenic back pain and lower extremity pain: Secondary | ICD-10-CM | POA: Diagnosis not present

## 2023-03-14 DIAGNOSIS — D1809 Hemangioma of other sites: Secondary | ICD-10-CM | POA: Diagnosis not present

## 2023-03-14 DIAGNOSIS — G8929 Other chronic pain: Secondary | ICD-10-CM | POA: Diagnosis not present

## 2023-03-15 NOTE — Progress Notes (Unsigned)
Referring Physician:  Mort Sawyers, FNP [address] [place],  [place] [address]  Primary Physician:  Mort Sawyers, FNP  History of Present Illness: 03/16/2023 [name] has a history of DM with neuropathy, hyperlipidemia, and obesity.   Last seen by  me on 02/23/23 for right sided LBP and right leg pain. She has possible trace retrolisthesis at L5-S1.   She was referred to neurology at [facility] for evaluation of LE neuropathy.   She is here to review her lumbar MRI results.   She has more constant LBP with intermittent bilateral posterior leg pain to her knees that is worse with bending, squatting, lifting- she has a 55 month old Deborah Pierce). LBP > leg pain, right leg pain = left leg pain. She has numbness, tingling, and weakness in her legs.   She has known diabetic neuropathy x years. Did not have EMG. Blood sugars run from 100s-300s. Last HgbA1c on 03/05/23 was 8.7 this improved from 12.5 on 10/26/22.   She is taking neurontin.   Bowel/Bladder Dysfunction: none, she has urinary urgency. No bowel issues. No perineal numbness.   Conservative measures:  Physical therapy: has not participated in  Multimodal medical therapy including regular antiinflammatories: tylenol, motrin, neurotin, zanaflex, lidocaine patches  Injections: has not received epidural steroid injections  Past Surgery: no previous spinal surgeries  Charlina Petrizzo has no symptoms of cervical myelopathy.  The symptoms are causing a significant impact on the patient's life.   Review of Systems:  A 10 point review of systems is negative, except for the pertinent positives and negatives detailed in the HPI.  Past Medical History: Past Medical History:  Diagnosis Date   Anxiety    Depression    Diabetes mellitus without complication (HCC)    Dysplastic nevus 02/20/2022   Severe, R to mid upper back paraspinal, Excised 07/21/22   Dysplastic nevus 02/20/2022   Severe, L mid back paraspinal, Excised  08/18/22   Dysplastic nevus 02/20/2022   mild, L lateral buttocks   Fractured elbow 2022   rt   Headache    Other fatigue 08/12/2021   Pregnancy induced hypertension 05/11/2022   Pt states they just noticed about a week ago.   Seasonal allergies 08/07/2011   UTI (urinary tract infection)     Past Surgical History: Past Surgical History:  Procedure Laterality Date   CESAREAN SECTION N/A 05/29/2022   Procedure: CESAREAN SECTION;  Surgeon: Kathrynn Running, MD;  Location: [facility] LD ORS;  Service: Obstetrics;  Laterality: N/A;   LAPAROSCOPY     looking for endometriosis    Allergies: Allergies as of 03/16/2023 - Review Complete 03/16/2023  Allergen Reaction Noted   Fruit extracts Itching and Other (See Comments) 05/28/2022   Insulins Nausea Only and Rash 04/20/2020   Sertraline Other (See Comments) 10/26/2022   Tree extract Itching 11/19/2021   Bee venom Rash 04/20/2020   Insulin degludec-liraglutide Rash 11/19/2021   Insulin glargine-lixisenatide Rash 11/19/2021   Penicillins Rash 04/20/2020   Sulfa antibiotics Rash 04/20/2020    Medications: Outpatient Encounter Medications as of 03/16/2023  Medication Sig   busPIRone (BUSPAR) 5 MG tablet TAKE 1 TABLET BY MOUTH TWICE A DAY   cetirizine (ZYRTEC ALLERGY) 10 MG tablet 1 tablet as needed Orally Once a day   Continuous Blood Gluc Sensor (DEXCOM G6 SENSOR) MISC Apply topically as directed.   Continuous Blood Gluc Transmit (DEXCOM G6 TRANSMITTER) MISC USE 1 TRANSMITTER TO MONITOR BLOOD SUGARS EVERY 3 MONTHS 90 DAYS  FLUoxetine (PROZAC) 40 MG capsule Take 1 capsule (40 mg total) by mouth daily.   gabapentin (NEURONTIN) 300 MG capsule Take one tablet in am (300 mg) and two tablets pm (600 mg)   Insulin Aspart FlexPen (NOVOLOG) 100 UNIT/ML Sliding scale insulin Less than 70 initiate hypoglycemia protocol 70-120  0 units 120-150 2 units 151-200 3 units 201-250 5units 251-300 8 units 301-350 11 units 351-400 15 units Greater than 400 call  MD   Insulin Disposable Pump (OMNIPOD 5 G6 PODS, GEN 5,) MISC SMARTSIG:SUB-Q Every Other Day   metFORMIN (GLUCOPHAGE) 1000 MG tablet TAKE 1 TABLET (1,000 MG TOTAL) BY MOUTH TWICE A DAY WITH FOOD   NIFEdipine (PROCARDIA-XL/NIFEDICAL-XL) 30 MG 24 hr tablet Take 30 mg by mouth daily.   norethindrone (MICRONOR) 0.35 MG tablet Take 1 tablet (0.35 mg total) by mouth daily.   NOVOLOG 100 UNIT/ML injection use up to 100 units daily subcutaneously daily in the pump for 30 days Ok to sub Humalog if insurance prefers   omega-3 acid ethyl esters (LOVAZA) 1 g capsule Take 2 capsules (2 g total) by mouth 2 (two) times daily.   pravastatin (PRAVACHOL) 10 MG tablet Take 1 tablet (10 mg total) by mouth daily.   tiZANidine (ZANAFLEX) 4 MG tablet TAKE 1/2 TO ONE TABLET BY MOUTH AT BEDTIME AS NEEDED MUSCLE SPASM   TRESIBA FLEXTOUCH 200 UNIT/ML FlexTouch Pen START WITH 40 UNITS DAILY AND GRADUALLY TITRATE UP AS DIRECTED TO 70 UNITS DAILY.   No facility-administered encounter medications on file as of 03/16/2023.    Social History: Social History   Tobacco Use   Smoking status: Never   Smokeless tobacco: Never  Vaping Use   Vaping status: Never Used  Substance Use Topics   Alcohol use: Not Currently   Drug use: Not Currently    Types: Marijuana    Family Medical History: Family History  Problem Relation Age of Onset   Hypertension Mother    Heart disease Mother    Skin cancer Mother        melanoma   Diabetes Mother    Diabetes type II Mother    Melanoma Mother    Diabetes Sister    Heart disease Maternal Grandmother    Hypertension Maternal Grandmother    Diabetes Maternal Grandmother    Lung cancer Maternal Grandfather    Brain cancer Maternal Grandfather    Asthma Neg Hx     Physical Examination: Vitals:   03/16/23 1503  BP: 126/80      Awake, alert, oriented to person, place, and time.  Speech is clear and fluent. Fund of knowledge is appropriate.   Cranial Nerves: Pupils equal  round and reactive to light.  Facial tone is symmetric.    Mild central lower posterior lumbar tenderness.   No abnormal lesions on exposed skin.   Strength:  Side Iliopsoas Quads Hamstring PF DF EHL  R 5 5 5 5 5 5   L 5 5 5 5 5 5    Bilateral lower extremity sensation is intact to light touch, slightly diminished in bilateral ankles into feet.   No pain with IR/ER of both hips.   Gait is normal.    Medical Decision Making  Imaging: Lumbar MRI dated 03/14/23:  FINDINGS: Segmentation: Five lumbar vertebrae are assumed and the caudal-most well-formed intervertebral disc space is designated L5-S1.   Alignment:  No significant spondylolisthesis.   Vertebrae: Lumbar vertebral body height is maintained. No significant marrow edema or focal worrisome marrow lesion. Small  hemangioma within the L2 vertebral body.   Conus medullaris and cauda equina: Conus extends to the T12-L1 level. No signal abnormality identified within the visualized distal spinal cord.   Paraspinal and other soft tissues: No abnormality identified within included portions of the abdomen/retroperitoneum. No paraspinal mass or collection.   Disc levels:   Mild disc degeneration at L5-S1. Intervertebral disc height and hydration are largely preserved at the remaining lumbar levels.   T12-L1: No significant disc herniation or stenosis.   L1-L2: No significant disc herniation or stenosis.   L2-L3: No significant disc herniation or stenosis.   L3-L4: No significant disc herniation or stenosis.   L4-L5: Minimal ligamentum flavum hypertrophy. No significant disc herniation or stenosis.   L5-S1: A small right subarticular disc protrusion results in mild right subarticular narrowing (and abuts the descending right S1 nerve root) (series 8, image 29). No significant central canal stenosis or neural foraminal narrowing.   IMPRESSION: 1. At L5-S1, there is mild disc degeneration. A small right subarticular  disc protrusion results in mild right subarticular narrowing (and abuts the descending right S1 nerve root). Correlate for right S1 radiculopathy. 2. At L4-L5, there is minimal ligamentum flavum hypertrophy without significant spinal canal stenosis.     Electronically Signed   By: Jackey Loge D.O.   On: 03/14/2023 11:29  I have personally reviewed the images and agree with the above interpretation.  Assessment and Plan: Ms. Strelow has more constant LBP with intermittent bilateral posterior leg pain to her knees that is worse with bending, squatting, lifting- she has a 77 month old Deborah Pierce). LBP > leg pain, right leg pain = left leg pain. She has numbness, tingling, and weakness in her legs.   She has possible trace retrolisthesis at L5-S1. Also with diffuse lumbar spondylosis and small right sided disc at L5-S1 that abuts the descending right S1 nerve. LBP is likely due to spondylosis. Right leg pain  may be from small disc at L5-S1, not sure of cause of left leg pain.    Treatment options discussed with patient and following plan made:   - PT for lumbar spine. [facility] to [facility].  - Follow up with neurology as scheduled for evaluation of LE neuropathy.  - Blood sugars are under better control, but would still try to avoid injections at this point. May need to revisit if no improvement with PT.  - Phone follow up with me in 6-8 weeks to check on her progress.   I spent a total of 15 minutes in face-to-face and non-face-to-face activities related to this patient's care today including review of outside records, review of imaging, review of symptoms, physical exam, discussion of differential diagnosis, discussion of treatment options, and documentation.   Drake Leach PA-C Dept. of Neurosurgery

## 2023-03-16 ENCOUNTER — Encounter: Payer: Self-pay | Admitting: Orthopedic Surgery

## 2023-03-16 ENCOUNTER — Ambulatory Visit (INDEPENDENT_AMBULATORY_CARE_PROVIDER_SITE_OTHER): Payer: Medicaid Other | Admitting: Orthopedic Surgery

## 2023-03-16 VITALS — BP 126/80 | Ht 67.0 in | Wt 262.0 lb

## 2023-03-16 DIAGNOSIS — M4726 Other spondylosis with radiculopathy, lumbar region: Secondary | ICD-10-CM

## 2023-03-16 DIAGNOSIS — M5117 Intervertebral disc disorders with radiculopathy, lumbosacral region: Secondary | ICD-10-CM

## 2023-03-16 DIAGNOSIS — M5416 Radiculopathy, lumbar region: Secondary | ICD-10-CM

## 2023-03-16 DIAGNOSIS — M47816 Spondylosis without myelopathy or radiculopathy, lumbar region: Secondary | ICD-10-CM

## 2023-03-25 ENCOUNTER — Ambulatory Visit (HOSPITAL_COMMUNITY): Payer: No Typology Code available for payment source

## 2023-03-26 ENCOUNTER — Ambulatory Visit
Admission: EM | Admit: 2023-03-26 | Discharge: 2023-03-26 | Disposition: A | Discharge status: Home / Self Care | Payer: No Typology Code available for payment source | Attending: Emergency Medicine | Admitting: Emergency Medicine

## 2023-03-26 DIAGNOSIS — H00011 Hordeolum externum right upper eyelid: Secondary | ICD-10-CM

## 2023-03-26 MED ORDER — POLYMYXIN B-TRIMETHOPRIM 10000-0.1 UNIT/ML-% OP SOLN: 1.0000 [drp] | Freq: Four times a day (QID) | OPHTHALMIC | 0 refills | Status: AC

## 2023-03-26 NOTE — Discharge Instructions (Addendum)
Use the antibiotic eyedrops as prescribed.    Follow-up with your primary care provider if your symptoms are not improving.    Go to the emergency department if you have acute eye pain, changes in your vision, or other concerning symptoms.    

## 2023-03-26 NOTE — ED Provider Notes (Signed)
Deborah Pierce    CSN: [id number] Arrival date & time: 03/26/23  1040      History   Chief Complaint Chief Complaint  Patient presents with   Stye    HPI Deborah Pierce is a 32 y.o. female.  Patient presents with tender swelling and redness of left upper eyelid x 4 days.  Treatment attempted with warm compresses.  No eye trauma, change in vision, eye drainage, fever.  Her medical history includes diabetes, neuropathy, hyperlipidemia.  The history is provided by the patient and medical records.    Past Medical History:  Diagnosis Date   Anxiety    Depression    Diabetes mellitus without complication (HCC)    Dysplastic nevus 02/20/2022   Severe, R to mid upper back paraspinal, Excised 07/21/22   Dysplastic nevus 02/20/2022   Severe, L mid back paraspinal, Excised 08/18/22   Dysplastic nevus 02/20/2022   mild, L lateral buttocks   Fractured elbow 2022   rt   Headache    Other fatigue 08/12/2021   Pregnancy induced hypertension 05/11/2022   Pt states they just noticed about a week ago.   Seasonal allergies 08/07/2011   UTI (urinary tract infection)     Patient Active Problem List   Diagnosis Date Noted   Acute right-sided low back pain with right-sided sciatica 02/04/2023   Peripheral neuropathy 02/04/2023   Type 2 diabetes mellitus with diabetic neuropathy, with long-term current use of insulin (HCC) 02/04/2023   Retrolisthesis of vertebrae 02/04/2023   Glucosuria 12/30/2022   Class 1 obesity due to excess calories with serious comorbidity and body mass index (BMI) of 34.0 to 34.9 in adult 10/26/2022   Dysplastic nevus 04/16/2022   Mixed hyperlipidemia 12/17/2021   Vitamin D deficiency 12/17/2021   Controlled type 2 diabetes mellitus without complication, with long-term current use of insulin (HCC) 08/12/2021   Diabetic peripheral neuropathy (HCC) 08/12/2021   Depression with anxiety 08/12/2021    Past Surgical History:  Procedure Laterality Date    CESAREAN SECTION N/A 05/29/2022   Procedure: CESAREAN SECTION;  Surgeon: Kathrynn Running, MD;  Location: [facility] LD ORS;  Service: Obstetrics;  Laterality: N/A;   LAPAROSCOPY     looking for endometriosis    OB History     Gravida  2   Para  1   Term  0   Preterm  1   AB  1   Living  1      SAB  1   IAB      Ectopic      Multiple  0   Live Births  1        Obstetric Comments  March 2022 spontaneous abortion/miscarriage          Home Medications    Prior to Admission medications   Medication Sig Start Date End Date Taking? Authorizing Provider  trimethoprim-polymyxin b (POLYTRIM) ophthalmic solution Place 1 drop into the right eye 4 (four) times daily for 7 days. 03/26/23 04/02/23 Yes Mickie Bail, NP  busPIRone (BUSPAR) 5 MG tablet TAKE 1 TABLET BY MOUTH TWICE A DAY 11/19/22   Mort Sawyers, FNP  cetirizine (ZYRTEC ALLERGY) 10 MG tablet 1 tablet as needed Orally Once a day    [provider]  Continuous Blood Gluc Sensor (DEXCOM G6 SENSOR) MISC Apply topically as directed. 06/27/22   [provider]  Continuous Blood Gluc Transmit (DEXCOM G6 TRANSMITTER) MISC USE 1 TRANSMITTER TO MONITOR BLOOD SUGARS EVERY 3 MONTHS 90 DAYS  [provider]  FLUoxetine (PROZAC) 40 MG capsule Take 1 capsule (40 mg total) by mouth daily. 11/17/22   Mort Sawyers, FNP  gabapentin (NEURONTIN) 300 MG capsule Take one tablet in am (300 mg) and two tablets pm (600 mg) 02/04/23   Mort Sawyers, FNP  Insulin Aspart FlexPen (NOVOLOG) 100 UNIT/ML Sliding scale insulin Less than 70 initiate hypoglycemia protocol 70-120  0 units 120-150 2 units 151-200 3 units 201-250 5units 251-300 8 units 301-350 11 units 351-400 15 units Greater than 400 call MD 04/22/20   [provider]  Insulin Disposable Pump (OMNIPOD 5 G6 PODS, GEN 5,) MISC SMARTSIG:SUB-Q Every Other Day    [provider]  metFORMIN (GLUCOPHAGE) 1000 MG tablet TAKE 1 TABLET (1,000 MG TOTAL)  BY MOUTH TWICE A DAY WITH FOOD 07/20/22   Anyanwu, Jethro Bastos, MD  NIFEdipine (PROCARDIA-XL/NIFEDICAL-XL) 30 MG 24 hr tablet Take 30 mg by mouth daily.    [provider]  norethindrone (MICRONOR) 0.35 MG tablet Take 1 tablet (0.35 mg total) by mouth daily. 07/07/22   Anyanwu, Jethro Bastos, MD  NOVOLOG 100 UNIT/ML injection use up to 100 units daily subcutaneously daily in the pump for 30 days Ok to sub Humalog if insurance prefers 02/04/22   [provider]  omega-3 acid ethyl esters (LOVAZA) 1 g capsule Take 2 capsules (2 g total) by mouth 2 (two) times daily. 03/08/23 06/06/23  Mort Sawyers, FNP  pravastatin (PRAVACHOL) 10 MG tablet Take 1 tablet (10 mg total) by mouth daily. 11/17/22   Mort Sawyers, FNP  tiZANidine (ZANAFLEX) 4 MG tablet TAKE 1/2 TO ONE TABLET BY MOUTH AT BEDTIME AS NEEDED MUSCLE SPASM 03/01/23   Dugal, Tabitha, FNP  TRESIBA FLEXTOUCH 200 UNIT/ML FlexTouch Pen START WITH 40 UNITS DAILY AND GRADUALLY TITRATE UP AS DIRECTED TO 70 UNITS DAILY. 09/16/20   [provider]    Family History Family History  Problem Relation Age of Onset   Hypertension Mother    Heart disease Mother    Skin cancer Mother        melanoma   Diabetes Mother    Diabetes type II Mother    Melanoma Mother    Diabetes Sister    Heart disease Maternal Grandmother    Hypertension Maternal Grandmother    Diabetes Maternal Grandmother    Lung cancer Maternal Grandfather    Brain cancer Maternal Grandfather    Asthma Neg Hx     Social History Social History   Tobacco Use   Smoking status: Never   Smokeless tobacco: Never  Vaping Use   Vaping status: Never Used  Substance Use Topics   Alcohol use: Not Currently   Drug use: Not Currently    Types: Marijuana     Allergies   Fruit extracts, Insulins, Sertraline, Tree extract, Bee venom, Insulin degludec-liraglutide, Insulin glargine-lixisenatide, Penicillins, and Sulfa antibiotics   Review of Systems Review of Systems   Constitutional:  Negative for chills and fever.  HENT:  Negative for ear pain and sore throat.   Eyes:  Positive for pain and redness. Negative for discharge, itching and visual disturbance.  Respiratory:  Negative for cough and shortness of breath.      Physical Exam Triage Vital Signs ED Triage Vitals  Encounter Vitals Group     BP 03/26/23 1144 139/89     Systolic BP Percentile --      Diastolic BP Percentile --      Pulse Rate 03/26/23 1144 (!) 107  Resp 03/26/23 1144 18     Temp 03/26/23 1144 97.7 F (36.5 C)     Temp src --      SpO2 03/26/23 1144 96 %     Weight --      Height --      Head Circumference --      Peak Flow --      Pain Score 03/26/23 1148 6     Pain Loc --      Pain Education --      Exclude from Growth Chart --    No data found.  Updated Vital Signs BP 139/89   Pulse (!) 107   Temp 97.7 F (36.5 C)   Resp 18   LMP 03/12/2023   SpO2 96%   Visual Acuity Right Eye Distance: 20/16 Left Eye Distance: 20/25 Bilateral Distance: 20/16  Right Eye Near:   Left Eye Near:    Bilateral Near:     Physical Exam HENT:     Mouth/Throat:     Mouth: Mucous membranes are moist.  Eyes:     General: Vision grossly intact.        Right eye: No discharge.        Left eye: No discharge.     Extraocular Movements: Extraocular movements intact.     Conjunctiva/sclera:     Right eye: Right conjunctiva is not injected.     Left eye: Left conjunctiva is not injected.     Pupils: Pupils are equal, round, and reactive to light.   Cardiovascular:     Rate and Rhythm: Normal rate and regular rhythm.  Pulmonary:     Effort: Pulmonary effort is normal. No respiratory distress.  Skin:    General: Skin is warm and dry.      UC Treatments / Results  Labs (all labs ordered are listed, but only abnormal results are displayed) Labs Reviewed - No data to display  EKG   Radiology No results found.  Procedures Procedures (including critical care  time)  Medications Ordered in UC Medications - No data to display  Initial Impression / Assessment and Plan / UC Course  I have reviewed the triage vital signs and the nursing notes.  Pertinent labs & imaging results that were available during my care of the patient were reviewed by me and considered in my medical decision making (see chart for details).    Hordeolum of right upper eyelid.  Treating today with Polytrim eyedrops.  Continue warm compresses.  Education provided on stye.  Instructed patient to follow-up with her PCP if she is not improving.  ED precautions given.  She agrees to plan of care.   Final Clinical Impressions(s) / UC Diagnoses   Final diagnoses:  Hordeolum externum of right upper eyelid     Discharge Instructions      Use the antibiotic eyedrops as prescribed.    Follow-up with your primary care provider if your symptoms are not improving.    Go to the emergency department if you have acute eye pain, changes in your vision, or other concerning symptoms.        ED Prescriptions     Medication Sig Dispense Auth. Provider   trimethoprim-polymyxin b (POLYTRIM) ophthalmic solution Place 1 drop into the right eye 4 (four) times daily for 7 days. 10 mL Mickie Bail, NP      PDMP not reviewed this encounter.   Mickie Bail, NP 03/26/23 [phone]

## 2023-03-26 NOTE — ED Triage Notes (Signed)
Patient to Urgent Care with complaints of left upper eyelid swelling/ redness.   Reports stye developed approx 4 days ago. No drainage.   Has been using hot compresses/ cleaning with aveno baby wash/ taking ibuprofen.

## 2023-04-08 ENCOUNTER — Other Ambulatory Visit: Payer: Self-pay

## 2023-04-08 ENCOUNTER — Ambulatory Visit: Payer: No Typology Code available for payment source | Attending: Orthopedic Surgery

## 2023-04-08 DIAGNOSIS — M5416 Radiculopathy, lumbar region: Secondary | ICD-10-CM | POA: Diagnosis present

## 2023-04-08 DIAGNOSIS — M6281 Muscle weakness (generalized): Secondary | ICD-10-CM | POA: Diagnosis present

## 2023-04-08 DIAGNOSIS — R293 Abnormal posture: Secondary | ICD-10-CM | POA: Insufficient documentation

## 2023-04-08 DIAGNOSIS — M47816 Spondylosis without myelopathy or radiculopathy, lumbar region: Secondary | ICD-10-CM | POA: Insufficient documentation

## 2023-04-08 NOTE — Therapy (Signed)
OUTPATIENT PHYSICAL THERAPY THORACOLUMBAR EVALUATION   Patient Name: Tsuyako Schnackenberg MRN: [id number] DOB:[birth year], 32 y.o., female Today's Date: 04/08/2023  END OF SESSION:  PT End of Session - 04/08/23 1111     Visit Number 1    Date for PT Re-Evaluation 06/10/23    Authorization Type [employer] primary; [employer] Secondary    PT Start Time 1108    PT Stop Time 1150    PT Time Calculation (min) 42 min    Activity Tolerance Patient tolerated treatment well    Behavior During Therapy WFL for tasks assessed/performed             Past Medical History:  Diagnosis Date   Anxiety    Depression    Diabetes mellitus without complication (HCC)    Dysplastic nevus 02/20/2022   Severe, R to mid upper back paraspinal, Excised 07/21/22   Dysplastic nevus 02/20/2022   Severe, L mid back paraspinal, Excised 08/18/22   Dysplastic nevus 02/20/2022   mild, L lateral buttocks   Fractured elbow 2022   rt   Headache    Other fatigue 08/12/2021   Pregnancy induced hypertension 05/11/2022   Pt states they just noticed about a week ago.   Seasonal allergies 08/07/2011   UTI (urinary tract infection)    Past Surgical History:  Procedure Laterality Date   CESAREAN SECTION N/A 05/29/2022   Procedure: CESAREAN SECTION;  Surgeon: Kathrynn Running, MD;  Location: [facility];  Service: Obstetrics;  Laterality: N/A;   LAPAROSCOPY     looking for endometriosis   Patient Active Problem List   Diagnosis Date Noted   Acute right-sided low back pain with right-sided sciatica 02/04/2023   Peripheral neuropathy 02/04/2023   Type 2 diabetes mellitus with diabetic neuropathy, with long-term current use of insulin (HCC) 02/04/2023   Retrolisthesis of vertebrae 02/04/2023   Glucosuria 12/30/2022   Class 1 obesity due to excess calories with serious comorbidity and body mass index (BMI) of 34.0 to 34.9 in adult 10/26/2022   Dysplastic nevus 04/16/2022   Mixed hyperlipidemia 12/17/2021   Vitamin D  deficiency 12/17/2021   Controlled type 2 diabetes mellitus without complication, with long-term current use of insulin (HCC) 08/12/2021   Diabetic peripheral neuropathy (HCC) 08/12/2021   Depression with anxiety 08/12/2021    PCP: Mort Sawyers  REFERRING PROVIDER: Drake Leach  REFERRING DIAG: Lumbar radiculopathy  Rationale for Evaluation and Treatment: Rehabilitation  THERAPY DIAG:  Radiculopathy, lumbar region  Generalized muscle weakness  Abnormal posture  ONSET DATE: Patient reports initial onset of pain 2-3 months ago  SUBJECTIVE:  SUBJECTIVE STATEMENT: Patient reports insidious onset of pain. Patient states that she had been seeing a chiropractor for 2-3 times a week for the past several months. States she didn't have any issues with seeing the chiropractor then one day she started having bad back pain. States that her pain has steadily gotten worse over the past 2-3 months. MRI done on 03/14/2023 with no significant findings. Mild lumbar degeneration and retrolisthesis.   PERTINENT HISTORY:  Diabetes, HTN, diabetic neuropathy  PAIN:  Are you having pain? Yes: NPRS scale: 0-8 Pain location: Low back; right LE and some times left LE Pain description: sharp, stabbing pain; throbbing ache/dull pain in leg and at rest Aggravating factors: returning to standing after bending/stooping forward, walking more than 20-30 minutes, squatting, standing/doing dishes for 10-15 minutes Relieving factors: ice, heat, forward flexion, tylenol, aleve  PRECAUTIONS: None  RED FLAGS: Bowel or bladder incontinence: Yes: bladder since initial injury. Reports every few days having issues with incontinence    WEIGHT BEARING RESTRICTIONS: No  FALLS:  Has patient fallen in last 6 months? No  LIVING  ENVIRONMENT: Lives with: lives with their family Lives in: House/apartment Stairs: Yes: External: 3 steps; none Has following equipment at home: None  OCCUPATION: Currently off work; was shift supervisor at [employer]  PLOF: Independent  PATIENT GOALS: Improve pain, improve ability to perform ADLs and child care duties without pain. Return to work without pain  NEXT MD VISIT: 04/13/2023 telehealth visit  OBJECTIVE:  Note: Objective measures were completed at Evaluation unless otherwise noted.  DIAGNOSTIC FINDINGS:  Patient with signs/symptoms consistent with lumbar radiculopathy and spondylolisthesis  PATIENT SURVEYS:  Modified Oswestry Will assess next session   COGNITION: Overall cognitive status: Within functional limits for tasks assessed     SENSATION: WFL   POSTURE: rounded shoulders, forward head, increased lumbar lordosis, and flexed trunk   PALPATION: TTP right lumbar paraspinals, right glute med. Hypomobility with pain right UPAs L4-S1  LUMBAR ROM:   AROM eval  Flexion WNL; pain with return to standing  Extension Limited but no increase in pain  Right lateral flexion WNL  Left lateral flexion WNL  Right rotation WNL  Left rotation WNL   (Blank rows = not tested)  LUMBAR SPECIAL TESTS:  Prone instability test: Positive and Slump test: Negative  FUNCTIONAL TESTS:  30 seconds chair stand test 6 reps with intermittent use of UE  GAIT: Distance walked: 100 feet Assistive device utilized: None Level of assistance: Complete Independence Comments: Slowed gait speed. Left sided weight shift. Increased lateral sway  TREATMENT DATE: 04/08/2023                                                                                                                                 PATIENT EDUCATION:  Education details: Discussed objective findings with patient. Discussed POC including HEP, frequency, and anatomy/physiology of present condition Person educated:  Patient Education method: Explanation, Demonstration, and Handouts Education comprehension:  verbalized understanding, returned demonstration, and needs further education  HOME EXERCISE PROGRAM: Access Code: [email or web address] URL: [email or web address] Date: 04/08/2023 Prepared by: Rinaldo Ratel Dervin Vore  Exercises - Supine Posterior Pelvic Tilt  - 2 x daily - 7 x weekly - 2 sets - 10 reps - Hooklying Gluteal Sets  - 2 x daily - 7 x weekly - 3 sets - 10 reps - Seated March  - 2 x daily - 7 x weekly - 3 sets - 10 reps - Standing Lumbar Extension with Counter  - 2 x daily - 7 x weekly - 3 sets - 10 reps  ASSESSMENT:  CLINICAL IMPRESSION: Patient is a 32 y.o. female who was seen today for physical therapy evaluation and treatment for lumbar radiculopathy and spondylolisthesis. Patient presents to therapy with moderate point tenderness of right lumbar paraspinals, QL, and glute med. Also shows hypomobility and point tenderness with right UPAs L4-S1. Patient also shows poor lifting and squatting mechanics. Also demonstrates forward flexion preference with reproduction of pain with lifting and returning to standing position. Patient unable to perform work tasks and child care duties without restrictions. Patient requires continued skilled PT services to address deficits and return to prior level of function.   OBJECTIVE IMPAIRMENTS: decreased mobility, difficulty walking, decreased ROM, decreased strength, and pain.   ACTIVITY LIMITATIONS: carrying, lifting, bending, sitting, and squatting  PARTICIPATION LIMITATIONS: meal prep, cleaning, laundry, and occupation  PERSONAL FACTORS: 1-2 comorbidities: neuropathy and diabetes  are also affecting patient's functional outcome.   REHAB POTENTIAL: Good  CLINICAL DECISION MAKING: Stable/uncomplicated  EVALUATION COMPLEXITY: Low   GOALS: Goals reviewed with patient? No  SHORT TERM GOALS: Target date: 05/06/2023    Patient will be  independent with HEP to improve carryover of sessions Baseline: See above Goal status: INITIAL  2.  Patient will be able to stand greater than 20 minutes without increase in pain to perform house cleaning chores Baseline: Pain with more than 10-15 minutes of standing Goal status: INITIAL  3.  Patient will be able to squat and lift at least 20lbs without pain to lift son out of crib Baseline: Pain with all squats.  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  Patient will be able to stand and do dishes greater than 1 hour for household chores and ADLs Baseline: Pain with 10-15 minutes of standing Goal status: INITIAL  3.  Patient will be able to walk greater than 45 minutes without pain to complete work tasks Baseline: Pain with more than 20 minutes of walking Goal status: INITIAL  PLAN:  PT FREQUENCY: 1-2x/week  PT DURATION: 8 weeks  PLANNED INTERVENTIONS: 97164- PT Re-evaluation, 97110-Therapeutic exercises, 97530- Therapeutic activity, 97112- Neuromuscular re-education, 97535- Self Care, 16109- Manual therapy, [id number]- Gait training, [phone]- Aquatic Therapy, Balance training, Stair training, Taping, Dry Needling, Joint mobilization, Joint manipulation, Spinal manipulation, and Spinal mobilization.  PLAN FOR NEXT SESSION: Continue PT services   Erskine Emery [place] Graefe, PT, DPT 04/08/2023, 12:32 PM

## 2023-04-30 NOTE — Therapy (Signed)
OUTPATIENT PHYSICAL THERAPY TREATMENT NOTE   Patient Name: Deborah Pierce MRN: [id number] DOB:1990-09-23, 33 y.o., female Today's Date: 05/01/2023  END OF SESSION:  PT End of Session - 05/01/23 0813     Visit Number 2    Date for PT Re-Evaluation 06/10/23    Authorization Type [employer] primary; [employer] Secondary    PT Start Time [phone]    PT Stop Time [phone]    PT Time Calculation (min) 40 min    Activity Tolerance Patient tolerated treatment well    Behavior During Therapy WFL for tasks assessed/performed             Past Medical History:  Diagnosis Date   Anxiety    Depression    Diabetes mellitus without complication (HCC)    Dysplastic nevus 02/20/2022   Severe, R to mid upper back paraspinal, Excised 07/21/22   Dysplastic nevus 02/20/2022   Severe, L mid back paraspinal, Excised 08/18/22   Dysplastic nevus 02/20/2022   mild, L lateral buttocks   Fractured elbow 2022   rt   Headache    Other fatigue 08/12/2021   Pregnancy induced hypertension 05/11/2022   Pt states they just noticed about a week ago.   Seasonal allergies 08/07/2011   UTI (urinary tract infection)    Past Surgical History:  Procedure Laterality Date   CESAREAN SECTION N/A 05/29/2022   Procedure: CESAREAN SECTION;  Surgeon: Kathrynn Running, MD;  Location: [facility];  Service: Obstetrics;  Laterality: N/A;   LAPAROSCOPY     looking for endometriosis   Patient Active Problem List   Diagnosis Date Noted   Acute right-sided low back pain with right-sided sciatica 02/04/2023   Peripheral neuropathy 02/04/2023   Type 2 diabetes mellitus with diabetic neuropathy, with long-term current use of insulin (HCC) 02/04/2023   Retrolisthesis of vertebrae 02/04/2023   Glucosuria 12/30/2022   Class 1 obesity due to excess calories with serious comorbidity and body mass index (BMI) of 34.0 to 34.9 in adult 10/26/2022   Dysplastic nevus 04/16/2022   Mixed hyperlipidemia 12/17/2021   Vitamin D deficiency  12/17/2021   Controlled type 2 diabetes mellitus without complication, with long-term current use of insulin (HCC) 08/12/2021   Diabetic peripheral neuropathy (HCC) 08/12/2021   Depression with anxiety 08/12/2021    PCP: Mort Sawyers  REFERRING PROVIDER: Drake Leach  REFERRING DIAG: Lumbar radiculopathy  Rationale for Evaluation and Treatment: Rehabilitation  THERAPY DIAG:  Radiculopathy, lumbar region  Generalized muscle weakness  Abnormal posture  ONSET DATE: Patient reports initial onset of pain 2-3 months ago  SUBJECTIVE:  SUBJECTIVE STATEMENT: Patient reports that she continues to have pain in her lower back, especially when doing things like washing dishes.  EVAL: Patient reports insidious onset of pain. Patient states that she had been seeing a chiropractor for 2-3 times a week for the past several months. States she didn't have any issues with seeing the chiropractor then one day she started having bad back pain. States that her pain has steadily gotten worse over the past 2-3 months. MRI done on 03/14/2023 with no significant findings. Mild lumbar degeneration and retrolisthesis.   PERTINENT HISTORY:  Diabetes, HTN, diabetic neuropathy  PAIN:  Are you having pain? Yes: NPRS scale: 0-8 Pain location: Low back; right LE and some times left LE Pain description: sharp, stabbing pain; throbbing ache/dull pain in leg and at rest Aggravating factors: returning to standing after bending/stooping forward, walking more than 20-30 minutes, squatting, standing/doing dishes for 10-15 minutes Relieving factors: ice, heat, forward flexion, tylenol, aleve  PRECAUTIONS: None  RED FLAGS: Bowel or bladder incontinence: Yes: bladder since initial injury. Reports every few days having issues with  incontinence    WEIGHT BEARING RESTRICTIONS: No  FALLS:  Has patient fallen in last 6 months? 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 Return to work without pain  NEXT MD VISIT: 05/14/23 telehealth visit  OBJECTIVE:  Note: Objective measures were completed at Evaluation unless otherwise noted.  DIAGNOSTIC FINDINGS:  Patient with signs/symptoms consistent with lumbar radiculopathy and spondylolisthesis  PATIENT SURVEYS:  Modified Oswestry Will assess next session  05/01/23: 12/50 24%  COGNITION: Overall cognitive status: Within functional limits for tasks assessed     SENSATION: WFL   POSTURE: rounded shoulders, forward head, increased lumbar lordosis, and flexed trunk   PALPATION: TTP right lumbar paraspinals, right glute med. Hypomobility with pain right UPAs L4-S1  LUMBAR ROM:   AROM eval  Flexion WNL; pain with return to standing  Extension Limited but no increase in pain  Right lateral flexion WNL  Left lateral flexion WNL  Right rotation WNL  Left rotation WNL   (Blank rows = not tested)  LUMBAR SPECIAL TESTS:  Prone instability test: Positive and Slump test: Negative  FUNCTIONAL TESTS:  30 seconds chair stand test 6 reps with intermittent use of UE  GAIT: Distance walked: 100 feet Assistive device utilized: None Level of assistance: Complete Independence Comments: Slowed gait speed. Left sided weight shift. Increased lateral sway  TREATMENT DATE:  [facility] Adult PT Treatment:                                                DATE: 05/01/23 Therapeutic Exercise: Nustep level 4 x 5 mins while gathering subjective and planning session with patient Seated  marching with TA activation 2x30" Seated hamstring stretch 2x30" BIL POE x3' Prone press ups 2x10 Supine PPT 3" hold 2x10 Supine sciatic nerve glide x20 BIL Bridges 2x10 LTR x2' Seated pball roll outs x10 ea fwd/lat Therapeutic Activity: Modified Oswestry 12/50 24%   04/08/2023  PATIENT EDUCATION:  Education details: Discussed objective findings with patient. Discussed POC including HEP, frequency, and anatomy/physiology of present condition Person educated: Patient Education method: Explanation, Demonstration, and Handouts Education comprehension: verbalized understanding, returned demonstration, and needs further education  HOME EXERCISE PROGRAM: Access Code: [email or web address] URL: [email or web address] Date: 04/08/2023 Prepared by: Rinaldo Ratel Diy  Exercises - Supine Posterior Pelvic Tilt  - 2 x daily - 7 x weekly - 2 sets - 10 reps - Hooklying Gluteal Sets  - 2 x daily - 7 x weekly - 3 sets - 10 reps - Seated March  - 2 x daily - 7 x weekly - 3 sets - 10 reps - Standing Lumbar Extension with Counter  - 2 x daily - 7 x weekly - 3 sets - 10 reps  ASSESSMENT:  CLINICAL IMPRESSION: Patient presents to PT reporting no current pain and that her pain mostly occurs when doing things like dishes or that require leaning forward. She reports HEP compliance and that she is no longer seeing chiropractic. Administered Modified ODI today with patient scoring 12/50, 24% perceived disability. Session today focused on core strengthening and lumbar mobility exercises. Patient was able to tolerate all prescribed exercises with no adverse effects. Patient continues to benefit from skilled PT services and should be progressed as able to improve functional independence.   EVAL: Patient is a 33 y.o. female who was seen today for physical therapy evaluation and  treatment for lumbar radiculopathy and spondylolisthesis. Patient presents to therapy with moderate point tenderness of right lumbar paraspinals, QL, and glute med. Also shows hypomobility and point tenderness with right UPAs L4-S1. Patient also shows poor lifting and squatting mechanics. Also demonstrates forward flexion preference with reproduction of pain with lifting and returning to standing position. Patient unable to perform work tasks and child care duties without restrictions. Patient requires continued skilled PT services to address deficits and return to prior level of function.   OBJECTIVE IMPAIRMENTS: decreased mobility, difficulty walking, decreased ROM, decreased strength, and pain.   ACTIVITY LIMITATIONS: carrying, lifting, bending, sitting, and squatting  PARTICIPATION LIMITATIONS: meal prep, cleaning, laundry, and occupation  PERSONAL FACTORS: 1-2 comorbidities: neuropathy and diabetes  are also affecting patient's functional outcome.   REHAB POTENTIAL: Good  CLINICAL DECISION MAKING: Stable/uncomplicated  EVALUATION COMPLEXITY: Low   GOALS: Goals reviewed with patient? No  SHORT TERM GOALS: Target date: 05/06/2023   Patient will be independent with HEP to improve carryover of sessions Baseline: See above Goal status: INITIAL  2.  Patient will be able to stand greater than 20 minutes without increase in pain to perform house cleaning chores Baseline: Pain with more than 10-15 minutes of standing Goal status: INITIAL  3.  Patient will be able to squat and lift at least 20lbs without pain to lift son out of crib Baseline: Pain with all squats.  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  Patient will be able to stand and do dishes  greater than 1 hour for household chores and ADLs Baseline: Pain with 10-15 minutes of standing Goal status: INITIAL  3.  Patient will be able to walk greater than 45 minutes without pain to complete work tasks Baseline: Pain with more than 20 minutes of  walking Goal status: INITIAL  PLAN:  PT FREQUENCY: 1-2x/week  PT DURATION: 8 weeks  PLANNED INTERVENTIONS: 97164- PT Re-evaluation, 97110-Therapeutic exercises, 97530- Therapeutic activity, 97112- Neuromuscular re-education, 97535- Self Care, 40981- Manual therapy, [phone]- Gait training, [phone]- Aquatic Therapy, Balance training, Stair training, Taping, Dry Needling, Joint mobilization, Joint manipulation, Spinal manipulation, and Spinal mobilization.  PLAN FOR NEXT SESSION: Continue PT services   Berta Minor, PTA 05/01/2023, 8:56 AM

## 2023-05-01 ENCOUNTER — Ambulatory Visit: Payer: No Typology Code available for payment source | Attending: Orthopedic Surgery

## 2023-05-01 DIAGNOSIS — M6281 Muscle weakness (generalized): Secondary | ICD-10-CM | POA: Diagnosis present

## 2023-05-01 DIAGNOSIS — R293 Abnormal posture: Secondary | ICD-10-CM | POA: Diagnosis present

## 2023-05-01 DIAGNOSIS — M5416 Radiculopathy, lumbar region: Secondary | ICD-10-CM | POA: Insufficient documentation

## 2023-05-13 NOTE — Progress Notes (Signed)
Telephone Visit- Progress Note: Referring Physician:  Mort Sawyers, FNP [address] [place],  [place] [address]  Primary Physician:  Mort Sawyers, FNP  This visit was performed via telephone.  Patient location: home Provider location: office  I spent a total of 10 minutes non-face-to-face activities for this visit on the date of this encounter including review of current clinical condition and response to treatment.    Patient has given verbal consent to this telephone visits and we reviewed the limitations of a telephone visit. Patient wishes to proceed.    Chief Complaint:  follow up  History of Present Illness: Deborah Pierce is a 33 y.o. female has a history of  DM with neuropathy, hyperlipidemia, and obesity.    Last seen by  me on 03/16/23 for LBP and intermittent bilateral leg pain. She has possible trace retrolisthesis at L5-S1. Also with diffuse lumbar spondylosis and small right sided disc at L5-S1 that abuts the descending right S1 nerve. LBP is likely due to spondylosis. Right leg pain  may be from small disc at L5-S1, not sure of cause of left leg pain.    She was sent to PT at last visit. Injections discussed but not recommended as blood sugars still not under great control.   Phone visit scheduled to check on her progress.   She's done 2 visits of PT and feels like it is helping. She has intermittent LBP with intermittent bilateral posterior leg pain to her knees that is worse with bending, squatting, lifting. Her leg pain is much less frequent. She has numbness, tingling in her legs (has known diabetic neuropathy). No weakness.    Blood sugars run from 100s-300s. Last HgbA1c on 03/05/23 was 8.7 this improved from 12.5 on 10/26/22. No recent HgbA1c.    She is taking neurontin and it was just increased by neurology- she saw them for her neuropathy.    Bowel/Bladder Dysfunction: none, she has urinary urgency. No bowel issues. No perineal numbness.     Conservative measures:  Physical therapy: has not participated in  Multimodal medical therapy including regular antiinflammatories: tylenol, motrin, neurotin, zanaflex, lidocaine patches  Injections: has not received epidural steroid injections   Past Surgery: no previous spinal surgeries   Deborah Pierce has no symptoms of cervical myelopathy.   The symptoms are causing a significant impact on the patient's life.   Exam: No exam done as this was a telephone encounter.     Imaging: Nothing new to review.    Assessment and Plan: She's done 2 visits of PT and feels like it is helping. She has intermittent LBP with intermittent bilateral posterior leg pain to her knees that is worse with bending, squatting, lifting. Her leg pain is much less frequent. She has numbness, tingling in her legs (has known diabetic neuropathy). No weakness.    She has possible trace retrolisthesis at L5-S1. Also with diffuse lumbar spondylosis and small right sided disc at L5-S1 that abuts the descending right S1 nerve. LBP is likely due to spondylosis. Right leg pain  may be from small disc at L5-S1, not sure of cause of left leg pain.     Treatment options discussed with patient and following plan made:    - Continue PT for lumbar spine at [facility]. She is only able to go every other week, but is doing HEP on her own.  - Blood sugars are under better control, but would still try to avoid injections at this point. May  need to revisit if she does not have continued improvement with PT.  - Continue to follow with neurology for DM neuropathy.  - Follow up with me in 6-8 weeks and prn.   Drake Leach PA-C Neurosurgery

## 2023-05-14 ENCOUNTER — Ambulatory Visit (INDEPENDENT_AMBULATORY_CARE_PROVIDER_SITE_OTHER): Payer: No Typology Code available for payment source | Admitting: Orthopedic Surgery

## 2023-05-14 ENCOUNTER — Encounter: Payer: Self-pay | Admitting: Orthopedic Surgery

## 2023-05-14 DIAGNOSIS — M4726 Other spondylosis with radiculopathy, lumbar region: Secondary | ICD-10-CM

## 2023-05-14 DIAGNOSIS — M5416 Radiculopathy, lumbar region: Secondary | ICD-10-CM

## 2023-05-14 DIAGNOSIS — M47816 Spondylosis without myelopathy or radiculopathy, lumbar region: Secondary | ICD-10-CM

## 2023-05-15 ENCOUNTER — Ambulatory Visit: Payer: No Typology Code available for payment source | Attending: Orthopedic Surgery | Admitting: Physical Therapy

## 2023-05-15 ENCOUNTER — Encounter: Payer: Self-pay | Admitting: Physical Therapy

## 2023-05-15 DIAGNOSIS — M5416 Radiculopathy, lumbar region: Secondary | ICD-10-CM | POA: Insufficient documentation

## 2023-05-15 DIAGNOSIS — R293 Abnormal posture: Secondary | ICD-10-CM | POA: Diagnosis present

## 2023-05-15 DIAGNOSIS — M6281 Muscle weakness (generalized): Secondary | ICD-10-CM | POA: Diagnosis present

## 2023-05-15 NOTE — Therapy (Signed)
OUTPATIENT PHYSICAL THERAPY TREATMENT NOTE   Patient Name: Deborah Pierce MRN: [id number] DOB:[birth year], 33 y.o., female Today's Date: 05/15/2023  END OF SESSION:  PT End of Session - 05/15/23 0815     Visit Number 3    Date for PT Re-Evaluation 06/10/23    Authorization Type [employer] primary; [employer] Secondary    PT Start Time [id number]    PT Stop Time [phone]    PT Time Calculation (min) 41 min    Activity Tolerance Patient tolerated treatment well    Behavior During Therapy WFL for tasks assessed/performed             Past Medical History:  Diagnosis Date   Anxiety    Depression    Diabetes mellitus without complication (HCC)    Dysplastic nevus 02/20/2022   Severe, R to mid upper back paraspinal, Excised 07/21/22   Dysplastic nevus 02/20/2022   Severe, L mid back paraspinal, Excised 08/18/22   Dysplastic nevus 02/20/2022   mild, L lateral buttocks   Fractured elbow 2022   rt   Headache    Other fatigue 08/12/2021   Pregnancy induced hypertension 05/11/2022   Pt states they just noticed about a week ago.   Seasonal allergies 08/07/2011   UTI (urinary tract infection)    Past Surgical History:  Procedure Laterality Date   CESAREAN SECTION N/A 05/29/2022   Procedure: CESAREAN SECTION;  Surgeon: Kathrynn Running, MD;  Location: [facility] LD ORS;  Service: Obstetrics;  Laterality: N/A;   LAPAROSCOPY     looking for endometriosis   Patient Active Problem List   Diagnosis Date Noted   Acute right-sided low back pain with right-sided sciatica 02/04/2023   Peripheral neuropathy 02/04/2023   Type 2 diabetes mellitus with diabetic neuropathy, with long-term current use of insulin (HCC) 02/04/2023   Retrolisthesis of vertebrae 02/04/2023   Glucosuria 12/30/2022   Class 1 obesity due to excess calories with serious comorbidity and body mass index (BMI) of 34.0 to 34.9 in adult 10/26/2022   Dysplastic nevus 04/16/2022   Mixed hyperlipidemia 12/17/2021   Vitamin D deficiency  12/17/2021   Controlled type 2 diabetes mellitus without complication, with long-term current use of insulin (HCC) 08/12/2021   Diabetic peripheral neuropathy (HCC) 08/12/2021   Depression with anxiety 08/12/2021    PCP: Mort Sawyers  REFERRING PROVIDER: Drake Leach  REFERRING DIAG: Lumbar radiculopathy  Rationale for Evaluation and Treatment: Rehabilitation  THERAPY DIAG:  Radiculopathy, lumbar region  Generalized muscle weakness  Abnormal posture  ONSET DATE: Patient reports initial onset of pain 2-3 months ago  SUBJECTIVE:  SUBJECTIVE STATEMENT: Pt reports that her back is feeling better overall.  Her pain fluctuates but overall is better.  EVAL: Patient reports insidious onset of pain. Patient states that she had been seeing a chiropractor for 2-3 times a week for the past several months. States she didn't have any issues with seeing the chiropractor then one day she started having bad back pain. States that her pain has steadily gotten worse over the past 2-3 months. MRI done on 03/14/2023 with no significant findings. Mild lumbar degeneration and retrolisthesis.   PERTINENT HISTORY:  Diabetes, HTN, diabetic neuropathy  PAIN:  Are you having pain? Yes: NPRS scale: 0-8 Pain location: Low back; right LE and some times left LE Pain description: sharp, stabbing pain; throbbing ache/dull pain in leg and at rest Aggravating factors: returning to standing after bending/stooping forward, walking more than 20-30 minutes, squatting, standing/doing dishes for 10-15 minutes Relieving factors: ice, heat, forward flexion, tylenol, aleve  PRECAUTIONS: None  RED FLAGS: Bowel or bladder incontinence: Yes: bladder since initial injury. Reports every few days having issues with incontinence    WEIGHT  BEARING RESTRICTIONS: No  FALLS:  Has patient fallen in last 6 months? 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 Return to work without pain  NEXT MD VISIT: 05/14/23 telehealth visit  OBJECTIVE:  Note: Objective measures were completed at Evaluation unless otherwise noted.  DIAGNOSTIC FINDINGS:  Patient with signs/symptoms consistent with lumbar radiculopathy and spondylolisthesis  PATIENT SURVEYS:  Modified Oswestry Will assess next session  05/01/23: 12/50 24%  COGNITION: Overall cognitive status: Within functional limits for tasks assessed     SENSATION: WFL   POSTURE: rounded shoulders, forward head, increased lumbar lordosis, and flexed trunk   PALPATION: TTP right lumbar paraspinals, right glute med. Hypomobility with pain right UPAs L4-S1  LUMBAR ROM:   AROM eval  Flexion WNL; pain with return to standing  Extension Limited but no increase in pain  Right lateral flexion WNL  Left lateral flexion WNL  Right rotation WNL  Left rotation WNL   (Blank rows = not tested)  LUMBAR SPECIAL TESTS:  Prone instability test: Positive and Slump test: Negative  FUNCTIONAL TESTS:  30 seconds chair stand test 6 reps with intermittent use of UE  GAIT: Distance walked: 100 feet Assistive device utilized: None Level of assistance: Complete Independence Comments: Slowed gait speed. Left sided weight shift. Increased lateral sway  TREATMENT DATE:  [facility] Adult PT Treatment:                                                DATE: 05/15/23 Therapeutic Exercise: Nustep level 4 x 5 mins while gathering subjective and planning session with patient LTR x2' Supine sciatic nerve glide x20  BIL Bil foot lift from HL - 4x3 Bridges on ball 2x10 - 2x10 Seated pball roll outs x15 ea fwd/lat S/L clam with RTB - 2x10 ea Hip adduction squeeze - 5'' - 2x10 Standing hip abd machine - 2x10 Updating HEP  Therapeutic Activity Sit to stand - 2x10 Step up - 10x fwd and lat  PATIENT EDUCATION:  Education details: Discussed objective findings with patient. Discussed POC including HEP, frequency, and anatomy/physiology of present condition Person educated: Patient Education method: Explanation, Demonstration, and Handouts Education comprehension: verbalized understanding, returned demonstration, and needs further education  HOME EXERCISE PROGRAM: Access Code: [email or web address] URL: [email or web address] Date: 05/15/2023 Prepared by: Alphonzo Severance  Exercises - Supine Posterior Pelvic Tilt  - 2 x daily - 7 x weekly - 2 sets - 10 reps - Supine Bridge  - 1 x daily - 7 x weekly - 3 sets - 10 reps - 2-3 seconds hold - Supine 90/90 Abdominal Bracing  - 1 x daily - 7 x weekly - 5 sets - 5 reps - Seated March  - 2 x daily - 7 x weekly - 3 sets - 10 reps - Standing Lumbar Extension with Counter  - 2 x daily - 7 x weekly - 3 sets - 10 reps  ASSESSMENT:  CLINICAL IMPRESSION: Esli tolerated session well with no adverse reaction.  Worked on core and hip strength with mat exercises and updated HEP accordingly.  Pt with some transient abdominal cramping supine 90-90 which improved with repetition.    EVAL: Patient is a 33 y.o. female who was seen today for physical therapy evaluation and treatment for lumbar radiculopathy and spondylolisthesis. Patient presents to therapy with moderate point tenderness of right lumbar paraspinals, QL, and glute med. Also shows hypomobility and point tenderness with right UPAs L4-S1. Patient also shows poor lifting and squatting  mechanics. Also demonstrates forward flexion preference with reproduction of pain with lifting and returning to standing position. Patient unable to perform work tasks and child care duties without restrictions. Patient requires continued skilled PT services to address deficits and return to prior level of function.   OBJECTIVE IMPAIRMENTS: decreased mobility, difficulty walking, decreased ROM, decreased strength, and pain.   ACTIVITY LIMITATIONS: carrying, lifting, bending, sitting, and squatting  PARTICIPATION LIMITATIONS: meal prep, cleaning, laundry, and occupation  PERSONAL FACTORS: 1-2 comorbidities: neuropathy and diabetes  are also affecting patient's functional outcome.   REHAB POTENTIAL: Good  CLINICAL DECISION MAKING: Stable/uncomplicated  EVALUATION COMPLEXITY: Low   GOALS: Goals reviewed with patient? No  SHORT TERM GOALS: Target date: 05/06/2023   Patient will be independent with HEP to improve carryover of sessions Baseline: See above Goal status: INITIAL  2.  Patient will be able to stand greater than 20 minutes without increase in pain to perform house cleaning chores Baseline: Pain with more than 10-15 minutes of standing Goal status: INITIAL  3.  Patient will be able to squat and lift at least 20lbs without pain to lift son out of crib Baseline: Pain with all squats.  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  Patient will be able to stand and do dishes greater than 1 hour for household chores and ADLs Baseline: Pain with 10-15 minutes of standing Goal status: INITIAL  3.  Patient will be able to walk greater than 45 minutes without pain to complete work tasks Baseline: Pain with more than 20 minutes of walking Goal  status: INITIAL  PLAN:  PT FREQUENCY: 1-2x/week  PT DURATION: 8 weeks  PLANNED INTERVENTIONS: 97164- PT Re-evaluation, 97110-Therapeutic exercises, 97530- Therapeutic activity, 97112- Neuromuscular re-education,  04540- Self Care, 98119- Manual therapy, [id number]- Gait training, [phone]- Aquatic Therapy, Balance training, Stair training, Taping, Dry Needling, Joint mobilization, Joint manipulation, Spinal manipulation, and Spinal mobilization.  PLAN FOR NEXT SESSION: Continue PT services   Fredderick Phenix, PT 05/15/2023, 8:56 AM

## 2023-05-19 ENCOUNTER — Other Ambulatory Visit: Payer: Self-pay | Admitting: Family

## 2023-05-20 ENCOUNTER — Ambulatory Visit: Payer: No Typology Code available for payment source

## 2023-05-26 ENCOUNTER — Ambulatory Visit: Payer: No Typology Code available for payment source | Admitting: Physical Therapy

## 2023-05-26 ENCOUNTER — Encounter: Payer: Self-pay | Admitting: Physical Therapy

## 2023-05-26 DIAGNOSIS — M6281 Muscle weakness (generalized): Secondary | ICD-10-CM

## 2023-05-26 DIAGNOSIS — R293 Abnormal posture: Secondary | ICD-10-CM

## 2023-05-26 DIAGNOSIS — M5416 Radiculopathy, lumbar region: Secondary | ICD-10-CM

## 2023-05-26 NOTE — Therapy (Signed)
PHYSICAL THERAPY DISCHARGE SUMMARY  Visits from Start of Care: 4  Current functional level related to goals / functional outcomes: See assessment/goals   Remaining deficits: See assessment/goals   Education / Equipment: HEP and D/C plans  Patient agrees to discharge. Patient goals were met. Patient is being discharged due to being pleased with the current functional level.   Patient Name: Deborah Pierce MRN: [id number] DOB:Feb 24, 1991, 33 y.o., female Today's Date: 05/26/2023  END OF SESSION:  PT End of Session - 05/26/23 1401     Visit Number 4    Date for PT Re-Evaluation 06/10/23    Authorization Type [employer] primary; [employer] Secondary    PT Start Time 1400    PT Stop Time 1441    PT Time Calculation (min) 41 min    Activity Tolerance Patient tolerated treatment well    Behavior During Therapy WFL for tasks assessed/performed             Past Medical History:  Diagnosis Date   Anxiety    Depression    Diabetes mellitus without complication (HCC)    Dysplastic nevus 02/20/2022   Severe, R to mid upper back paraspinal, Excised 07/21/22   Dysplastic nevus 02/20/2022   Severe, L mid back paraspinal, Excised 08/18/22   Dysplastic nevus 02/20/2022   mild, L lateral buttocks   Fractured elbow 2022   rt   Headache    Other fatigue 08/12/2021   Pregnancy induced hypertension 05/11/2022   Pt states they just noticed about a week ago.   Seasonal allergies 08/07/2011   UTI (urinary tract infection)    Past Surgical History:  Procedure Laterality Date   CESAREAN SECTION N/A 05/29/2022   Procedure: CESAREAN SECTION;  Surgeon: Kathrynn Running, MD;  Location: [facility] LD ORS;  Service: Obstetrics;  Laterality: N/A;   LAPAROSCOPY     looking for endometriosis   Patient Active Problem List   Diagnosis Date Noted   Acute right-sided low back pain with right-sided sciatica 02/04/2023   Peripheral neuropathy 02/04/2023   Type 2 diabetes mellitus with diabetic  neuropathy, with long-term current use of insulin (HCC) 02/04/2023   Retrolisthesis of vertebrae 02/04/2023   Glucosuria 12/30/2022   Class 1 obesity due to excess calories with serious comorbidity and body mass index (BMI) of 34.0 to 34.9 in adult 10/26/2022   Dysplastic nevus 04/16/2022   Mixed hyperlipidemia 12/17/2021   Vitamin D deficiency 12/17/2021   Controlled type 2 diabetes mellitus without complication, with long-term current use of insulin (HCC) 08/12/2021   Diabetic peripheral neuropathy (HCC) 08/12/2021   Depression with anxiety 08/12/2021    PCP: Mort Sawyers  REFERRING PROVIDER: Drake Leach  REFERRING DIAG: Lumbar radiculopathy  Rationale for Evaluation and Treatment: Rehabilitation  THERAPY DIAG:  Radiculopathy, lumbar region  Generalized muscle weakness  Abnormal posture  ONSET DATE: Patient reports initial onset of pain 2-3 months ago  SUBJECTIVE:  SUBJECTIVE STATEMENT: Pt reports that she continues to make progress with her exercises.  EVAL: Patient reports insidious onset of pain. Patient states that she had been seeing a chiropractor for 2-3 times a week for the past several months. States she didn't have any issues with seeing the chiropractor then one day she started having bad back pain. States that her pain has steadily gotten worse over the past 2-3 months. MRI done on 03/14/2023 with no significant findings. Mild lumbar degeneration and retrolisthesis.   PERTINENT HISTORY:  Diabetes, HTN, diabetic neuropathy  PAIN:  Are you having pain? Yes: NPRS scale: 0-8 Pain location: Low back; right LE and some times left LE Pain description: sharp, stabbing pain; throbbing ache/dull pain in leg and at rest Aggravating factors: returning to standing after bending/stooping  forward, walking more than 20-30 minutes, squatting, standing/doing dishes for 10-15 minutes Relieving factors: ice, heat, forward flexion, tylenol, aleve  PRECAUTIONS: None  RED FLAGS: Bowel or bladder incontinence: Yes: bladder since initial injury. Reports every few days having issues with incontinence    WEIGHT BEARING RESTRICTIONS: No  FALLS:  Has patient fallen in last 6 months? 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 Return to work without pain  NEXT MD VISIT: 05/14/23 telehealth visit  OBJECTIVE:  Note: Objective measures were completed at Evaluation unless otherwise noted.  DIAGNOSTIC FINDINGS:  Patient with signs/symptoms consistent with lumbar radiculopathy and spondylolisthesis  PATIENT SURVEYS:  Modified Oswestry Will assess next session  05/01/23: 12/50 24%  COGNITION: Overall cognitive status: Within functional limits for tasks assessed     SENSATION: WFL   POSTURE: rounded shoulders, forward head, increased lumbar lordosis, and flexed trunk   PALPATION: TTP right lumbar paraspinals, right glute med. Hypomobility with pain right UPAs L4-S1  LUMBAR ROM:   AROM eval  Flexion WNL; pain with return to standing  Extension Limited but no increase in pain  Right lateral flexion WNL  Left lateral flexion WNL  Right rotation WNL  Left rotation WNL   (Blank rows = not tested)  LUMBAR SPECIAL TESTS:  Prone instability test: Positive and Slump test: Negative  FUNCTIONAL TESTS:  30 seconds chair stand test 6 reps with intermittent use of UE  GAIT: Distance walked: 100 feet Assistive device utilized: None Level of assistance: Complete  Independence Comments: Slowed gait speed. Left sided weight shift. Increased lateral sway  TREATMENT DATE:  [facility] Adult PT Treatment:                                                DATE: 05/26/2023 Therapeutic Exercise: Nustep level 4 x 5 mins while gathering subjective and planning session with patient LTR x2' Supine sciatic nerve glide x20 BIL Bil foot lift from HL - 4x5 S/L clam with GTB - 2x10 ea Hip adduction squeeze - 5'' - 2x10  Therapeutic Activity Sit to stand - 3x10 Step up - 10x fwd and lat Bridges on ball 2x10 Standing hip abd machine - 2x10 - 20# collecting information for goals, checking progress, and reviewing with patient  HOME EXERCISE PROGRAM: Access Code: [email or web address] URL: [email or web address] Date: 05/15/2023 Prepared by: Alphonzo Severance  Exercises - Supine Posterior Pelvic Tilt  - 2 x daily - 7 x weekly - 2 sets - 10 reps - Supine Bridge  - 1 x daily - 7 x weekly - 3 sets - 10 reps - 2-3 seconds hold - Supine 90/90 Abdominal Bracing  - 1 x daily - 7 x weekly - 5 sets - 5 reps - Seated March  - 2 x daily - 7 x weekly - 3 sets - 10 reps - Standing Lumbar Extension with Counter  - 2 x daily - 7 x weekly - 3 sets - 10 reps  ASSESSMENT:  CLINICAL IMPRESSION: Yolonda tolerated session well with no adverse reaction.  We continued working on core and hip strengthening today with some compound functional movements.  Upon goal re-check today Maika has met all goals.  She feels confident in completing daily tasks including lifting her son.  She has not yet returned to work, but feels confident she could.  We will D/C home with HEP.   EVAL: Patient is a 33 y.o. female who was seen today for physical therapy evaluation and treatment for lumbar radiculopathy and spondylolisthesis. Patient presents to therapy with moderate point tenderness  of right lumbar paraspinals, QL, and glute med. Also shows hypomobility and point tenderness with right UPAs L4-S1. Patient also shows poor lifting and squatting mechanics. Also demonstrates forward flexion preference with reproduction of pain with lifting and returning to standing position. Patient unable to perform work tasks and child care duties without restrictions. Patient requires continued skilled PT services to address deficits and return to prior level of function.   OBJECTIVE IMPAIRMENTS: decreased mobility, difficulty walking, decreased ROM, decreased strength, and pain.   ACTIVITY LIMITATIONS: carrying, lifting, bending, sitting, and squatting  PARTICIPATION LIMITATIONS: meal prep, cleaning, laundry, and occupation  PERSONAL FACTORS: 1-2 comorbidities: neuropathy and diabetes  are also affecting patient's functional outcome.   REHAB POTENTIAL: Good  CLINICAL DECISION MAKING: Stable/uncomplicated  EVALUATION COMPLEXITY: Low   GOALS: Goals reviewed with patient? No  SHORT TERM GOALS: Target date: 05/06/2023   Patient will be independent with HEP to improve carryover of sessions Baseline: See above Goal status: MET  2.  Patient will be able to stand greater than 20 minutes without increase in pain to perform house cleaning chores Baseline: Pain with more than 10-15 minutes of standing Goal status: MET  3.  Patient will be able to squat and lift at least 20lbs without pain to lift son out of crib Baseline: Pain with all squats.  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  Patient will be able to stand and do dishes greater than 1 hour for household chores and ADLs Baseline: Pain with 10-15 minutes of standing 2/12: able Goal  status: MET  3.  Patient will be able to walk greater than 45 minutes without pain to complete work tasks Baseline: Pain with more than 20 minutes of walking 2/12:  Goal status: MET  PLAN:  PT FREQUENCY: 1-2x/week  PT DURATION: 8 weeks  PLANNED INTERVENTIONS: 97164- PT Re-evaluation, 97110-Therapeutic exercises, 97530- Therapeutic activity, 97112- Neuromuscular re-education, 97535- Self Care, [id number]- Manual  therapy, [id number]- Gait training, [id number]- Aquatic Therapy, Balance training, Stair training, Taping, Dry Needling, Joint mobilization, Joint manipulation, Spinal manipulation, and Spinal mobilization.  PLAN FOR NEXT SESSION: Continue PT services   Fredderick Phenix, PT 05/26/2023, 2:41 PM

## 2023-06-12 ENCOUNTER — Other Ambulatory Visit: Payer: Self-pay | Admitting: Obstetrics & Gynecology

## 2023-06-12 DIAGNOSIS — Z30011 Encounter for initial prescription of contraceptive pills: Secondary | ICD-10-CM

## 2023-07-11 ENCOUNTER — Other Ambulatory Visit: Payer: Self-pay | Admitting: Family

## 2023-07-11 DIAGNOSIS — F418 Other specified anxiety disorders: Secondary | ICD-10-CM

## 2023-07-11 DIAGNOSIS — E781 Pure hyperglyceridemia: Secondary | ICD-10-CM

## 2023-07-12 NOTE — Progress Notes (Unsigned)
 Referring Physician:  Mort Sawyers, FNP [address] [place],  [place] [address]  Primary Physician:  Mort Sawyers, FNP  History of Present Illness: [name] has a history of DM with neuropathy, hyperlipidemia, and obesity.   Last seen by  me for phone visit on 05/14/23 for LBP and right leg pain. She has known diabetic neuropathy.   She has possible trace retrolisthesis at L5-S1. Also with diffuse lumbar spondylosis and small right sided disc at L5-S1 that abuts the descending right S1 nerve. LBP is likely due to spondylosis. Right leg pain may be from small disc at L5-S1, not sure of cause of left leg pain.   She was to continue with PT at her last visit and continue to follow with neurology for her DM neuropathy. Discussed injections but wanted to hold off due to elevated blood sugars.   She is here for follow up.   She was discharged from PT on 05/26/23- she had great improvement with PT. She only has occasional LBP with prolonged standing. No leg pain. No weakness. She has known DM neuropathy and has seen improvement with lyrica.   Blood sugars have been high. She states recent HgbA1c was 11. Not in her chart. Still seeing neurology for her neuropathy.     Bowel/Bladder Dysfunction: none, she has urinary urgency. No bowel issues. No perineal numbness.    Conservative measures:  Physical therapy: did 4 visits of PT at [facility] and was discharged on 05/26/23 Multimodal medical therapy including regular antiinflammatories: tylenol, motrin, neurotin, zanaflex, lidocaine patches  Injections: has not received epidural steroid injections   Past Surgery: no previous spinal surgeries  Review of Systems:  A 10 point review of systems is negative, except for the pertinent positives and negatives detailed in the HPI.  Past Medical History: Past Medical History:  Diagnosis Date   Anxiety    Depression    Diabetes mellitus without complication (HCC)    Dysplastic  nevus 02/20/2022   Severe, R to mid upper back paraspinal, Excised 07/21/22   Dysplastic nevus 02/20/2022   Severe, L mid back paraspinal, Excised 08/18/22   Dysplastic nevus 02/20/2022   mild, L lateral buttocks   Fractured elbow 2022   rt   Headache    Other fatigue 08/12/2021   Pregnancy induced hypertension 05/11/2022   Pt states they just noticed about a week ago.   Seasonal allergies 08/07/2011   UTI (urinary tract infection)     Past Surgical History: Past Surgical History:  Procedure Laterality Date   CESAREAN SECTION N/A 05/29/2022   Procedure: CESAREAN SECTION;  Surgeon: Kathrynn Running, MD;  Location: [facility] LD ORS;  Service: Obstetrics;  Laterality: N/A;   LAPAROSCOPY     looking for endometriosis    Allergies: Allergies as of 07/13/2023 - Review Complete 05/26/2023  Allergen Reaction Noted   Fruit extracts Itching and Other (See Comments) 05/28/2022   Insulins Nausea Only and Rash 04/20/2020   Sertraline Other (See Comments) 10/26/2022   Tree extract Itching 11/19/2021   Bee venom Rash 04/20/2020   Insulin degludec-liraglutide Rash 11/19/2021   Insulin glargine-lixisenatide Rash 11/19/2021   Penicillins Rash 04/20/2020   Sulfa antibiotics Rash 04/20/2020    Medications: Outpatient Encounter Medications as of 07/13/2023  Medication Sig   busPIRone (BUSPAR) 5 MG tablet TAKE 1 TABLET BY MOUTH TWICE A DAY   cetirizine (ZYRTEC ALLERGY) 10 MG tablet 1 tablet as needed Orally Once a day   Continuous Blood  Gluc Sensor (DEXCOM G6 SENSOR) MISC Apply topically as directed.   Continuous Blood Gluc Transmit (DEXCOM G6 TRANSMITTER) MISC USE 1 TRANSMITTER TO MONITOR BLOOD SUGARS EVERY 3 MONTHS 90 DAYS   FLUoxetine (PROZAC) 40 MG capsule Take 1 capsule (40 mg total) by mouth daily.   gabapentin (NEURONTIN) 300 MG capsule Take one tablet in am (300 mg) and two tablets pm (600 mg)   Insulin Aspart FlexPen (NOVOLOG) 100 UNIT/ML Sliding scale insulin Less than 70 initiate  hypoglycemia protocol 70-120  0 units 120-150 2 units 151-200 3 units 201-250 5units 251-300 8 units 301-350 11 units 351-400 15 units Greater than 400 call MD   Insulin Disposable Pump (OMNIPOD 5 G6 PODS, GEN 5,) MISC SMARTSIG:SUB-Q Every Other Day   metFORMIN (GLUCOPHAGE) 1000 MG tablet TAKE 1 TABLET (1,000 MG TOTAL) BY MOUTH TWICE A DAY WITH FOOD   NIFEdipine (PROCARDIA-XL/NIFEDICAL-XL) 30 MG 24 hr tablet TAKE 1 TABLET BY MOUTH EVERY DAY   norethindrone (MICRONOR) 0.35 MG tablet TAKE 1 TABLET BY MOUTH EVERY DAY   NOVOLOG 100 UNIT/ML injection use up to 100 units daily subcutaneously daily in the pump for 30 days Ok to sub Humalog if insurance prefers   omega-3 acid ethyl esters (LOVAZA) 1 g capsule Take 2 capsules (2 g total) by mouth 2 (two) times daily.   pravastatin (PRAVACHOL) 10 MG tablet Take 1 tablet (10 mg total) by mouth daily.   tiZANidine (ZANAFLEX) 4 MG tablet TAKE 1/2 TO ONE TABLET BY MOUTH AT BEDTIME AS NEEDED MUSCLE SPASM   TRESIBA FLEXTOUCH 200 UNIT/ML FlexTouch Pen START WITH 40 UNITS DAILY AND GRADUALLY TITRATE UP AS DIRECTED TO 70 UNITS DAILY.   No facility-administered encounter medications on file as of 07/13/2023.    Social History: Social History   Tobacco Use   Smoking status: Never   Smokeless tobacco: Never  Vaping Use   Vaping status: Never Used  Substance Use Topics   Alcohol use: Not Currently   Drug use: Not Currently    Types: Marijuana    Family Medical History: Family History  Problem Relation Age of Onset   Hypertension Mother    Heart disease Mother    Skin cancer Mother        melanoma   Diabetes Mother    Diabetes type II Mother    Melanoma Mother    Diabetes Sister    Heart disease Maternal Grandmother    Hypertension Maternal Grandmother    Diabetes Maternal Grandmother    Lung cancer Maternal Grandfather    Brain cancer Maternal Grandfather    Asthma Neg Hx     Physical Examination: There were no vitals filed for this  visit.  Awake, alert, oriented to person, place, and time.  Speech is clear and fluent. Fund of knowledge is appropriate.   Cranial Nerves: Pupils equal round and reactive to light.  Facial tone is symmetric.    No abnormal lesions on exposed skin.   Strength:  Side Iliopsoas Quads Hamstring PF DF EHL  R 5 5 5 5 5 5   L 5 5 5 5 5 5    Bilateral lower extremity sensation is intact to light touch.  Gait is normal.    Medical Decision Making  Imaging: none   Assessment and Plan: She is doing great after PT! She has occasional LBP with prolonged standing. No leg pain. She has known DM neuropathy.    She has possible trace retrolisthesis at L5-S1. Also with diffuse lumbar spondylosis and small  right sided disc at L5-S1 that abuts the descending right S1 nerve.    Treatment options discussed with patient and following plan made:   - Continue HEP from PT.  - Continue to work on optimizing her blood surgars.  - Continue to follow with neurology for DM neuropathy.  - Follow up with me prn.  I spent a total of 15 minutes in face-to-face and non-face-to-face activities related to this patient's care today including review of outside records, review of imaging, review of symptoms, physical exam, discussion of differential diagnosis, discussion of treatment options, and documentation.   Drake Leach PA-C Dept. of Neurosurgery

## 2023-07-13 ENCOUNTER — Encounter: Payer: Self-pay | Admitting: Orthopedic Surgery

## 2023-07-13 ENCOUNTER — Ambulatory Visit (INDEPENDENT_AMBULATORY_CARE_PROVIDER_SITE_OTHER): Payer: No Typology Code available for payment source | Admitting: Orthopedic Surgery

## 2023-07-13 VITALS — BP 122/82 | Ht 67.0 in | Wt 262.0 lb

## 2023-07-13 DIAGNOSIS — M5127 Other intervertebral disc displacement, lumbosacral region: Secondary | ICD-10-CM | POA: Diagnosis not present

## 2023-07-13 DIAGNOSIS — M47816 Spondylosis without myelopathy or radiculopathy, lumbar region: Secondary | ICD-10-CM | POA: Diagnosis not present

## 2023-08-17 ENCOUNTER — Other Ambulatory Visit: Payer: Self-pay | Admitting: Family

## 2023-08-17 DIAGNOSIS — E781 Pure hyperglyceridemia: Secondary | ICD-10-CM

## 2023-09-11 ENCOUNTER — Other Ambulatory Visit: Payer: Self-pay | Admitting: Family

## 2023-09-11 DIAGNOSIS — M5441 Lumbago with sciatica, right side: Secondary | ICD-10-CM

## 2023-10-04 ENCOUNTER — Other Ambulatory Visit: Payer: Self-pay | Admitting: Family

## 2023-10-04 DIAGNOSIS — E781 Pure hyperglyceridemia: Secondary | ICD-10-CM

## 2023-10-21 DIAGNOSIS — E1165 Type 2 diabetes mellitus with hyperglycemia: Secondary | ICD-10-CM | POA: Diagnosis not present

## 2023-10-21 DIAGNOSIS — E559 Vitamin D deficiency, unspecified: Secondary | ICD-10-CM | POA: Diagnosis not present

## 2023-10-21 DIAGNOSIS — E782 Mixed hyperlipidemia: Secondary | ICD-10-CM | POA: Diagnosis not present

## 2023-10-21 DIAGNOSIS — R809 Proteinuria, unspecified: Secondary | ICD-10-CM | POA: Diagnosis not present

## 2023-10-21 DIAGNOSIS — E1140 Type 2 diabetes mellitus with diabetic neuropathy, unspecified: Secondary | ICD-10-CM | POA: Diagnosis not present

## 2023-10-21 LAB — HEMOGLOBIN A1C: Hemoglobin A1C: 7.6

## 2023-11-08 ENCOUNTER — Other Ambulatory Visit: Payer: Self-pay | Admitting: Family

## 2023-11-08 DIAGNOSIS — E781 Pure hyperglyceridemia: Secondary | ICD-10-CM

## 2023-11-12 ENCOUNTER — Ambulatory Visit: Admitting: Family

## 2023-11-12 ENCOUNTER — Encounter: Payer: Self-pay | Admitting: Family

## 2023-11-12 VITALS — BP 112/72 | HR 78 | Temp 98.0°F | Ht 67.5 in | Wt 245.0 lb

## 2023-11-12 DIAGNOSIS — Z0000 Encounter for general adult medical examination without abnormal findings: Secondary | ICD-10-CM | POA: Diagnosis not present

## 2023-11-12 DIAGNOSIS — D72829 Elevated white blood cell count, unspecified: Secondary | ICD-10-CM | POA: Diagnosis not present

## 2023-11-12 DIAGNOSIS — F418 Other specified anxiety disorders: Secondary | ICD-10-CM | POA: Diagnosis not present

## 2023-11-12 DIAGNOSIS — Z794 Long term (current) use of insulin: Secondary | ICD-10-CM | POA: Diagnosis not present

## 2023-11-12 DIAGNOSIS — E1140 Type 2 diabetes mellitus with diabetic neuropathy, unspecified: Secondary | ICD-10-CM

## 2023-11-12 DIAGNOSIS — E782 Mixed hyperlipidemia: Secondary | ICD-10-CM | POA: Diagnosis not present

## 2023-11-12 DIAGNOSIS — E119 Type 2 diabetes mellitus without complications: Secondary | ICD-10-CM | POA: Diagnosis not present

## 2023-11-12 DIAGNOSIS — E559 Vitamin D deficiency, unspecified: Secondary | ICD-10-CM

## 2023-11-12 LAB — COMPREHENSIVE METABOLIC PANEL WITH GFR
ALT: 29 U/L (ref 0–35)
AST: 21 U/L (ref 0–37)
Albumin: 4.6 g/dL (ref 3.5–5.2)
Alkaline Phosphatase: 67 U/L (ref 39–117)
BUN: 12 mg/dL (ref 6–23)
CO2: 28 meq/L (ref 19–32)
Calcium: 9 mg/dL (ref 8.4–10.5)
Chloride: 102 meq/L (ref 96–112)
Creatinine, Ser: 0.71 mg/dL (ref 0.40–1.20)
GFR: 112.35 mL/min (ref >60.00)
Glucose, Bld: 120 mg/dL — ABNORMAL HIGH (ref 70–99)
Potassium: 4.5 meq/L (ref 3.5–5.1)
Sodium: 139 meq/L (ref 135–145)
Total Bilirubin: 0.7 mg/dL (ref 0.2–1.2)
Total Protein: 7.3 g/dL (ref 6.0–8.3)

## 2023-11-12 LAB — CBC WITH DIFFERENTIAL/PLATELET
Basophils Absolute: 0 K/uL (ref 0.0–0.1)
Basophils Relative: 0.4 % (ref 0.0–3.0)
Eosinophils Absolute: 0.3 K/uL (ref 0.0–0.7)
Eosinophils Relative: 3.2 % (ref 0.0–5.0)
HCT: 43.2 % (ref 36.0–46.0)
Hemoglobin: 14.3 g/dL (ref 12.0–15.0)
Lymphocytes Relative: 27.7 % (ref 12.0–46.0)
Lymphs Abs: 2.6 K/uL (ref 0.7–4.0)
MCHC: 33.2 g/dL (ref 30.0–36.0)
MCV: 85.1 fl (ref 78.0–100.0)
Monocytes Absolute: 0.5 K/uL (ref 0.1–1.0)
Monocytes Relative: 5.4 % (ref 3.0–12.0)
Neutro Abs: 5.8 K/uL (ref 1.4–7.7)
Neutrophils Relative %: 63.3 % (ref 43.0–77.0)
Platelets: 466 K/uL — ABNORMAL HIGH (ref 150.0–400.0)
RBC: 5.08 Mil/uL (ref 3.87–5.11)
RDW: 14.5 % (ref 11.5–15.5)
WBC: 9.2 K/uL (ref 4.0–10.5)

## 2023-11-12 LAB — LIPID PANEL
Cholesterol: 127 mg/dL (ref 0–200)
HDL: 32.7 mg/dL — ABNORMAL LOW (ref >39.00)
LDL Cholesterol: 61 mg/dL (ref 0–99)
NonHDL: 94.54
Total CHOL/HDL Ratio: 4
Triglycerides: 170 mg/dL — ABNORMAL HIGH (ref 0.0–149.0)
VLDL: 34 mg/dL (ref 0.0–40.0)

## 2023-11-12 LAB — MICROALBUMIN / CREATININE URINE RATIO
Creatinine,U: 174 mg/dL
Microalb Creat Ratio: 12.9 mg/g (ref 0.0–30.0)
Microalb, Ur: 2.2 mg/dL — ABNORMAL HIGH (ref 0.0–1.9)

## 2023-11-12 LAB — VITAMIN D 25 HYDROXY (VIT D DEFICIENCY, FRACTURES): VITD: 52.54 ng/mL (ref 30.00–100.00)

## 2023-11-12 LAB — TSH: TSH: 1.35 u[IU]/mL (ref 0.35–5.50)

## 2023-11-12 MED ORDER — BUSPIRONE HCL 10 MG PO TABS
10.0000 mg | ORAL_TABLET | Freq: Two times a day (BID) | ORAL | Status: DC
Start: 2023-11-12 — End: 2023-11-22

## 2023-11-12 NOTE — Assessment & Plan Note (Signed)
 Ordered lipid panel, pending results. Work on low cholesterol diet and exercise as tolerated Continue lovasa

## 2023-11-12 NOTE — Assessment & Plan Note (Signed)
 Ordered vitamin d pending results.

## 2023-11-12 NOTE — Progress Notes (Signed)
 Subjective:  Patient ID: Charlott Meiers, female    DOB: 1990-04-14  Age: 33 y.o. MRN: [id number]  Patient Care Team: Corwin Antu, FNP as PCP - General (Family Medicine) Fredirick Glenys RAMAN, MD as PCP - OBGYN (Obstetrics and Gynecology)   CC:  Chief Complaint  Patient presents with   Annual Exam    Sees endocrinologist, GYN, and neurologist.     HPI Makalia Bare is a 33 y.o. female who presents today for an annual physical exam. She reports consuming a general diet. The patient does not participate in regular exercise at present. She generally feels well. She reports sleeping fairly well. She does not have additional problems to discuss today.   Vision:Within last year Dental:No regular dental care    Last pap:  Gynecology Dr. Fredirick   Pt is without acute concerns.   DM2 following endo, A1C 7.6 July 10th, taking metformin  1000 mg twice daily, trulicity 0.75 mg weekly , novolog  100 unit/ml using daily w pump   Anxiety, buspirone  5 mg twice daily. Situational anxiety,   HTN: doing well on nifedipine    HLD: placed on lovasa last visit, will repeat today. Lab Results  Component Value Date   CHOL 117 03/05/2023   HDL 32.80 (L) 03/05/2023   LDLDIRECT 49.0 03/05/2023   TRIG (H) 03/05/2023    425.0 Triglyceride is over 400; calculations on Lipids are invalid.   CHOLHDL 4 03/05/2023     Advanced Directives Patient does not have advanced directives    DEPRESSION SCREENING    11/12/2023   10:21 AM 12/30/2022    8:06 AM 10/26/2022   10:56 AM 12/17/2021    3:13 PM 08/12/2021   11:33 AM  PHQ 2/9 Scores  PHQ - 2 Score 4 0 2 0 2  PHQ- 9 Score 14 0 8 4 8      ROS: Negative unless specifically indicated above in HPI.    Current Outpatient Medications:    busPIRone  (BUSPAR ) 10 MG tablet, Take 1 tablet (10 mg total) by mouth 2 (two) times daily., Disp: , Rfl:    cetirizine (ZYRTEC ALLERGY) 10 MG tablet, 1 tablet as needed Orally Once a day, Disp: , Rfl:    Continuous Blood Gluc  Sensor (DEXCOM G6 SENSOR) MISC, Apply topically as directed., Disp: , Rfl:    Continuous Blood Gluc Transmit (DEXCOM G6 TRANSMITTER) MISC, USE 1 TRANSMITTER TO MONITOR BLOOD SUGARS EVERY 3 MONTHS 90 DAYS, Disp: , Rfl:    FLUoxetine  (PROZAC ) 40 MG capsule, Take 1 capsule (40 mg total) by mouth daily., Disp: 90 capsule, Rfl: 3   gabapentin  (NEURONTIN ) 300 MG capsule, Take one tablet in am (300 mg) and two tablets pm (600 mg), Disp: 90 capsule, Rfl: 0   Insulin  Disposable Pump (OMNIPOD 5 G6 PODS, GEN 5,) MISC, SMARTSIG:SUB-Q Every Other Day, Disp: , Rfl:    metFORMIN  (GLUCOPHAGE ) 1000 MG tablet, TAKE 1 TABLET (1,000 MG TOTAL) BY MOUTH TWICE A DAY WITH FOOD, Disp: 180 tablet, Rfl: 1   NIFEdipine  (PROCARDIA -XL/NIFEDICAL-XL) 30 MG 24 hr tablet, TAKE 1 TABLET BY MOUTH EVERY DAY, Disp: 90 tablet, Rfl: 3   norethindrone  (MICRONOR ) 0.35 MG tablet, TAKE 1 TABLET BY MOUTH EVERY DAY, Disp: 84 tablet, Rfl: 3   NOVOLOG  100 UNIT/ML injection, use up to 100 units daily subcutaneously daily in the pump for 30 days Ok to sub Humalog if insurance prefers, Disp: , Rfl:    omega-3 acid ethyl esters (LOVAZA ) 1 g capsule, TAKE 2 CAPSULES BY MOUTH 2 TIMES  DAILY., Disp: 120 capsule, Rfl: 0   pravastatin  (PRAVACHOL ) 10 MG tablet, Take 1 tablet (10 mg total) by mouth daily., Disp: 90 tablet, Rfl: 3   pregabalin (LYRICA) 75 MG capsule, Take 75 mg by mouth., Disp: , Rfl:    tiZANidine  (ZANAFLEX ) 4 MG tablet, TAKE 1/2 TO ONE TABLET BY MOUTH AT BEDTIME AS NEEDED MUSCLE SPASM, Disp: 90 tablet, Rfl: 1   TRULICITY 0.75 MG/0.5ML SOAJ, Inject 0.75 mg into the skin once a week., Disp: , Rfl:     Objective:    BP 112/72 (BP Location: Left Arm, Patient Position: Sitting, Cuff Size: Large)   Pulse 78   Temp 98 F (36.7 C) (Oral)   Ht 5' 7.5 (1.715 m)   Wt 245 lb (111.1 kg)   LMP 10/24/2023 (Exact Date)   SpO2 96%   BMI 37.81 kg/m   BP Readings from Last 3 Encounters:  11/12/23 112/72  07/13/23 122/82  03/26/23 139/89       Physical Exam Vitals reviewed.  Constitutional:      General: She is not in acute distress.    Appearance: Normal appearance. She is obese. She is not ill-appearing.  HENT:     Head: Normocephalic.     Right Ear: Tympanic membrane normal.     Left Ear: Tympanic membrane normal.     Nose: Nose normal.     Mouth/Throat:     Mouth: Mucous membranes are moist.  Eyes:     Extraocular Movements: Extraocular movements intact.     Pupils: Pupils are equal, round, and reactive to light.  Cardiovascular:     Rate and Rhythm: Normal rate and regular rhythm.  Pulmonary:     Effort: Pulmonary effort is normal.     Breath sounds: Normal breath sounds.  Abdominal:     General: Abdomen is flat. Bowel sounds are normal.     Palpations: Abdomen is soft.     Tenderness: There is no guarding or rebound.  Musculoskeletal:        General: Normal range of motion.     Cervical back: Normal range of motion.  Skin:    General: Skin is warm.     Capillary Refill: Capillary refill takes less than 2 seconds.  Neurological:     General: No focal deficit present.     Mental Status: She is alert.  Psychiatric:        Mood and Affect: Mood normal.        Behavior: Behavior normal.        Thought Content: Thought content normal.        Judgment: Judgment normal.          Assessment & Plan:  Depression with anxiety Assessment & Plan: Referral placed for therapy  Increase buspirone  10 mg twice daily    Orders: -     Ambulatory referral to Psychology -     busPIRone  HCl; Take 1 tablet (10 mg total) by mouth 2 (two) times daily.  Leukocytosis, unspecified type -     CBC with Differential/Platelet  Type 2 diabetes mellitus with diabetic neuropathy, with long-term current use of insulin  (HCC) -     Comprehensive metabolic panel with GFR -     Microalbumin / creatinine urine ratio  Mixed hyperlipidemia Assessment & Plan: Ordered lipid panel, pending results. Work on low cholesterol diet  and exercise as tolerated Continue lovasa   Orders: -     Lipid panel  Vitamin D  deficiency Assessment & Plan:  Ordered vitamin d  pending results.    Orders: -     VITAMIN D  25 Hydroxy (Vit-D Deficiency, Fractures)  Encounter for general adult medical examination without abnormal findings -     Lipid panel -     Comprehensive metabolic panel with GFR -     VITAMIN D  25 Hydroxy (Vit-D Deficiency, Fractures) -     CBC with Differential/Platelet -     TSH  Controlled type 2 diabetes mellitus without complication, with long-term current use of insulin  (HCC) Assessment & Plan: F/u with endo as scheduled.  Improved last visit.  Urine m/a        Follow-up: Return in about 6 months (around 05/14/2024) for f/u cholesterol.   Ginger Patrick, FNP

## 2023-11-12 NOTE — Patient Instructions (Signed)
  Increase buspirone  to 10 mg in am and 5 mg at night  After 3 days if doing well increase to 10 mg twice daily.

## 2023-11-12 NOTE — Assessment & Plan Note (Signed)
 Referral placed for therapy  Increase buspirone  10 mg twice daily

## 2023-11-12 NOTE — Assessment & Plan Note (Signed)
 F/u with endo as scheduled.  Improved last visit.  Urine m/a

## 2023-11-15 ENCOUNTER — Ambulatory Visit: Payer: Self-pay | Admitting: Family

## 2023-11-15 DIAGNOSIS — R809 Proteinuria, unspecified: Secondary | ICD-10-CM | POA: Insufficient documentation

## 2023-11-15 DIAGNOSIS — E1140 Type 2 diabetes mellitus with diabetic neuropathy, unspecified: Secondary | ICD-10-CM

## 2023-11-15 MED ORDER — LOSARTAN POTASSIUM 25 MG PO TABS: ORAL_TABLET | ORAL | 0 refills | Status: DC

## 2023-11-18 ENCOUNTER — Other Ambulatory Visit: Payer: Self-pay | Admitting: Family

## 2023-11-18 DIAGNOSIS — F418 Other specified anxiety disorders: Secondary | ICD-10-CM

## 2023-11-21 ENCOUNTER — Encounter: Payer: Self-pay | Admitting: Family

## 2023-11-21 DIAGNOSIS — F418 Other specified anxiety disorders: Secondary | ICD-10-CM

## 2023-11-22 MED ORDER — BUSPIRONE HCL 10 MG PO TABS: 10.0000 mg | ORAL_TABLET | Freq: Two times a day (BID) | ORAL | 1 refills | Status: DC

## 2023-11-25 ENCOUNTER — Other Ambulatory Visit: Payer: Self-pay | Admitting: Family

## 2023-11-25 DIAGNOSIS — F418 Other specified anxiety disorders: Secondary | ICD-10-CM

## 2023-11-25 DIAGNOSIS — E782 Mixed hyperlipidemia: Secondary | ICD-10-CM

## 2023-12-01 ENCOUNTER — Encounter: Payer: Self-pay | Admitting: *Deleted

## 2023-12-29 DIAGNOSIS — F331 Major depressive disorder, recurrent, moderate: Secondary | ICD-10-CM | POA: Diagnosis not present

## 2023-12-29 DIAGNOSIS — F411 Generalized anxiety disorder: Secondary | ICD-10-CM | POA: Diagnosis not present

## 2024-01-14 DIAGNOSIS — F411 Generalized anxiety disorder: Secondary | ICD-10-CM | POA: Diagnosis not present

## 2024-01-19 DIAGNOSIS — F411 Generalized anxiety disorder: Secondary | ICD-10-CM | POA: Diagnosis not present

## 2024-01-26 DIAGNOSIS — F411 Generalized anxiety disorder: Secondary | ICD-10-CM | POA: Diagnosis not present

## 2024-01-31 ENCOUNTER — Other Ambulatory Visit: Payer: Self-pay | Admitting: Family

## 2024-01-31 DIAGNOSIS — E781 Pure hyperglyceridemia: Secondary | ICD-10-CM

## 2024-02-02 DIAGNOSIS — F411 Generalized anxiety disorder: Secondary | ICD-10-CM | POA: Diagnosis not present

## 2024-02-09 DIAGNOSIS — F411 Generalized anxiety disorder: Secondary | ICD-10-CM | POA: Diagnosis not present

## 2024-02-12 ENCOUNTER — Other Ambulatory Visit: Payer: Self-pay | Admitting: Family

## 2024-02-12 DIAGNOSIS — E1140 Type 2 diabetes mellitus with diabetic neuropathy, unspecified: Secondary | ICD-10-CM

## 2024-02-16 DIAGNOSIS — F320 Major depressive disorder, single episode, mild: Secondary | ICD-10-CM | POA: Diagnosis not present

## 2024-02-16 DIAGNOSIS — F411 Generalized anxiety disorder: Secondary | ICD-10-CM | POA: Diagnosis not present

## 2024-02-22 DIAGNOSIS — E559 Vitamin D deficiency, unspecified: Secondary | ICD-10-CM | POA: Diagnosis not present

## 2024-02-22 DIAGNOSIS — R809 Proteinuria, unspecified: Secondary | ICD-10-CM | POA: Diagnosis not present

## 2024-02-22 DIAGNOSIS — E1165 Type 2 diabetes mellitus with hyperglycemia: Secondary | ICD-10-CM | POA: Diagnosis not present

## 2024-02-23 DIAGNOSIS — F411 Generalized anxiety disorder: Secondary | ICD-10-CM | POA: Diagnosis not present

## 2024-02-23 DIAGNOSIS — F320 Major depressive disorder, single episode, mild: Secondary | ICD-10-CM | POA: Diagnosis not present

## 2024-02-28 ENCOUNTER — Ambulatory Visit: Admitting: Dermatology

## 2024-02-28 DIAGNOSIS — D2271 Melanocytic nevi of right lower limb, including hip: Secondary | ICD-10-CM

## 2024-02-28 DIAGNOSIS — D2371 Other benign neoplasm of skin of right lower limb, including hip: Secondary | ICD-10-CM | POA: Diagnosis not present

## 2024-02-28 DIAGNOSIS — Z1283 Encounter for screening for malignant neoplasm of skin: Secondary | ICD-10-CM | POA: Diagnosis not present

## 2024-02-28 DIAGNOSIS — B079 Viral wart, unspecified: Secondary | ICD-10-CM

## 2024-02-28 DIAGNOSIS — D229 Melanocytic nevi, unspecified: Secondary | ICD-10-CM

## 2024-02-28 DIAGNOSIS — L814 Other melanin hyperpigmentation: Secondary | ICD-10-CM

## 2024-02-28 DIAGNOSIS — D485 Neoplasm of uncertain behavior of skin: Secondary | ICD-10-CM

## 2024-02-28 DIAGNOSIS — L578 Other skin changes due to chronic exposure to nonionizing radiation: Secondary | ICD-10-CM | POA: Diagnosis not present

## 2024-02-28 DIAGNOSIS — Z79899 Other long term (current) drug therapy: Secondary | ICD-10-CM

## 2024-02-28 DIAGNOSIS — L2089 Other atopic dermatitis: Secondary | ICD-10-CM | POA: Diagnosis not present

## 2024-02-28 DIAGNOSIS — Z7189 Other specified counseling: Secondary | ICD-10-CM

## 2024-02-28 DIAGNOSIS — Z86018 Personal history of other benign neoplasm: Secondary | ICD-10-CM | POA: Diagnosis not present

## 2024-02-28 DIAGNOSIS — W908XXA Exposure to other nonionizing radiation, initial encounter: Secondary | ICD-10-CM

## 2024-02-28 DIAGNOSIS — L821 Other seborrheic keratosis: Secondary | ICD-10-CM | POA: Diagnosis not present

## 2024-02-28 DIAGNOSIS — D225 Melanocytic nevi of trunk: Secondary | ICD-10-CM | POA: Diagnosis not present

## 2024-02-28 NOTE — Patient Instructions (Signed)

## 2024-02-28 NOTE — Progress Notes (Unsigned)
 Follow-Up Visit   Subjective  Deborah Pierce is a 33 y.o. female who presents for the following: Skin Cancer Screening and Full Body Skin Exam  The patient presents for Total-Body Skin Exam (TBSE) for skin cancer screening and mole check. The patient has spots, moles and lesions to be evaluated, some may be new or changing and the patient may have concern these could be cancer.  The following portions of the chart were reviewed this encounter and updated as appropriate: medications, allergies, medical history  Review of Systems:  No other skin or systemic complaints except as noted in HPI or Assessment and Plan.  Objective  Well appearing patient in no apparent distress; mood and affect are within normal limits.  A full examination was performed including scalp, head, eyes, ears, nose, lips, neck, chest, axillae, abdomen, back, buttocks, bilateral upper extremities, bilateral lower extremities, hands, feet, fingers, toes, fingernails, and toenails. All findings within normal limits unless otherwise noted below.   Relevant physical exam findings are noted in the Assessment and Plan.  R palm x 1 Verrucous papules -- Discussed viral etiology and contagion.  R lat thigh 0.6 cm flesh colored papule.  L mid side 0.6 cm irregular brown macule.  R foot middle medial sole 0.6 cm irregular brown macule.   Assessment & Plan   SKIN CANCER SCREENING PERFORMED TODAY.  ACTINIC DAMAGE - Chronic condition, secondary to cumulative UV/sun exposure - diffuse scaly erythematous macules with underlying dyspigmentation - Recommend daily broad spectrum sunscreen SPF 30+ to sun-exposed areas, reapply every 2 hours as needed.  - Staying in the shade or wearing long sleeves, sun glasses (UVA+UVB protection) and wide brim hats (4-inch brim around the entire circumference of the hat) are also recommended for sun protection.  - Call for new or changing lesions.  LENTIGINES, SEBORRHEIC KERATOSES,  HEMANGIOMAS - Benign normal skin lesions - Benign-appearing - Call for any changes  MELANOCYTIC NEVI - Tan-brown and/or pink-flesh-colored symmetric macules and papules - Benign appearing on exam today - Observation - Call clinic for new or changing moles - Recommend daily use of broad spectrum spf 30+ sunscreen to sun-exposed areas.   HISTORY OF DYSPLASTIC NEVI No evidence of recurrence today Recommend regular full body skin exams Recommend daily broad spectrum sunscreen SPF 30+ to sun-exposed areas, reapply every 2 hours as needed.  Call if any new or changing lesions are noted between office visits  ATOPIC DERMATITIS Exam: Scaly pink papules coalescing to plaques 5% BSA Chronic and persistent condition with duration or expected duration over one year. Condition is bothersome/symptomatic for patient. Currently flared. Atopic dermatitis (eczema) is a chronic, relapsing, pruritic condition that can significantly affect quality of life. It is often associated with allergic rhinitis and/or asthma and can require treatment with topical medications, phototherapy, or in severe cases biologic injectable medication (Dupixent; Adbry) or Oral JAK inhibitors. Treatment Plan: Start Mometasone 0.1% cream to aa's foot BID PRN. Recommend gentle skin care.  VIRAL WARTS, UNSPECIFIED TYPE R palm x 1 Viral Wart (HPV) Counseling  Discussed viral / HPV (Human Papilloma Virus) etiology and risk of spread /infectivity to other areas of body as well as to other people.  Multiple treatments and methods may be required to clear warts and it is possible treatment may not be successful.  Treatment risks include discoloration; scarring and there is still potential for wart recurrence. Destruction of lesion - R palm x 1 Complexity: simple   Destruction method: cryotherapy   Informed consent: discussed and consent  obtained   Timeout:  patient name, date of birth, surgical site, and procedure verified Lesion  destroyed using liquid nitrogen: Yes   Region frozen until ice ball extended beyond lesion: Yes   Outcome: patient tolerated procedure well with no complications   Post-procedure details: wound care instructions given    NEOPLASM OF UNCERTAIN BEHAVIOR OF SKIN (3) R lat thigh Epidermal / dermal shaving  Lesion diameter (cm):  0.6 Informed consent: discussed and consent obtained   Timeout: patient name, date of birth, surgical site, and procedure verified   Procedure prep:  Patient was prepped and draped in usual sterile fashion Prep type:  Isopropyl alcohol Anesthesia: the lesion was anesthetized in a standard fashion   Anesthetic:  1% lidocaine  w/ epinephrine  1-100,000 buffered w/ 8.4% NaHCO3 Instrument used: flexible razor blade   Hemostasis achieved with: pressure, aluminum chloride and electrodesiccation   Outcome: patient tolerated procedure well   Post-procedure details: sterile dressing applied and wound care instructions given   Dressing type: bandage (Mupirocin  2% ointment)    Specimen 1 - Surgical pathology Differential Diagnosis: D48.5 r/o pyogenic granuloma vs other Check Margins: Yes L mid side Epidermal / dermal shaving  Lesion diameter (cm):  0.6 Informed consent: discussed and consent obtained   Timeout: patient name, date of birth, surgical site, and procedure verified   Procedure prep:  Patient was prepped and draped in usual sterile fashion Prep type:  Isopropyl alcohol Anesthesia: the lesion was anesthetized in a standard fashion   Anesthetic:  1% lidocaine  w/ epinephrine  1-100,000 buffered w/ 8.4% NaHCO3 Instrument used: flexible razor blade   Hemostasis achieved with: pressure, aluminum chloride and electrodesiccation   Outcome: patient tolerated procedure well   Post-procedure details: sterile dressing applied and wound care instructions given   Dressing type: bandage (Mupirocin  2% ointment)    Specimen 2 - Surgical pathology Differential Diagnosis:  D48.5 r/o dysplastic nevus Check Margins: Yes R foot middle medial sole Epidermal / dermal shaving  Lesion diameter (cm):  0.6 Informed consent: discussed and consent obtained   Timeout: patient name, date of birth, surgical site, and procedure verified   Procedure prep:  Patient was prepped and draped in usual sterile fashion Prep type:  Isopropyl alcohol Anesthesia: the lesion was anesthetized in a standard fashion   Anesthetic:  1% lidocaine  w/ epinephrine  1-100,000 buffered w/ 8.4% NaHCO3 Instrument used: flexible razor blade   Hemostasis achieved with: pressure, aluminum chloride and electrodesiccation   Outcome: patient tolerated procedure well   Post-procedure details: sterile dressing applied and wound care instructions given   Dressing type: bandage (Mupirocin  2% ointment)    Specimen 3 - Surgical pathology Differential Diagnosis: D48.5 r/o dysplastic nevus Check Margins: Yes  Return in about 1 year (around 02/27/2025) for TBSE.  LILLETTE Rosina Mayans, CMA, am acting as scribe for Alm Rhyme, MD .   Documentation: I have reviewed the above documentation for accuracy and completeness, and I agree with the above.  Alm Rhyme, MD

## 2024-02-29 ENCOUNTER — Encounter: Payer: Self-pay | Admitting: Dermatology

## 2024-03-01 ENCOUNTER — Encounter: Payer: Self-pay | Admitting: Dermatology

## 2024-03-01 DIAGNOSIS — F320 Major depressive disorder, single episode, mild: Secondary | ICD-10-CM | POA: Diagnosis not present

## 2024-03-01 DIAGNOSIS — F411 Generalized anxiety disorder: Secondary | ICD-10-CM | POA: Diagnosis not present

## 2024-03-01 MED ORDER — MOMETASONE FUROATE 0.1 % EX CREA: TOPICAL_CREAM | CUTANEOUS | 0 refills | Status: DC

## 2024-03-02 ENCOUNTER — Ambulatory Visit: Payer: No Typology Code available for payment source | Admitting: Dermatology

## 2024-03-02 LAB — SURGICAL PATHOLOGY

## 2024-03-03 ENCOUNTER — Ambulatory Visit: Payer: Self-pay | Admitting: Dermatology

## 2024-03-03 DIAGNOSIS — L2089 Other atopic dermatitis: Secondary | ICD-10-CM

## 2024-03-06 ENCOUNTER — Encounter: Payer: Self-pay | Admitting: Dermatology

## 2024-03-06 MED ORDER — MOMETASONE FUROATE 0.1 % EX CREA: TOPICAL_CREAM | CUTANEOUS | 0 refills | Status: AC

## 2024-03-06 NOTE — Telephone Encounter (Addendum)
 Tried calling patient regarding results. No answer. Lm for patient to return call.   ----- Message from Alm Rhyme sent at 03/03/2024 12:20 PM EST ----- FINAL DIAGNOSIS        1. Skin, right lat thigh :       EPITHELIOID FIBROUS HISTIOCYTOMA, BASE INVOLVED        2. Skin, left mid side :       DYSPLASTIC JUNCTIONAL NEVUS WITH MODERATE ATYPIA, LIMITED MARGINS FREE        3. Skin, right foot middle medial sole :       DYSPLASTIC JUNCTIONAL NEVUS WITH MODERATE ATYPIA, CLOSE TO MARGIN    1- Benign Histiocytoma Recheck next visit 2,3 - Both Moderate Dysplastic Recheck next visit  ----- Message ----- From: Interface, Lab In Three Zero One Sent: 03/02/2024   5:39 PM EST To: Alm JAYSON Rhyme, MD

## 2024-03-06 NOTE — Telephone Encounter (Addendum)
 Patient returned call. Discussed bx results with patient. She verbalized understanding and denied further questions.  ----- Message from Alm Rhyme sent at 03/03/2024 12:20 PM EST ----- FINAL DIAGNOSIS        1. Skin, right lat thigh :       EPITHELIOID FIBROUS HISTIOCYTOMA, BASE INVOLVED        2. Skin, left mid side :       DYSPLASTIC JUNCTIONAL NEVUS WITH MODERATE ATYPIA, LIMITED MARGINS FREE        3. Skin, right foot middle medial sole :       DYSPLASTIC JUNCTIONAL NEVUS WITH MODERATE ATYPIA, CLOSE TO MARGIN    1- Benign Histiocytoma Recheck next visit 2,3 - Both Moderate Dysplastic Recheck next visit  ----- Message ----- From: Interface, Lab In Three Zero One Sent: 03/02/2024   5:39 PM EST To: Alm JAYSON Rhyme, MD

## 2024-03-06 NOTE — Addendum Note (Signed)
 Addended by: TANDA SETTER A on: 03/06/2024 03:54 PM   Modules accepted: Orders

## 2024-03-07 LAB — OPHTHALMOLOGY REPORT-SCANNED

## 2024-03-10 DIAGNOSIS — F4310 Post-traumatic stress disorder, unspecified: Secondary | ICD-10-CM | POA: Diagnosis not present

## 2024-03-10 DIAGNOSIS — F411 Generalized anxiety disorder: Secondary | ICD-10-CM | POA: Diagnosis not present

## 2024-03-10 DIAGNOSIS — F320 Major depressive disorder, single episode, mild: Secondary | ICD-10-CM | POA: Diagnosis not present

## 2024-03-11 DIAGNOSIS — F320 Major depressive disorder, single episode, mild: Secondary | ICD-10-CM | POA: Diagnosis not present

## 2024-03-11 DIAGNOSIS — F411 Generalized anxiety disorder: Secondary | ICD-10-CM | POA: Diagnosis not present

## 2024-03-11 DIAGNOSIS — F4310 Post-traumatic stress disorder, unspecified: Secondary | ICD-10-CM | POA: Diagnosis not present

## 2024-03-16 ENCOUNTER — Other Ambulatory Visit: Payer: Self-pay | Admitting: Family

## 2024-03-16 DIAGNOSIS — F418 Other specified anxiety disorders: Secondary | ICD-10-CM

## 2024-03-18 DIAGNOSIS — F411 Generalized anxiety disorder: Secondary | ICD-10-CM | POA: Diagnosis not present

## 2024-03-18 DIAGNOSIS — F320 Major depressive disorder, single episode, mild: Secondary | ICD-10-CM | POA: Diagnosis not present

## 2024-03-18 DIAGNOSIS — F4310 Post-traumatic stress disorder, unspecified: Secondary | ICD-10-CM | POA: Diagnosis not present

## 2024-03-23 DIAGNOSIS — E669 Obesity, unspecified: Secondary | ICD-10-CM | POA: Diagnosis not present

## 2024-03-23 DIAGNOSIS — R202 Paresthesia of skin: Secondary | ICD-10-CM | POA: Diagnosis not present

## 2024-03-23 DIAGNOSIS — G629 Polyneuropathy, unspecified: Secondary | ICD-10-CM | POA: Diagnosis not present

## 2024-03-23 DIAGNOSIS — R200 Anesthesia of skin: Secondary | ICD-10-CM | POA: Diagnosis not present

## 2024-03-25 DIAGNOSIS — F320 Major depressive disorder, single episode, mild: Secondary | ICD-10-CM | POA: Diagnosis not present

## 2024-03-25 DIAGNOSIS — F411 Generalized anxiety disorder: Secondary | ICD-10-CM | POA: Diagnosis not present

## 2024-03-25 DIAGNOSIS — F4310 Post-traumatic stress disorder, unspecified: Secondary | ICD-10-CM | POA: Diagnosis not present

## 2024-04-14 ENCOUNTER — Ambulatory Visit: Payer: Self-pay

## 2024-04-14 NOTE — Telephone Encounter (Signed)
 FYI Only or Action Required?: FYI only for provider: appointment scheduled on 1/5.  Patient was last seen in primary care on 11/12/2023 by Corwin Antu, FNP.  Called Nurse Triage reporting Ankle Injury.  Symptoms began yesterday.  Interventions attempted: OTC medications: Tylenol .  Symptoms are: gradually worsening.  Triage Disposition: See Physician Within 24 Hours  Patient/caregiver understands and will follow disposition?: Yes       Copied from CRM #[id number]. Topic: Clinical - Red Word Triage >> Apr 14, 2024  9:11 AM Laymon HERO wrote: Red Word that prompted transfer to Nurse Triage: Rolled ankle on 1/1- swollen and pain is going down to foot. Can put pressure on foot but is limping. Reason for Disposition  A snap or pop was heard at the time of injury  Answer Assessment - Initial Assessment Questions 1. MECHANISM: How did the injury happen? (e.g., twisting injury, direct blow)      Grabbing something off front porch  2. ONSET: When did the injury happen? (e.g., minutes or hours ago)      Yesterday   3. LOCATION: Where is the injury located?      Right ankle   4. APPEARANCE of INJURY: What does the injury look like?      Swelling noted on bottom of foot   5. WEIGHT-BEARING: Can you put weight on that foot? Can you walk (four steps or more)?       Yes, can bear weight   6. SIZE: For cuts, bruises, or swelling, ask: How large is it? (e.g., inches or centimeters; entire joint)      Mild swelling noted.  7. PAIN: Is there pain? If Yes, ask: How bad is the pain?  What does it keep you from doing? (Scale 0-10; or none, mild, moderate, severe)     Moderate    9. OTHER SYMPTOMS: Do you have any other symptoms?      Has cold symptoms x 3 days-no fever noted   10. PREGNANCY: Is there any chance you are pregnant? When was your last menstrual period?       No       Patient called in to triage with complaints of twisting ankle right side  while grabbing object off of porch.  This has been ongoing since yesterday. The patient stated she heard a pop sound. She can still bear weight. For home care, the patient is taking OTC Tylenol   Soonest avail. Appointment scheduled for 1/5 for further evaluation; Patient agrees with the plan of care, and will be seen in UC/ED if symptoms-pain worsen over the weekend.  Protocols used: Ankle Injury-A-AH

## 2024-04-14 NOTE — Telephone Encounter (Signed)
 NOTED

## 2024-04-15 ENCOUNTER — Ambulatory Visit (INDEPENDENT_AMBULATORY_CARE_PROVIDER_SITE_OTHER)

## 2024-04-15 ENCOUNTER — Encounter (HOSPITAL_COMMUNITY): Payer: Self-pay

## 2024-04-15 ENCOUNTER — Ambulatory Visit (HOSPITAL_COMMUNITY)
Admission: RE | Admit: 2024-04-15 | Discharge: 2024-04-15 | Disposition: A | Discharge status: Home / Self Care | Source: Ambulatory Visit | Attending: Emergency Medicine | Admitting: Emergency Medicine

## 2024-04-15 VITALS — BP 137/81 | HR 88 | Temp 98.4°F | Resp 18

## 2024-04-15 DIAGNOSIS — S8991XA Unspecified injury of right lower leg, initial encounter: Secondary | ICD-10-CM

## 2024-04-15 DIAGNOSIS — S93491A Sprain of other ligament of right ankle, initial encounter: Secondary | ICD-10-CM

## 2024-04-15 DIAGNOSIS — J069 Acute upper respiratory infection, unspecified: Secondary | ICD-10-CM

## 2024-04-15 NOTE — Discharge Instructions (Signed)
 X-ray did not reveal any fractures or bony abnormalities of your knee or ankle.  Wear the knee sleeve to help provide compression and support.  Wear the ankle brace to help provide compression and support as well.  Continue to stay mobile.  You can elevate and ice to help with swelling.  For pain you can alternate between 800 mg of ibuprofen  and 500 mg of Tylenol  every 4-6 hours.  For any continued concerns with the ankle or knee please follow-up with sports medicine.  To help with congestion use over-the-counter Flonase, you can use this twice daily.  Sudafed can help with congestion as well, this is available over-the-counter.  Typical viral illnesses last around 5 to 7 days.  For any prolonged symptoms or new concerning symptoms seek follow-up care.

## 2024-04-15 NOTE — ED Provider Notes (Signed)
 " [facility]    CSN: [id number] Arrival date & time: 04/15/24  9182      History   Chief Complaint Chief Complaint  Patient presents with   Fall   Ankle Injury    HPI Deborah Pierce is a 34 y.o. female.   Patient presents to clinic over concern of right knee pain, right ankle pain and viral URI symptoms.  On 12/25 she had a fall on Christmas where her left leg went out in front of her and her right leg went back behind her and she landed on her knee.  She has a healing scab to the right knee that she has been placing a dressing and Vaseline on.  Continues to have right knee pain.  Was icing this and thought it was getting better until she had a fall on 1/1.  In this fall she twisted her right ankle inward, is unsure if the fall occurred due to her knee feeling unstable.  Since then her right ankle has been painful, bruised and swollen.  She has remained ambulatory the entire time.  She has been wearing an ankle brace at home and taking Aleve .  Her child was diagnosed with RSV last week and for the past 6 days she has had cough, nasal congestion and some bilateral ear itching.  She has not had any fevers.  Has not had wheezing or shortness of breath.  Has been taking Tylenol  Sinus which seems to be helping.   The history is provided by the patient and medical records.  Fall  Ankle Injury    Past Medical History:  Diagnosis Date   Anxiety    Depression    Diabetes mellitus without complication (HCC)    Dysplastic nevus 02/20/2022   Severe, R to mid upper back paraspinal, Excised 07/21/22   Dysplastic nevus 02/20/2022   Severe, L mid back paraspinal, Excised 08/18/22   Dysplastic nevus 02/20/2022   mild, L lateral buttocks   Dysplastic nevus 02/28/2024   left mid side - moderate - recheck at next followup   Dysplastic nevus 02/28/2024   right foot middle medial sole - moderate - recheck at next followup   Fractured elbow 2022   rt   Headache    Other fatigue  08/12/2021   Pregnancy induced hypertension 05/11/2022   Pt states they just noticed about a week ago.   Seasonal allergies 08/07/2011   UTI (urinary tract infection)     Patient Active Problem List   Diagnosis Date Noted   Microalbuminuria 11/15/2023   Peripheral neuropathy 02/04/2023   Type 2 diabetes mellitus with diabetic neuropathy, with long-term current use of insulin  (HCC) 02/04/2023   Retrolisthesis of vertebrae 02/04/2023   Glucosuria 12/30/2022   Class 1 obesity due to excess calories with serious comorbidity and body mass index (BMI) of 34.0 to 34.9 in adult 10/26/2022   Dysplastic nevus 04/16/2022   Mixed hyperlipidemia 12/17/2021   Vitamin D  deficiency 12/17/2021   Controlled type 2 diabetes mellitus without complication, with long-term current use of insulin  (HCC) 08/12/2021   Diabetic peripheral neuropathy (HCC) 08/12/2021   Depression with anxiety 08/12/2021    Past Surgical History:  Procedure Laterality Date   CESAREAN SECTION N/A 05/29/2022   Procedure: CESAREAN SECTION;  Surgeon: Kandis Devaughn Sayres, MD;  Location: [facility];  Service: Obstetrics;  Laterality: N/A;   LAPAROSCOPY     looking for endometriosis    OB History     Gravida  2  Para  1   Term  0   Preterm  1   AB  1   Living  1      SAB  1   IAB      Ectopic      Multiple  0   Live Births  1        Obstetric Comments  March 2022 spontaneous abortion/miscarriage          Home Medications    Prior to Admission medications  Medication Sig Start Date End Date Taking? Authorizing Provider  busPIRone  (BUSPAR ) 10 MG tablet TAKE 1 TABLET BY MOUTH TWICE A DAY 03/16/24  Yes Dugal, Tabitha, FNP  cetirizine (ZYRTEC ALLERGY) 10 MG tablet 1 tablet as needed Orally Once a day   Yes [provider]  FLUoxetine  (PROZAC ) 40 MG capsule TAKE 1 CAPSULE (40 MG TOTAL) BY MOUTH DAILY. 11/25/23  Yes Dugal, Ginger, FNP  losartan  (COZAAR ) 25 MG tablet TAKE 1/2 TABLET BY MOUTH ONCE  DAILY 02/14/24  Yes Dugal, Tabitha, FNP  metFORMIN  (GLUCOPHAGE ) 1000 MG tablet TAKE 1 TABLET (1,000 MG TOTAL) BY MOUTH TWICE A DAY WITH FOOD 07/20/22  Yes Anyanwu, Ugonna A, MD  NIFEdipine  (PROCARDIA -XL/NIFEDICAL-XL) 30 MG 24 hr tablet TAKE 1 TABLET BY MOUTH EVERY DAY 05/19/23  Yes Dugal, Tabitha, FNP  norethindrone  (MICRONOR ) 0.35 MG tablet TAKE 1 TABLET BY MOUTH EVERY DAY 06/15/23  Yes Anyanwu, Ugonna A, MD  NOVOLOG  100 UNIT/ML injection use up to 100 units daily subcutaneously daily in the pump for 30 days Ok to sub Humalog if insurance prefers 02/04/22  Yes [provider]  omega-3 acid ethyl esters (LOVAZA ) 1 g capsule TAKE 2 CAPSULES BY MOUTH 2 TIMES DAILY. 02/02/24  Yes Dugal, Ginger, FNP  pravastatin  (PRAVACHOL ) 10 MG tablet TAKE 1 TABLET BY MOUTH EVERY DAY 11/25/23  Yes Dugal, Tabitha, FNP  pregabalin (LYRICA) 75 MG capsule Take 75 mg by mouth. 11/09/23  Yes [provider]  TRULICITY 0.75 MG/0.5ML SOAJ Inject 0.75 mg into the skin once a week. 06/30/23  Yes [provider]  Continuous Blood Gluc Sensor (DEXCOM G6 SENSOR) MISC Apply topically as directed. 06/27/22   [provider]  Continuous Blood Gluc Transmit (DEXCOM G6 TRANSMITTER) MISC USE 1 TRANSMITTER TO MONITOR BLOOD SUGARS EVERY 3 MONTHS 90 DAYS    [provider]  gabapentin  (NEURONTIN ) 300 MG capsule Take one tablet in am (300 mg) and two tablets pm (600 mg) 02/04/23   Dugal, Tabitha, FNP  Insulin  Disposable Pump (OMNIPOD 5 G6 PODS, GEN 5,) MISC SMARTSIG:SUB-Q Every Other Day    [provider]  mometasone  (ELOCON ) 0.1 % cream Apply to aa's rash BID PRN. 03/06/24   Hester Alm BROCKS, MD  tiZANidine  (ZANAFLEX ) 4 MG tablet TAKE 1/2 TO ONE TABLET BY MOUTH AT BEDTIME AS NEEDED MUSCLE SPASM 09/13/23   Corwin Ginger, FNP    Family History Family History  Problem Relation Age of Onset   Hypertension Mother    Heart disease Mother    Skin cancer Mother        melanoma   Diabetes Mother     Diabetes type II Mother    Melanoma Mother    Diabetes Sister    Heart disease Maternal Grandmother    Hypertension Maternal Grandmother    Diabetes Maternal Grandmother    Lung cancer Maternal Grandfather    Brain cancer Maternal Grandfather    Asthma Neg Hx     Social History Social History[1]  Allergies   Fruit extracts, Insulins, Sertraline, Tree extract, Bee venom, Insulin  degludec-liraglutide, Insulin  glargine-lixisenatide, Penicillins, and Sulfa antibiotics   Review of Systems Review of Systems  Per HPI  Physical Exam Triage Vital Signs ED Triage Vitals  Encounter Vitals Group     BP 04/15/24 0842 137/81     Girls Systolic BP Percentile --      Girls Diastolic BP Percentile --      Boys Systolic BP Percentile --      Boys Diastolic BP Percentile --      Pulse Rate 04/15/24 0842 88     Resp 04/15/24 0842 18     Temp 04/15/24 0842 98.4 F (36.9 C)     Temp Source 04/15/24 0842 Oral     SpO2 04/15/24 0842 96 %     Weight --      Height --      Head Circumference --      Peak Flow --      Pain Score 04/15/24 0840 4     Pain Loc --      Pain Education --      Exclude from Growth Chart --    No data found.  Updated Vital Signs BP 137/81 (BP Location: Left Arm)   Pulse 88   Temp 98.4 F (36.9 C) (Oral)   Resp 18   LMP 03/30/2024 (Exact Date)   SpO2 96%   Breastfeeding No   Visual Acuity Right Eye Distance:   Left Eye Distance:   Bilateral Distance:    Right Eye Near:   Left Eye Near:    Bilateral Near:     Physical Exam Vitals and nursing note reviewed.  Constitutional:      Appearance: Normal appearance.  HENT:     Head: Normocephalic and atraumatic.     Right Ear: Tympanic membrane, ear canal and external ear normal.     Left Ear: Tympanic membrane, ear canal and external ear normal.     Nose: Congestion present.     Mouth/Throat:     Mouth: Mucous membranes are moist.  Eyes:     Conjunctiva/sclera: Conjunctivae normal.   Cardiovascular:     Rate and Rhythm: Normal rate and regular rhythm.     Pulses:          Dorsalis pedis pulses are 2+ on the right side.       Posterior tibial pulses are 2+ on the right side.     Heart sounds: Normal heart sounds. No murmur heard. Pulmonary:     Effort: Pulmonary effort is normal. No respiratory distress.     Breath sounds: Normal breath sounds. No wheezing.  Musculoskeletal:        General: Swelling, tenderness and signs of injury present. No deformity. Normal range of motion.     Right knee: Normal range of motion. Normal alignment.     Instability Tests: Anterior drawer test negative. Posterior drawer test negative.     Right foot: Normal range of motion.       Legs:       Feet:     Comments: Right knee with full range of motion.  Mild tenderness over scabbed area.  Negative anterior and posterior drawer.  Feet:     Right foot:     Skin integrity: Skin integrity normal.  Skin:    General: Skin is warm and dry.     Findings: Bruising present.  Neurological:     General: No focal deficit present.  Mental Status: She is alert.  Psychiatric:        Mood and Affect: Mood normal.        Behavior: Behavior is cooperative.      UC Treatments / Results  Labs (all labs ordered are listed, but only abnormal results are displayed) Labs Reviewed - No data to display  EKG   Radiology DG Ankle Complete Right Result Date: 04/15/2024 EXAM: 3 OR MORE VIEW(S) XRAY OF THE RIGHT ANKLE 04/15/2024 09:07:06 AM CLINICAL HISTORY: fall COMPARISON: None available. FINDINGS: BONES AND JOINTS: No acute fracture. No dislocation. No malalignment. SOFT TISSUES: Soft tissue swelling about the lateral malleolus. IMPRESSION: 1. No acute fracture or dislocation. 2. Soft tissue swelling about the lateral malleolus. Electronically signed by: Norman Gatlin MD 04/15/2024 09:10 AM EST RP Workstation: [id number]   DG Knee Complete 4 Views Right Result Date: 04/15/2024 EXAM: 4 OR MORE  VIEW(S) XRAY OF THE RIGHT KNEE 04/15/2024 09:07:06 AM COMPARISON: None available. CLINICAL HISTORY: fall FINDINGS: BONES AND JOINTS: No acute fracture. No malalignment. No significant joint effusion. SOFT TISSUES: The soft tissues are unremarkable. IMPRESSION: 1. No significant abnormality. Electronically signed by: Norman Gatlin MD 04/15/2024 09:09 AM EST RP Workstation: [id number]    Procedures Procedures (including critical care time)  Medications Ordered in UC Medications - No data to display  Initial Impression / Assessment and Plan / UC Course  I have reviewed the triage vital signs and the nursing notes.  Pertinent labs & imaging results that were available during my care of the patient were reviewed by me and considered in my medical decision making (see chart for details).  Vitals and triage reviewed, patient is hemodynamically stable.  Lungs vesicular, heart with regular rate and rhythm.  Imaging by my interpretation does not show acute bony abnormality on knee or ankle films.  Does show soft tissue swelling, radiology overread agreeable with this interpretation.  Will provide with knee sleeve for stability and support.  Encouraged follow-up with orthopedics if knee instability persist.  Symptomatic management for ankle sprain discussed.  Suspect viral URI with recent exposure to RSV and symptomatic management with Flonase and decongestions encouraged.  Plan of care, follow-up care return precautions given, no questions at this time.    Final Clinical Impressions(s) / UC Diagnoses   Final diagnoses:  Injury of right knee, initial encounter  Sprain of anterior talofibular ligament of right ankle, initial encounter  Viral URI with cough     Discharge Instructions      X-ray did not reveal any fractures or bony abnormalities of your knee or ankle.  Wear the knee sleeve to help provide compression and support.  Wear the ankle brace to help provide compression and support  as well.  Continue to stay mobile.  You can elevate and ice to help with swelling.  For pain you can alternate between 800 mg of ibuprofen  and 500 mg of Tylenol  every 4-6 hours.  For any continued concerns with the ankle or knee please follow-up with sports medicine.  To help with congestion use over-the-counter Flonase, you can use this twice daily.  Sudafed can help with congestion as well, this is available over-the-counter.  Typical viral illnesses last around 5 to 7 days.  For any prolonged symptoms or new concerning symptoms seek follow-up care.      ED Prescriptions   None    PDMP not reviewed this encounter.     [1]  Social History Tobacco Use   Smoking status: Never  Smokeless tobacco: Never  Vaping Use   Vaping status: Never Used  Substance Use Topics   Alcohol use: Not Currently   Drug use: Not Currently    Types: Marijuana     Dreama Estel SAILOR, FNP 04/15/24 0932  "

## 2024-04-15 NOTE — ED Triage Notes (Signed)
 Pt states she fell off porch on Wednesday twisted her right ankle she is still having pain and swelling.   She also fell on christmas and hurt her right knee there is a healing wound there. She states still painful.   She has been taking tylenol  and aleve .   She also would like someone to check her ears they are itchy.

## 2024-04-17 ENCOUNTER — Ambulatory Visit: Admitting: Family Medicine

## 2025-02-27 ENCOUNTER — Ambulatory Visit: Admitting: Dermatology
